# Patient Record
Sex: Female | Born: 1965 | Race: White | Hispanic: No | Marital: Married | Smoking: Never smoker
Health system: Southern US, Community
[De-identification: ages and names within clinical notes are randomized; demographics above are authoritative.]

## PROBLEM LIST (undated history)

## (undated) DIAGNOSIS — Z9889 Other specified postprocedural states: Secondary | ICD-10-CM

## (undated) DIAGNOSIS — I469 Cardiac arrest, cause unspecified: Secondary | ICD-10-CM

## (undated) DIAGNOSIS — R9431 Abnormal electrocardiogram [ECG] [EKG]: Secondary | ICD-10-CM

## (undated) DIAGNOSIS — K621 Rectal polyp: Secondary | ICD-10-CM

## (undated) DIAGNOSIS — R112 Nausea with vomiting, unspecified: Secondary | ICD-10-CM

## (undated) DIAGNOSIS — R011 Cardiac murmur, unspecified: Secondary | ICD-10-CM

## (undated) DIAGNOSIS — G44219 Episodic tension-type headache, not intractable: Secondary | ICD-10-CM

## (undated) HISTORY — DX: Cardiac arrest, cause unspecified: I46.9

## (undated) HISTORY — DX: Abnormal electrocardiogram (ECG) (EKG): R94.31

## (undated) HISTORY — DX: Episodic tension-type headache, not intractable: G44.219

---

## 1997-07-27 HISTORY — PX: BREAST BIOPSY: SHX20

## 1998-04-04 ENCOUNTER — Other Ambulatory Visit: Admission: RE | Admit: 1998-04-04 | Discharge: 1998-04-04 | Payer: Self-pay | Admitting: Obstetrics and Gynecology

## 1999-06-05 ENCOUNTER — Other Ambulatory Visit: Admission: RE | Admit: 1999-06-05 | Discharge: 1999-06-05 | Payer: Self-pay | Admitting: Obstetrics and Gynecology

## 1999-07-18 ENCOUNTER — Ambulatory Visit (HOSPITAL_COMMUNITY): Admission: RE | Admit: 1999-07-18 | Discharge: 1999-07-18 | Payer: Self-pay | Admitting: *Deleted

## 1999-07-18 ENCOUNTER — Encounter: Payer: Self-pay | Admitting: General Surgery

## 2000-06-14 ENCOUNTER — Other Ambulatory Visit: Admission: RE | Admit: 2000-06-14 | Discharge: 2000-06-14 | Payer: Self-pay | Admitting: Obstetrics and Gynecology

## 2000-12-27 ENCOUNTER — Emergency Department (HOSPITAL_COMMUNITY): Admission: EM | Admit: 2000-12-27 | Discharge: 2000-12-27 | Payer: Self-pay | Admitting: Emergency Medicine

## 2001-07-28 ENCOUNTER — Other Ambulatory Visit: Admission: RE | Admit: 2001-07-28 | Discharge: 2001-07-28 | Payer: Self-pay | Admitting: Obstetrics and Gynecology

## 2002-08-08 ENCOUNTER — Other Ambulatory Visit: Admission: RE | Admit: 2002-08-08 | Discharge: 2002-08-08 | Payer: Self-pay | Admitting: Obstetrics and Gynecology

## 2003-10-24 ENCOUNTER — Ambulatory Visit (HOSPITAL_COMMUNITY): Admission: RE | Admit: 2003-10-24 | Discharge: 2003-10-24 | Payer: Self-pay | Admitting: Obstetrics and Gynecology

## 2004-01-09 ENCOUNTER — Inpatient Hospital Stay (HOSPITAL_COMMUNITY): Admission: AD | Admit: 2004-01-09 | Discharge: 2004-01-11 | Payer: Self-pay | Admitting: Obstetrics and Gynecology

## 2004-02-18 ENCOUNTER — Other Ambulatory Visit: Admission: RE | Admit: 2004-02-18 | Discharge: 2004-02-18 | Payer: Self-pay | Admitting: Obstetrics and Gynecology

## 2008-11-24 LAB — HM PAP SMEAR

## 2008-11-24 LAB — HM MAMMOGRAPHY

## 2012-04-19 ENCOUNTER — Ambulatory Visit (INDEPENDENT_AMBULATORY_CARE_PROVIDER_SITE_OTHER): Payer: BC Managed Care – PPO | Admitting: Family Medicine

## 2012-04-19 ENCOUNTER — Encounter: Payer: Self-pay | Admitting: Family Medicine

## 2012-04-19 VITALS — BP 120/82 | HR 70 | Temp 98.6°F | Ht 62.0 in | Wt 152.0 lb

## 2012-04-19 DIAGNOSIS — Z0000 Encounter for general adult medical examination without abnormal findings: Secondary | ICD-10-CM

## 2012-04-19 DIAGNOSIS — Z23 Encounter for immunization: Secondary | ICD-10-CM

## 2012-04-19 DIAGNOSIS — Z8249 Family history of ischemic heart disease and other diseases of the circulatory system: Secondary | ICD-10-CM

## 2012-04-19 DIAGNOSIS — E785 Hyperlipidemia, unspecified: Secondary | ICD-10-CM

## 2012-04-19 LAB — POCT URINALYSIS DIPSTICK
Bilirubin, UA: NEGATIVE
Blood, UA: NEGATIVE
Glucose, UA: NEGATIVE
Ketones, UA: NEGATIVE
Leukocytes, UA: NEGATIVE
Nitrite, UA: NEGATIVE
Spec Grav, UA: >=1.03
Urobilinogen, UA: 0.2
pH, UA: 6.5

## 2012-04-19 LAB — BASIC METABOLIC PANEL
BUN: 12 mg/dL (ref 6–23)
CO2: 26 mEq/L (ref 19–32)
Calcium: 9.3 mg/dL (ref 8.4–10.5)
Chloride: 102 mEq/L (ref 96–112)
Creatinine, Ser: 0.8 mg/dL (ref 0.4–1.2)
GFR: 85.75 mL/min (ref >60.00)
Glucose, Bld: 89 mg/dL (ref 70–99)
Potassium: 4.1 mEq/L (ref 3.5–5.1)
Sodium: 141 mEq/L (ref 135–145)

## 2012-04-19 LAB — HEPATIC FUNCTION PANEL
ALT: 21 U/L (ref 0–35)
AST: 21 U/L (ref 0–37)
Albumin: 4.2 g/dL (ref 3.5–5.2)
Alkaline Phosphatase: 76 U/L (ref 39–117)
Bilirubin, Direct: 0 mg/dL (ref 0.0–0.3)
Total Bilirubin: 0.6 mg/dL (ref 0.3–1.2)
Total Protein: 7.1 g/dL (ref 6.0–8.3)

## 2012-04-19 LAB — CBC WITH DIFFERENTIAL/PLATELET
Basophils Absolute: 0.1 10*3/uL (ref 0.0–0.1)
Basophils Relative: 0.7 % (ref 0.0–3.0)
Eosinophils Absolute: 0.2 10*3/uL (ref 0.0–0.7)
Eosinophils Relative: 2.6 % (ref 0.0–5.0)
HCT: 43.3 % (ref 36.0–46.0)
Hemoglobin: 14.1 g/dL (ref 12.0–15.0)
Lymphocytes Relative: 31 % (ref 12.0–46.0)
Lymphs Abs: 2.2 10*3/uL (ref 0.7–4.0)
MCHC: 32.6 g/dL (ref 30.0–36.0)
MCV: 84.3 fl (ref 78.0–100.0)
Monocytes Absolute: 0.5 10*3/uL (ref 0.1–1.0)
Monocytes Relative: 6.8 % (ref 3.0–12.0)
Neutro Abs: 4.1 10*3/uL (ref 1.4–7.7)
Neutrophils Relative %: 58.9 % (ref 43.0–77.0)
Platelets: 252 10*3/uL (ref 150.0–400.0)
RBC: 5.14 Mil/uL — ABNORMAL HIGH (ref 3.87–5.11)
RDW: 13.9 % (ref 11.5–14.6)
WBC: 7 10*3/uL (ref 4.5–10.5)

## 2012-04-19 LAB — LIPID PANEL
Cholesterol: 183 mg/dL (ref 0–200)
HDL: 34.3 mg/dL — ABNORMAL LOW (ref >39.00)
LDL Cholesterol: 126 mg/dL — ABNORMAL HIGH (ref 0–99)
Total CHOL/HDL Ratio: 5
Triglycerides: 114 mg/dL (ref 0.0–149.0)
VLDL: 22.8 mg/dL (ref 0.0–40.0)

## 2012-04-19 NOTE — Patient Instructions (Addendum)
Preventive Care for Adults, Female A healthy lifestyle and preventive care can promote health and wellness. Preventive health guidelines for women include the following key practices.  A routine yearly physical is a good way to check with your caregiver about your health and preventive screening. It is a chance to share any concerns and updates on your health, and to receive a thorough exam.   Visit your dentist for a routine exam and preventive care every 6 months. Brush your teeth twice a day and floss once a day. Good oral hygiene prevents tooth decay and gum disease.   The frequency of eye exams is based on your age, health, family medical history, use of contact lenses, and other factors. Follow your caregiver's recommendations for frequency of eye exams.   Eat a healthy diet. Foods like vegetables, fruits, whole grains, low-fat dairy products, and lean protein foods contain the nutrients you need without too many calories. Decrease your intake of foods high in solid fats, added sugars, and salt. Eat the right amount of calories for you.Get information about a proper diet from your caregiver, if necessary.   Regular physical exercise is one of the most important things you can do for your health. Most adults should get at least 150 minutes of moderate-intensity exercise (any activity that increases your heart rate and causes you to sweat) each week. In addition, most adults need muscle-strengthening exercises on 2 or more days a week.   Maintain a healthy weight. The body mass index (BMI) is a screening tool to identify possible weight problems. It provides an estimate of body fat based on height and weight. Your caregiver can help determine your BMI, and can help you achieve or maintain a healthy weight.For adults 20 years and older:   A BMI below 18.5 is considered underweight.   A BMI of 18.5 to 24.9 is normal.   A BMI of 25 to 29.9 is considered overweight.   A BMI of 30 and above is  considered obese.   Maintain normal blood lipids and cholesterol levels by exercising and minimizing your intake of saturated fat. Eat a balanced diet with plenty of fruit and vegetables. Blood tests for lipids and cholesterol should begin at age 20 and be repeated every 5 years. If your lipid or cholesterol levels are high, you are over 50, or you are at high risk for heart disease, you may need your cholesterol levels checked more frequently.Ongoing high lipid and cholesterol levels should be treated with medicines if diet and exercise are not effective.   If you smoke, find out from your caregiver how to quit. If you do not use tobacco, do not start.   If you are pregnant, do not drink alcohol. If you are breastfeeding, be very cautious about drinking alcohol. If you are not pregnant and choose to drink alcohol, do not exceed 1 drink per day. One drink is considered to be 12 ounces (355 mL) of beer, 5 ounces (148 mL) of wine, or 1.5 ounces (44 mL) of liquor.   Avoid use of street drugs. Do not share needles with anyone. Ask for help if you need support or instructions about stopping the use of drugs.   High blood pressure causes heart disease and increases the risk of stroke. Your blood pressure should be checked at least every 1 to 2 years. Ongoing high blood pressure should be treated with medicines if weight loss and exercise are not effective.   If you are 55 to 46   years old, ask your caregiver if you should take aspirin to prevent strokes.   Diabetes screening involves taking a blood sample to check your fasting blood sugar level. This should be done once every 3 years, after age 45, if you are within normal weight and without risk factors for diabetes. Testing should be considered at a younger age or be carried out more frequently if you are overweight and have at least 1 risk factor for diabetes.   Breast cancer screening is essential preventive care for women. You should practice "breast  self-awareness." This means understanding the normal appearance and feel of your breasts and may include breast self-examination. Any changes detected, no matter how small, should be reported to a caregiver. Women in their 20s and 30s should have a clinical breast exam (CBE) by a caregiver as part of a regular health exam every 1 to 3 years. After age 40, women should have a CBE every year. Starting at age 40, women should consider having a mammography (breast X-ray test) every year. Women who have a family history of breast cancer should talk to their caregiver about genetic screening. Women at a high risk of breast cancer should talk to their caregivers about having magnetic resonance imaging (MRI) and a mammography every year.   The Pap test is a screening test for cervical cancer. A Pap test can show cell changes on the cervix that might become cervical cancer if left untreated. A Pap test is a procedure in which cells are obtained and examined from the lower end of the uterus (cervix).   Women should have a Pap test starting at age 21.   Between ages 21 and 29, Pap tests should be repeated every 2 years.   Beginning at age 30, you should have a Pap test every 3 years as long as the past 3 Pap tests have been normal.   Some women have medical problems that increase the chance of getting cervical cancer. Talk to your caregiver about these problems. It is especially important to talk to your caregiver if a new problem develops soon after your last Pap test. In these cases, your caregiver may recommend more frequent screening and Pap tests.   The above recommendations are the same for women who have or have not gotten the vaccine for human papillomavirus (HPV).   If you had a hysterectomy for a problem that was not cancer or a condition that could lead to cancer, then you no longer need Pap tests. Even if you no longer need a Pap test, a regular exam is a good idea to make sure no other problems are  starting.   If you are between ages 65 and 70, and you have had normal Pap tests going back 10 years, you no longer need Pap tests. Even if you no longer need a Pap test, a regular exam is a good idea to make sure no other problems are starting.   If you have had past treatment for cervical cancer or a condition that could lead to cancer, you need Pap tests and screening for cancer for at least 20 years after your treatment.   If Pap tests have been discontinued, risk factors (such as a new sexual partner) need to be reassessed to determine if screening should be resumed.   The HPV test is an additional test that may be used for cervical cancer screening. The HPV test looks for the virus that can cause the cell changes on the cervix.   The cells collected during the Pap test can be tested for HPV. The HPV test could be used to screen women aged 30 years and older, and should be used in women of any age who have unclear Pap test results. After the age of 30, women should have HPV testing at the same frequency as a Pap test.   Colorectal cancer can be detected and often prevented. Most routine colorectal cancer screening begins at the age of 50 and continues through age 75. However, your caregiver may recommend screening at an earlier age if you have risk factors for colon cancer. On a yearly basis, your caregiver may provide home test kits to check for hidden blood in the stool. Use of a small camera at the end of a tube, to directly examine the colon (sigmoidoscopy or colonoscopy), can detect the earliest forms of colorectal cancer. Talk to your caregiver about this at age 50, when routine screening begins. Direct examination of the colon should be repeated every 5 to 10 years through age 75, unless early forms of pre-cancerous polyps or small growths are found.   Hepatitis C blood testing is recommended for all people born from 1945 through 1965 and any individual with known risks for hepatitis C.    Practice safe sex. Use condoms and avoid high-risk sexual practices to reduce the spread of sexually transmitted infections (STIs). STIs include gonorrhea, chlamydia, syphilis, trichomonas, herpes, HPV, and human immunodeficiency virus (HIV). Herpes, HIV, and HPV are viral illnesses that have no cure. They can result in disability, cancer, and death. Sexually active women aged 25 and younger should be checked for chlamydia. Older women with new or multiple partners should also be tested for chlamydia. Testing for other STIs is recommended if you are sexually active and at increased risk.   Osteoporosis is a disease in which the bones lose minerals and strength with aging. This can result in serious bone fractures. The risk of osteoporosis can be identified using a bone density scan. Women ages 65 and over and women at risk for fractures or osteoporosis should discuss screening with their caregivers. Ask your caregiver whether you should take a calcium supplement or vitamin D to reduce the rate of osteoporosis.   Menopause can be associated with physical symptoms and risks. Hormone replacement therapy is available to decrease symptoms and risks. You should talk to your caregiver about whether hormone replacement therapy is right for you.   Use sunscreen with sun protection factor (SPF) of 30 or more. Apply sunscreen liberally and repeatedly throughout the day. You should seek shade when your shadow is shorter than you. Protect yourself by wearing long sleeves, pants, a wide-brimmed hat, and sunglasses year round, whenever you are outdoors.   Once a month, do a whole body skin exam, using a mirror to look at the skin on your back. Notify your caregiver of new moles, moles that have irregular borders, moles that are larger than a pencil eraser, or moles that have changed in shape or color.   Stay current with required immunizations.   Influenza. You need a dose every fall (or winter). The composition of  the flu vaccine changes each year, so being vaccinated once is not enough.   Pneumococcal polysaccharide. You need 1 to 2 doses if you smoke cigarettes or if you have certain chronic medical conditions. You need 1 dose at age 65 (or older) if you have never been vaccinated.   Tetanus, diphtheria, pertussis (Tdap, Td). Get 1 dose of   Tdap vaccine if you are younger than age 65, are over 65 and have contact with an infant, are a healthcare worker, are pregnant, or simply want to be protected from whooping cough. After that, you need a Td booster dose every 10 years. Consult your caregiver if you have not had at least 3 tetanus and diphtheria-containing shots sometime in your life or have a deep or dirty wound.   HPV. You need this vaccine if you are a woman age 26 or younger. The vaccine is given in 3 doses over 6 months.   Measles, mumps, rubella (MMR). You need at least 1 dose of MMR if you were born in 1957 or later. You may also need a second dose.   Meningococcal. If you are age 19 to 21 and a first-year college student living in a residence hall, or have one of several medical conditions, you need to get vaccinated against meningococcal disease. You may also need additional booster doses.   Zoster (shingles). If you are age 60 or older, you should get this vaccine.   Varicella (chickenpox). If you have never had chickenpox or you were vaccinated but received only 1 dose, talk to your caregiver to find out if you need this vaccine.   Hepatitis A. You need this vaccine if you have a specific risk factor for hepatitis A virus infection or you simply wish to be protected from this disease. The vaccine is usually given as 2 doses, 6 to 18 months apart.   Hepatitis B. You need this vaccine if you have a specific risk factor for hepatitis B virus infection or you simply wish to be protected from this disease. The vaccine is given in 3 doses, usually over 6 months.  Preventive Services /  Frequency Ages 19 to 39  Blood pressure check.** / Every 1 to 2 years.   Lipid and cholesterol check.** / Every 5 years beginning at age 20.   Clinical breast exam.** / Every 3 years for women in their 20s and 30s.   Pap test.** / Every 2 years from ages 21 through 29. Every 3 years starting at age 30 through age 65 or 70 with a history of 3 consecutive normal Pap tests.   HPV screening.** / Every 3 years from ages 30 through ages 65 to 70 with a history of 3 consecutive normal Pap tests.   Hepatitis C blood test.** / For any individual with known risks for hepatitis C.   Skin self-exam. / Monthly.   Influenza immunization.** / Every year.   Pneumococcal polysaccharide immunization.** / 1 to 2 doses if you smoke cigarettes or if you have certain chronic medical conditions.   Tetanus, diphtheria, pertussis (Tdap, Td) immunization. / A one-time dose of Tdap vaccine. After that, you need a Td booster dose every 10 years.   HPV immunization. / 3 doses over 6 months, if you are 26 and younger.   Measles, mumps, rubella (MMR) immunization. / You need at least 1 dose of MMR if you were born in 1957 or later. You may also need a second dose.   Meningococcal immunization. / 1 dose if you are age 19 to 21 and a first-year college student living in a residence hall, or have one of several medical conditions, you need to get vaccinated against meningococcal disease. You may also need additional booster doses.   Varicella immunization.** / Consult your caregiver.   Hepatitis A immunization.** / Consult your caregiver. 2 doses, 6 to 18 months   apart.   Hepatitis B immunization.** / Consult your caregiver. 3 doses usually over 6 months.  Ages 40 to 64  Blood pressure check.** / Every 1 to 2 years.   Lipid and cholesterol check.** / Every 5 years beginning at age 20.   Clinical breast exam.** / Every year after age 40.   Mammogram.** / Every year beginning at age 40 and continuing for as  long as you are in good health. Consult with your caregiver.   Pap test.** / Every 3 years starting at age 30 through age 65 or 70 with a history of 3 consecutive normal Pap tests.   HPV screening.** / Every 3 years from ages 30 through ages 65 to 70 with a history of 3 consecutive normal Pap tests.   Fecal occult blood test (FOBT) of stool. / Every year beginning at age 50 and continuing until age 75. You may not need to do this test if you get a colonoscopy every 10 years.   Flexible sigmoidoscopy or colonoscopy.** / Every 5 years for a flexible sigmoidoscopy or every 10 years for a colonoscopy beginning at age 50 and continuing until age 75.   Hepatitis C blood test.** / For all people born from 1945 through 1965 and any individual with known risks for hepatitis C.   Skin self-exam. / Monthly.   Influenza immunization.** / Every year.   Pneumococcal polysaccharide immunization.** / 1 to 2 doses if you smoke cigarettes or if you have certain chronic medical conditions.   Tetanus, diphtheria, pertussis (Tdap, Td) immunization.** / A one-time dose of Tdap vaccine. After that, you need a Td booster dose every 10 years.   Measles, mumps, rubella (MMR) immunization. / You need at least 1 dose of MMR if you were born in 1957 or later. You may also need a second dose.   Varicella immunization.** / Consult your caregiver.   Meningococcal immunization.** / Consult your caregiver.   Hepatitis A immunization.** / Consult your caregiver. 2 doses, 6 to 18 months apart.   Hepatitis B immunization.** / Consult your caregiver. 3 doses, usually over 6 months.  Ages 65 and over  Blood pressure check.** / Every 1 to 2 years.   Lipid and cholesterol check.** / Every 5 years beginning at age 20.   Clinical breast exam.** / Every year after age 40.   Mammogram.** / Every year beginning at age 40 and continuing for as long as you are in good health. Consult with your caregiver.   Pap test.** /  Every 3 years starting at age 30 through age 65 or 70 with a 3 consecutive normal Pap tests. Testing can be stopped between 65 and 70 with 3 consecutive normal Pap tests and no abnormal Pap or HPV tests in the past 10 years.   HPV screening.** / Every 3 years from ages 30 through ages 65 or 70 with a history of 3 consecutive normal Pap tests. Testing can be stopped between 65 and 70 with 3 consecutive normal Pap tests and no abnormal Pap or HPV tests in the past 10 years.   Fecal occult blood test (FOBT) of stool. / Every year beginning at age 50 and continuing until age 75. You may not need to do this test if you get a colonoscopy every 10 years.   Flexible sigmoidoscopy or colonoscopy.** / Every 5 years for a flexible sigmoidoscopy or every 10 years for a colonoscopy beginning at age 50 and continuing until age 75.   Hepatitis   C blood test.** / For all people born from 1945 through 1965 and any individual with known risks for hepatitis C.   Osteoporosis screening.** / A one-time screening for women ages 65 and over and women at risk for fractures or osteoporosis.   Skin self-exam. / Monthly.   Influenza immunization.** / Every year.   Pneumococcal polysaccharide immunization.** / 1 dose at age 65 (or older) if you have never been vaccinated.   Tetanus, diphtheria, pertussis (Tdap, Td) immunization. / A one-time dose of Tdap vaccine if you are over 65 and have contact with an infant, are a healthcare worker, or simply want to be protected from whooping cough. After that, you need a Td booster dose every 10 years.   Varicella immunization.** / Consult your caregiver.   Meningococcal immunization.** / Consult your caregiver.   Hepatitis A immunization.** / Consult your caregiver. 2 doses, 6 to 18 months apart.   Hepatitis B immunization.** / Check with your caregiver. 3 doses, usually over 6 months.  ** Family history and personal history of risk and conditions may change your caregiver's  recommendations. Document Released: 09/08/2001 Document Revised: 07/02/2011 Document Reviewed: 12/08/2010 [employer] Patient Information 2012 [employer]. 

## 2012-04-19 NOTE — Progress Notes (Signed)
Subjective:     Teresa Henry is a 46 y.o. female and is here for a comprehensive physical exam. The patient reports no problems. Her gyn told her she needed a pcp--- her LDL 2010 118.    History   Social History  . Marital Status: Single    Spouse Name: N/A    Number of Children: N/A  . Years of Education: N/A   Occupational History  . Not on file.   Social History Main Topics  . Smoking status: Never Smoker   . Smokeless tobacco: Never Used  . Alcohol Use: No  . Drug Use: No  . Sexually Active: Yes   Other Topics Concern  . Not on file   Social History Narrative  . No narrative on file   No health maintenance topics applied.  The following portions of the patient's history were reviewed and updated as appropriate:  She  has a past medical history of Frequent episodic tension-type headache. She  does not have a problem list on file. She  has past surgical history that includes Breast biopsy (1999). Her family history includes Arthritis in her father and mother; Heart attack (age of onset:47) in her brother; Hypertension in her father and mother; and Stroke in her maternal grandmother and paternal grandmother. She  reports that she has never smoked. She has never used smokeless tobacco. She reports that she does not drink alcohol or use illicit drugs. She currently has no medications in their medication list. No current outpatient prescriptions on file prior to visit.   She  has no known allergies..  Review of Systems Review of Systems  Constitutional: Negative for activity change, appetite change and fatigue.  HENT: Negative for hearing loss, congestion, tinnitus and ear discharge.  dentist q76m Eyes: Negative for visual disturbance (see optho q1y -- vision corrected to 20/20 with glasses).  Respiratory: Negative for cough, chest tightness and shortness of breath.   Cardiovascular: Negative for chest pain, palpitations and leg swelling.  Gastrointestinal: Negative  for abdominal pain, diarrhea, constipation and abdominal distention.  Genitourinary: Negative for urgency, frequency, decreased urine volume and difficulty urinating.  Musculoskeletal: Negative for back pain, arthralgias and gait problem.  Skin: Negative for color change, pallor and rash.  Neurological: Negative for dizziness, light-headedness, numbness and headaches.  Hematological: Negative for adenopathy. Does not bruise/bleed easily.  Psychiatric/Behavioral: Negative for suicidal ideas, confusion, sleep disturbance, self-injury, dysphoric mood, decreased concentration and agitation.       Objective:    BP 120/82  Pulse 70  Temp 98.6 F (37 C) (Oral)  Ht 5\' 2"  (1.575 m)  Wt 152 lb (68.947 kg)  BMI 27.80 kg/m2  SpO2 98%  LMP 04/14/2012 General appearance: alert, cooperative, appears stated age and no distress Head: Normocephalic, without obvious abnormality, atraumatic Eyes: conjunctivae/corneas clear. PERRL, EOM's intact. Fundi benign. Ears: normal TM's and external ear canals both ears Nose: Nares normal. Septum midline. Mucosa normal. No drainage or sinus tenderness. Throat: lips, mucosa, and tongue normal; teeth and gums normal Neck: no adenopathy, no carotid bruit, no JVD, supple, symmetrical, trachea midline and thyroid not enlarged, symmetric, no tenderness/mass/nodules Back: symmetric, no curvature. ROM normal. No CVA tenderness. Lungs: clear to auscultation bilaterally Breasts: normal appearance, no masses or tenderness Heart: regular rate and rhythm, S1, S2 normal, no murmur, click, rub or gallop Abdomen: soft, non-tender; bowel sounds normal; no masses,  no organomegaly Pelvic: cervix normal in appearance, external genitalia normal, no adnexal masses or tenderness, no cervical motion tenderness,  rectovaginal septum normal, uterus normal size, shape, and consistency and vagina normal without discharge Extremities: extremities normal, atraumatic, no cyanosis or  edema Pulses: 2+ and symmetric Skin: Skin color, texture, turgor normal. No rashes or lesions Lymph nodes: Cervical, supraclavicular, and axillary nodes normal. Neurologic: Alert and oriented X 3, normal strength and tone. Normal symmetric reflexes. Normal coordination and gait Psych-- no depresssion , anxiety   Assessment:    Healthy female exam.      Plan:  ghm utd Check labs   See After Visit Summary for Counseling Recommendations

## 2012-04-20 LAB — TSH: TSH: 1.02 u[IU]/mL (ref 0.35–5.50)

## 2012-04-21 LAB — NMR LIPOPROFILE WITHOUT LIPIDS
LDL Particle Number: 1383 nmol/L — ABNORMAL HIGH (ref <1000)
LP-IR Score: 60 — ABNORMAL HIGH (ref <=45)
Small LDL Particle Number: 546 nmol/L — ABNORMAL HIGH (ref <=527)

## 2012-04-22 ENCOUNTER — Telehealth: Payer: Self-pay

## 2012-04-25 ENCOUNTER — Encounter: Payer: Self-pay | Admitting: Family Medicine

## 2012-04-28 ENCOUNTER — Other Ambulatory Visit: Payer: Self-pay | Admitting: Obstetrics and Gynecology

## 2012-04-28 DIAGNOSIS — R928 Other abnormal and inconclusive findings on diagnostic imaging of breast: Secondary | ICD-10-CM

## 2012-04-29 NOTE — Telephone Encounter (Signed)
error 

## 2012-05-03 ENCOUNTER — Ambulatory Visit
Admission: RE | Admit: 2012-05-03 | Discharge: 2012-05-03 | Disposition: A | Discharge status: Home / Self Care | Payer: BC Managed Care – PPO | Source: Ambulatory Visit | Attending: Obstetrics and Gynecology | Admitting: Obstetrics and Gynecology

## 2012-05-03 DIAGNOSIS — R928 Other abnormal and inconclusive findings on diagnostic imaging of breast: Secondary | ICD-10-CM

## 2012-09-10 ENCOUNTER — Other Ambulatory Visit: Payer: Self-pay

## 2012-11-25 ENCOUNTER — Ambulatory Visit (INDEPENDENT_AMBULATORY_CARE_PROVIDER_SITE_OTHER): Payer: BC Managed Care – PPO | Admitting: Family Medicine

## 2012-11-25 ENCOUNTER — Telehealth: Payer: Self-pay | Admitting: Family Medicine

## 2012-11-25 ENCOUNTER — Encounter: Payer: Self-pay | Admitting: Family Medicine

## 2012-11-25 DIAGNOSIS — S40022A Contusion of left upper arm, initial encounter: Secondary | ICD-10-CM

## 2012-11-25 DIAGNOSIS — M79609 Pain in unspecified limb: Secondary | ICD-10-CM

## 2012-11-25 DIAGNOSIS — S40029A Contusion of unspecified upper arm, initial encounter: Secondary | ICD-10-CM

## 2012-11-25 NOTE — Progress Notes (Signed)
  Subjective:    Patient ID: Teresa Henry, female    DOB: Aug 02, 1965, 47 y.o.   MRN: [id number]  HPI Pt here c/o bruise/ pain L forearm after running into wooden fence post while trying to walk 85 lb dog.  It hurt a lot last night. But has eased today.    Review of Systems As above    Objective:   Physical Exam  BP 114/74  Pulse 86  Temp(Src) 99.1 F (37.3 C) (Oral)  Wt 143 lb (64.864 kg)  BMI 26.15 kg/m2  SpO2 99% General appearance: alert, cooperative, appears stated age and no distress Extremities: L forearm----+ swelling and ecchymosis resolving, tender to touch, no deformity      Assessment & Plan:

## 2012-11-25 NOTE — Telephone Encounter (Signed)
Patient Information:  Caller Name: Haislee  Phone: [phone]  Patient: Teresa Henry, Teresa Henry  Gender: Female  DOB: 22-Oct-1965  Age: 47 Years  PCP: Lelon Perla.  Pregnant: No  Office Follow Up:  Does the office need to follow up with this patient?: No  Instructions For The Office: N/A  RN Note:  pt was concerned about the possibility of fracture; pt asked if xrays were done at the office, RN told pt we did not do xrays but office liked to evaluate the needs for xrays in the office before referring pt out.  Pt was agreeable to being seen.  Symptoms  Reason For Call & Symptoms: collided with a gate post.  Injured left arm (around wrist, 2 inches above).  there is swelling.  Pt rates her pain as mild.  Pt is able to use her hand normally  And can move her wrist.  Reviewed Health History In EMR: Yes  Reviewed Medications In EMR: Yes  Reviewed Allergies In EMR: Yes  Reviewed Surgeries / Procedures: Yes  Date of Onset of Symptoms: 11/24/2012  Treatments Tried: Ibuprofen, ice  Treatments Tried Worked: No OB / GYN:  LMP: 11/06/2012  Guideline(s) Used:  Arm Injury  Disposition Per Guideline:   Home Care  Reason For Disposition Reached:   Minor injury or bruising from direct blow  Advice Given:  Reassurance - Direct Blow (Contusion, Bruise)  A direct blow to your arm can cause a contusion. Contusion is the medical term for bruise.  Symptoms are mild pain, swelling, and/or bruising.  Here is some care advice that should help.  Apply a Cold Pack:  Apply a cold pack or an ice bag (wrapped in a moist towel) to the area for 20 minutes. Repeat in 1 hour, then every 4 hours while awake.  Rest vs. Movement:  Continue normal activities as much as your pain permits.  Avoid heavy lifting and active sports for 1-2 weeks or until the pain and swelling are gone.  Call Back If:  Pain becomes severe  You become worse.  RN Overrode Recommendation:  Make Appointment  pt would like to be  seen  Appointment Scheduled:  11/25/2012 11:15:00 Appointment Scheduled Provider:  Lelon Perla.

## 2012-11-25 NOTE — Patient Instructions (Addendum)

## 2012-11-25 NOTE — Assessment & Plan Note (Signed)
Ice alt heat Ace Rest Xray if no better Offered pain med for night but symptoms have improved

## 2013-05-01 LAB — HM PAP SMEAR

## 2013-05-11 ENCOUNTER — Telehealth: Payer: Self-pay

## 2013-05-11 NOTE — Telephone Encounter (Addendum)
Medication and allergies: done  90 day supply/mail order: none  Local pharmacy: [facility] on [address]   Immunizations due:  Flu vaccine offered  A/P:  Last:  PAP:  MMG: Due 04/2012  Dexa:   CCS: na  DM: Due 03/2012 Eye Exam: na  HTN: Due 03/2012 Lipids: Due 03/2012  Recent family history or surgical procedures:none  To Discuss with Provider:none

## 2013-05-15 ENCOUNTER — Encounter: Payer: Self-pay | Admitting: Family Medicine

## 2013-05-15 ENCOUNTER — Ambulatory Visit (INDEPENDENT_AMBULATORY_CARE_PROVIDER_SITE_OTHER): Payer: BC Managed Care – PPO | Admitting: Family Medicine

## 2013-05-15 VITALS — BP 118/64 | HR 60 | Temp 98.5°F | Ht 62.0 in | Wt 143.4 lb

## 2013-05-15 DIAGNOSIS — R319 Hematuria, unspecified: Secondary | ICD-10-CM

## 2013-05-15 DIAGNOSIS — Z0000 Encounter for general adult medical examination without abnormal findings: Secondary | ICD-10-CM

## 2013-05-15 LAB — POCT URINALYSIS DIPSTICK
Bilirubin, UA: NEGATIVE
Glucose, UA: NEGATIVE
Ketones, UA: NEGATIVE
Leukocytes, UA: NEGATIVE
Nitrite, UA: NEGATIVE
Protein, UA: NEGATIVE
Urobilinogen, UA: 0.2

## 2013-05-15 LAB — HEPATIC FUNCTION PANEL
ALT: 40 U/L — ABNORMAL HIGH (ref 0–35)
AST: 31 U/L (ref 0–37)
Albumin: 4.4 g/dL (ref 3.5–5.2)
Alkaline Phosphatase: 86 U/L (ref 39–117)
Bilirubin, Direct: 0.1 mg/dL (ref 0.0–0.3)
Total Bilirubin: 0.7 mg/dL (ref 0.3–1.2)
Total Protein: 7.3 g/dL (ref 6.0–8.3)

## 2013-05-15 LAB — CBC WITH DIFFERENTIAL/PLATELET
Basophils Relative: 0.6 % (ref 0.0–3.0)
Eosinophils Relative: 3.2 % (ref 0.0–5.0)
Lymphocytes Relative: 34.4 % (ref 12.0–46.0)
MCHC: 33.7 g/dL (ref 30.0–36.0)
MCV: 83.8 fl (ref 78.0–100.0)
Monocytes Absolute: 0.5 10*3/uL (ref 0.1–1.0)
Monocytes Relative: 7 % (ref 3.0–12.0)
Neutrophils Relative %: 54.8 % (ref 43.0–77.0)
RBC: 5.19 Mil/uL — ABNORMAL HIGH (ref 3.87–5.11)
WBC: 7.6 10*3/uL (ref 4.5–10.5)

## 2013-05-15 LAB — BASIC METABOLIC PANEL
Calcium: 9.6 mg/dL (ref 8.4–10.5)
Chloride: 102 mEq/L (ref 96–112)
Creatinine, Ser: 0.7 mg/dL (ref 0.4–1.2)
GFR: 92.23 mL/min (ref >60.00)

## 2013-05-15 LAB — LIPID PANEL
LDL Cholesterol: 117 mg/dL — ABNORMAL HIGH (ref 0–99)
Total CHOL/HDL Ratio: 4
Triglycerides: 99 mg/dL (ref 0.0–149.0)

## 2013-05-15 NOTE — Patient Instructions (Signed)
Preventive Care for Adults, Female A healthy lifestyle and preventive care can promote health and wellness. Preventive health guidelines for women include the following key practices.  A routine yearly physical is a good way to check with your caregiver about your health and preventive screening. It is a chance to share any concerns and updates on your health, and to receive a thorough exam.  Visit your dentist for a routine exam and preventive care every 6 months. Brush your teeth twice a day and floss once a day. Good oral hygiene prevents tooth decay and gum disease.  The frequency of eye exams is based on your age, health, family medical history, use of contact lenses, and other factors. Follow your caregiver's recommendations for frequency of eye exams.  Eat a healthy diet. Foods like vegetables, fruits, whole grains, low-fat dairy products, and lean protein foods contain the nutrients you need without too many calories. Decrease your intake of foods high in solid fats, added sugars, and salt. Eat the right amount of calories for you.Get information about a proper diet from your caregiver, if necessary.  Regular physical exercise is one of the most important things you can do for your health. Most adults should get at least 150 minutes of moderate-intensity exercise (any activity that increases your heart rate and causes you to sweat) each week. In addition, most adults need muscle-strengthening exercises on 2 or more days a week.  Maintain a healthy weight. The body mass index (BMI) is a screening tool to identify possible weight problems. It provides an estimate of body fat based on height and weight. Your caregiver can help determine your BMI, and can help you achieve or maintain a healthy weight.For adults 20 years and older:  A BMI below 18.5 is considered underweight.  A BMI of 18.5 to 24.9 is normal.  A BMI of 25 to 29.9 is considered overweight.  A BMI of 30 and above is  considered obese.  Maintain normal blood lipids and cholesterol levels by exercising and minimizing your intake of saturated fat. Eat a balanced diet with plenty of fruit and vegetables. Blood tests for lipids and cholesterol should begin at age 20 and be repeated every 5 years. If your lipid or cholesterol levels are high, you are over 50, or you are at high risk for heart disease, you may need your cholesterol levels checked more frequently.Ongoing high lipid and cholesterol levels should be treated with medicines if diet and exercise are not effective.  If you smoke, find out from your caregiver how to quit. If you do not use tobacco, do not start.  If you are pregnant, do not drink alcohol. If you are breastfeeding, be very cautious about drinking alcohol. If you are not pregnant and choose to drink alcohol, do not exceed 1 drink per day. One drink is considered to be 12 ounces (355 mL) of beer, 5 ounces (148 mL) of wine, or 1.5 ounces (44 mL) of liquor.  Avoid use of street drugs. Do not share needles with anyone. Ask for help if you need support or instructions about stopping the use of drugs.  High blood pressure causes heart disease and increases the risk of stroke. Your blood pressure should be checked at least every 1 to 2 years. Ongoing high blood pressure should be treated with medicines if weight loss and exercise are not effective.  If you are 55 to 47 years old, ask your caregiver if you should take aspirin to prevent strokes.  Diabetes   screening involves taking a blood sample to check your fasting blood sugar level. This should be done once every 3 years, after age 45, if you are within normal weight and without risk factors for diabetes. Testing should be considered at a younger age or be carried out more frequently if you are overweight and have at least 1 risk factor for diabetes.  Breast cancer screening is essential preventive care for women. You should practice "breast  self-awareness." This means understanding the normal appearance and feel of your breasts and may include breast self-examination. Any changes detected, no matter how small, should be reported to a caregiver. Women in their 20s and 30s should have a clinical breast exam (CBE) by a caregiver as part of a regular health exam every 1 to 3 years. After age 40, women should have a CBE every year. Starting at age 40, women should consider having a mammography (breast X-ray test) every year. Women who have a family history of breast cancer should talk to their caregiver about genetic screening. Women at a high risk of breast cancer should talk to their caregivers about having magnetic resonance imaging (MRI) and a mammography every year.  The Pap test is a screening test for cervical cancer. A Pap test can show cell changes on the cervix that might become cervical cancer if left untreated. A Pap test is a procedure in which cells are obtained and examined from the lower end of the uterus (cervix).  Women should have a Pap test starting at age 21.  Between ages 21 and 29, Pap tests should be repeated every 2 years.  Beginning at age 30, you should have a Pap test every 3 years as long as the past 3 Pap tests have been normal.  Some women have medical problems that increase the chance of getting cervical cancer. Talk to your caregiver about these problems. It is especially important to talk to your caregiver if a new problem develops soon after your last Pap test. In these cases, your caregiver may recommend more frequent screening and Pap tests.  The above recommendations are the same for women who have or have not gotten the vaccine for human papillomavirus (HPV).  If you had a hysterectomy for a problem that was not cancer or a condition that could lead to cancer, then you no longer need Pap tests. Even if you no longer need a Pap test, a regular exam is a good idea to make sure no other problems are  starting.  If you are between ages 65 and 70, and you have had normal Pap tests going back 10 years, you no longer need Pap tests. Even if you no longer need a Pap test, a regular exam is a good idea to make sure no other problems are starting.  If you have had past treatment for cervical cancer or a condition that could lead to cancer, you need Pap tests and screening for cancer for at least 20 years after your treatment.  If Pap tests have been discontinued, risk factors (such as a new sexual partner) need to be reassessed to determine if screening should be resumed.  The HPV test is an additional test that may be used for cervical cancer screening. The HPV test looks for the virus that can cause the cell changes on the cervix. The cells collected during the Pap test can be tested for HPV. The HPV test could be used to screen women aged 30 years and older, and should   be used in women of any age who have unclear Pap test results. After the age of 30, women should have HPV testing at the same frequency as a Pap test.  Colorectal cancer can be detected and often prevented. Most routine colorectal cancer screening begins at the age of 50 and continues through age 75. However, your caregiver may recommend screening at an earlier age if you have risk factors for colon cancer. On a yearly basis, your caregiver may provide home test kits to check for hidden blood in the stool. Use of a small camera at the end of a tube, to directly examine the colon (sigmoidoscopy or colonoscopy), can detect the earliest forms of colorectal cancer. Talk to your caregiver about this at age 50, when routine screening begins. Direct examination of the colon should be repeated every 5 to 10 years through age 75, unless early forms of pre-cancerous polyps or small growths are found.  Hepatitis C blood testing is recommended for all people born from 1945 through 1965 and any individual with known risks for hepatitis C.  Practice  safe sex. Use condoms and avoid high-risk sexual practices to reduce the spread of sexually transmitted infections (STIs). STIs include gonorrhea, chlamydia, syphilis, trichomonas, herpes, HPV, and human immunodeficiency virus (HIV). Herpes, HIV, and HPV are viral illnesses that have no cure. They can result in disability, cancer, and death. Sexually active women aged 25 and younger should be checked for chlamydia. Older women with new or multiple partners should also be tested for chlamydia. Testing for other STIs is recommended if you are sexually active and at increased risk.  Osteoporosis is a disease in which the bones lose minerals and strength with aging. This can result in serious bone fractures. The risk of osteoporosis can be identified using a bone density scan. Women ages 65 and over and women at risk for fractures or osteoporosis should discuss screening with their caregivers. Ask your caregiver whether you should take a calcium supplement or vitamin D to reduce the rate of osteoporosis.  Menopause can be associated with physical symptoms and risks. Hormone replacement therapy is available to decrease symptoms and risks. You should talk to your caregiver about whether hormone replacement therapy is right for you.  Use sunscreen with sun protection factor (SPF) of 30 or more. Apply sunscreen liberally and repeatedly throughout the day. You should seek shade when your shadow is shorter than you. Protect yourself by wearing long sleeves, pants, a wide-brimmed hat, and sunglasses year round, whenever you are outdoors.  Once a month, do a whole body skin exam, using a mirror to look at the skin on your back. Notify your caregiver of new moles, moles that have irregular borders, moles that are larger than a pencil eraser, or moles that have changed in shape or color.  Stay current with required immunizations.  Influenza. You need a dose every fall (or winter). The composition of the flu vaccine  changes each year, so being vaccinated once is not enough.  Pneumococcal polysaccharide. You need 1 to 2 doses if you smoke cigarettes or if you have certain chronic medical conditions. You need 1 dose at age 65 (or older) if you have never been vaccinated.  Tetanus, diphtheria, pertussis (Tdap, Td). Get 1 dose of Tdap vaccine if you are younger than age 65, are over 65 and have contact with an infant, are a healthcare worker, are pregnant, or simply want to be protected from whooping cough. After that, you need a Td   booster dose every 10 years. Consult your caregiver if you have not had at least 3 tetanus and diphtheria-containing shots sometime in your life or have a deep or dirty wound.  HPV. You need this vaccine if you are a woman age 26 or younger. The vaccine is given in 3 doses over 6 months.  Measles, mumps, rubella (MMR). You need at least 1 dose of MMR if you were born in 1957 or later. You may also need a second dose.  Meningococcal. If you are age 19 to 21 and a first-year college student living in a residence hall, or have one of several medical conditions, you need to get vaccinated against meningococcal disease. You may also need additional booster doses.  Zoster (shingles). If you are age 60 or older, you should get this vaccine.  Varicella (chickenpox). If you have never had chickenpox or you were vaccinated but received only 1 dose, talk to your caregiver to find out if you need this vaccine.  Hepatitis A. You need this vaccine if you have a specific risk factor for hepatitis A virus infection or you simply wish to be protected from this disease. The vaccine is usually given as 2 doses, 6 to 18 months apart.  Hepatitis B. You need this vaccine if you have a specific risk factor for hepatitis B virus infection or you simply wish to be protected from this disease. The vaccine is given in 3 doses, usually over 6 months. Preventive Services / Frequency Ages 19 to 39  Blood  pressure check.** / Every 1 to 2 years.  Lipid and cholesterol check.** / Every 5 years beginning at age 20.  Clinical breast exam.** / Every 3 years for women in their 20s and 30s.  Pap test.** / Every 2 years from ages 21 through 29. Every 3 years starting at age 30 through age 65 or 70 with a history of 3 consecutive normal Pap tests.  HPV screening.** / Every 3 years from ages 30 through ages 65 to 70 with a history of 3 consecutive normal Pap tests.  Hepatitis C blood test.** / For any individual with known risks for hepatitis C.  Skin self-exam. / Monthly.  Influenza immunization.** / Every year.  Pneumococcal polysaccharide immunization.** / 1 to 2 doses if you smoke cigarettes or if you have certain chronic medical conditions.  Tetanus, diphtheria, pertussis (Tdap, Td) immunization. / A one-time dose of Tdap vaccine. After that, you need a Td booster dose every 10 years.  HPV immunization. / 3 doses over 6 months, if you are 26 and younger.  Measles, mumps, rubella (MMR) immunization. / You need at least 1 dose of MMR if you were born in 1957 or later. You may also need a second dose.  Meningococcal immunization. / 1 dose if you are age 19 to 21 and a first-year college student living in a residence hall, or have one of several medical conditions, you need to get vaccinated against meningococcal disease. You may also need additional booster doses.  Varicella immunization.** / Consult your caregiver.  Hepatitis A immunization.** / Consult your caregiver. 2 doses, 6 to 18 months apart.  Hepatitis B immunization.** / Consult your caregiver. 3 doses usually over 6 months. Ages 40 to 64  Blood pressure check.** / Every 1 to 2 years.  Lipid and cholesterol check.** / Every 5 years beginning at age 20.  Clinical breast exam.** / Every year after age 40.  Mammogram.** / Every year beginning at age 40   and continuing for as long as you are in good health. Consult with your  caregiver.  Pap test.** / Every 3 years starting at age 30 through age 65 or 70 with a history of 3 consecutive normal Pap tests.  HPV screening.** / Every 3 years from ages 30 through ages 65 to 70 with a history of 3 consecutive normal Pap tests.  Fecal occult blood test (FOBT) of stool. / Every year beginning at age 50 and continuing until age 75. You may not need to do this test if you get a colonoscopy every 10 years.  Flexible sigmoidoscopy or colonoscopy.** / Every 5 years for a flexible sigmoidoscopy or every 10 years for a colonoscopy beginning at age 50 and continuing until age 75.  Hepatitis C blood test.** / For all people born from 1945 through 1965 and any individual with known risks for hepatitis C.  Skin self-exam. / Monthly.  Influenza immunization.** / Every year.  Pneumococcal polysaccharide immunization.** / 1 to 2 doses if you smoke cigarettes or if you have certain chronic medical conditions.  Tetanus, diphtheria, pertussis (Tdap, Td) immunization.** / A one-time dose of Tdap vaccine. After that, you need a Td booster dose every 10 years.  Measles, mumps, rubella (MMR) immunization. / You need at least 1 dose of MMR if you were born in 1957 or later. You may also need a second dose.  Varicella immunization.** / Consult your caregiver.  Meningococcal immunization.** / Consult your caregiver.  Hepatitis A immunization.** / Consult your caregiver. 2 doses, 6 to 18 months apart.  Hepatitis B immunization.** / Consult your caregiver. 3 doses, usually over 6 months. Ages 65 and over  Blood pressure check.** / Every 1 to 2 years.  Lipid and cholesterol check.** / Every 5 years beginning at age 20.  Clinical breast exam.** / Every year after age 40.  Mammogram.** / Every year beginning at age 40 and continuing for as long as you are in good health. Consult with your caregiver.  Pap test.** / Every 3 years starting at age 30 through age 65 or 70 with a 3  consecutive normal Pap tests. Testing can be stopped between 65 and 70 with 3 consecutive normal Pap tests and no abnormal Pap or HPV tests in the past 10 years.  HPV screening.** / Every 3 years from ages 30 through ages 65 or 70 with a history of 3 consecutive normal Pap tests. Testing can be stopped between 65 and 70 with 3 consecutive normal Pap tests and no abnormal Pap or HPV tests in the past 10 years.  Fecal occult blood test (FOBT) of stool. / Every year beginning at age 50 and continuing until age 75. You may not need to do this test if you get a colonoscopy every 10 years.  Flexible sigmoidoscopy or colonoscopy.** / Every 5 years for a flexible sigmoidoscopy or every 10 years for a colonoscopy beginning at age 50 and continuing until age 75.  Hepatitis C blood test.** / For all people born from 1945 through 1965 and any individual with known risks for hepatitis C.  Osteoporosis screening.** / A one-time screening for women ages 65 and over and women at risk for fractures or osteoporosis.  Skin self-exam. / Monthly.  Influenza immunization.** / Every year.  Pneumococcal polysaccharide immunization.** / 1 dose at age 65 (or older) if you have never been vaccinated.  Tetanus, diphtheria, pertussis (Tdap, Td) immunization. / A one-time dose of Tdap vaccine if you are over   65 and have contact with an infant, are a healthcare worker, or simply want to be protected from whooping cough. After that, you need a Td booster dose every 10 years.  Varicella immunization.** / Consult your caregiver.  Meningococcal immunization.** / Consult your caregiver.  Hepatitis A immunization.** / Consult your caregiver. 2 doses, 6 to 18 months apart.  Hepatitis B immunization.** / Check with your caregiver. 3 doses, usually over 6 months. ** Family history and personal history of risk and conditions may change your caregiver's recommendations. Document Released: 09/08/2001 Document Revised: 10/05/2011  Document Reviewed: 12/08/2010 [employer] Patient Information 2014 [employer]  

## 2013-05-15 NOTE — Addendum Note (Signed)
Addended by: Silvio Pate D on: 05/15/2013 02:19 PM   Modules accepted: Orders

## 2013-05-15 NOTE — Progress Notes (Signed)
Subjective:     Teresa Henry is a 47 y.o. female and is here for a comprehensive physical exam. The patient reports no problems.  History   Social History  . Marital Status: Married    Spouse Name: N/A    Number of Children: N/A  . Years of Education: N/A   Occupational History  . Warehouse manager-- self employed    Social History Main Topics  . Smoking status: Never Smoker   . Smokeless tobacco: Never Used  . Alcohol Use: No  . Drug Use: No  . Sexual Activity: Yes    Partners: Male   Other Topics Concern  . Not on file   Social History Narrative  . No narrative on file   Health Maintenance  Topic Date Due  . Influenza Vaccine  02/24/2014  . Mammogram  05/01/2014  . Pap Smear  05/01/2014  . Tetanus/tdap  04/19/2022    The following portions of the patient's history were reviewed and updated as appropriate:  She  has a past medical history of Frequent episodic tension-type headache. She  does not have any pertinent problems on file. She  has past surgical history that includes Breast biopsy (1999). Her family history includes Arthritis in her father and mother; Heart attack (age of onset: 55) in her brother; Hypertension in her father and mother; Stroke in her maternal grandmother and paternal grandmother. She  reports that she has never smoked. She has never used smokeless tobacco. She reports that she does not drink alcohol or use illicit drugs. She has a current medication list which includes the following prescription(s): aspirin and krill oil. Current Outpatient Prescriptions on File Prior to Visit  Medication Sig Dispense Refill  . aspirin 81 MG tablet Take 81 mg by mouth daily.       No current facility-administered medications on file prior to visit.   She has No Known Allergies..  Review of Systems Review of Systems  Constitutional: Negative for activity change, appetite change and fatigue.  HENT: Negative for hearing loss, congestion, tinnitus  and ear discharge.  dentist q66m Eyes: Negative for visual disturbance (see optho q1y -- vision corrected to 20/20 with glasses).  Respiratory: Negative for cough, chest tightness and shortness of breath.   Cardiovascular: Negative for chest pain, palpitations and leg swelling.  Gastrointestinal: Negative for abdominal pain, diarrhea, constipation and abdominal distention.  Genitourinary: Negative for urgency, frequency, decreased urine volume and difficulty urinating.  Musculoskeletal: Negative for back pain, arthralgias and gait problem.  Skin: Negative for color change, pallor and rash.  Neurological: Negative for dizziness, light-headedness, numbness and headaches.  Hematological: Negative for adenopathy. Does not bruise/bleed easily.  Psychiatric/Behavioral: Negative for suicidal ideas, confusion, sleep disturbance, self-injury, dysphoric mood, decreased concentration and agitation.       Objective:    BP 118/64  Pulse 60  Temp(Src) 98.5 F (36.9 C) (Oral)  Ht 5\' 2"  (1.575 m)  Wt 143 lb 6.4 oz (65.046 kg)  BMI 26.22 kg/m2  SpO2 96% General appearance: alert, cooperative, appears stated age and no distress Head: Normocephalic, without obvious abnormality, atraumatic Eyes: conjunctivae/corneas clear. PERRL, EOM's intact. Fundi benign. Ears: normal TM's and external ear canals both ears Nose: Nares normal. Septum midline. Mucosa normal. No drainage or sinus tenderness. Throat: lips, mucosa, and tongue normal; teeth and gums normal Neck: no adenopathy, no carotid bruit, no JVD, supple, symmetrical, trachea midline and thyroid not enlarged, symmetric, no tenderness/mass/nodules Back: symmetric, no curvature. ROM normal. No CVA tenderness. Lungs:  clear to auscultation bilaterally Breasts: gyn Heart: regular rate and rhythm, S1, S2 normal, no murmur, click, rub or gallop Abdomen: soft, non-tender; bowel sounds normal; no masses,  no organomegaly Pelvic: deferred-gyn Extremities:  extremities normal, atraumatic, no cyanosis or edema Pulses: 2+ and symmetric Skin: Skin color, texture, turgor normal. No rashes or lesions Lymph nodes: Cervical, supraclavicular, and axillary nodes normal. Neurologic: Alert and oriented X 3, normal strength and tone. Normal symmetric reflexes. Normal coordination and gait Psych- no depression, no anxiety      Assessment:    Healthy female exam.      Plan:    ghm utd Check labs See After Visit Summary for Counseling Recommendations

## 2013-05-17 LAB — URINE CULTURE: Colony Count: >=100000

## 2013-05-19 ENCOUNTER — Telehealth: Payer: Self-pay | Admitting: Family Medicine

## 2013-05-19 MED ORDER — CIPROFLOXACIN HCL 250 MG PO TABS: 250.0000 mg | ORAL_TABLET | Freq: Two times a day (BID) | ORAL | Status: DC

## 2013-05-19 NOTE — Telephone Encounter (Signed)
cipro 250 mg bid for 3 days sent and labs are release to [employer]     KP

## 2013-05-19 NOTE — Telephone Encounter (Signed)
See labs 

## 2013-05-19 NOTE — Telephone Encounter (Signed)
Patient is calling request lab results from 10/20. Please advise.

## 2013-05-19 NOTE — Telephone Encounter (Signed)
To MD for review     KP 

## 2013-05-22 ENCOUNTER — Telehealth: Payer: Self-pay | Admitting: Family Medicine

## 2013-05-22 NOTE — Telephone Encounter (Addendum)
Patient called about lab results she had done last week.

## 2013-05-23 NOTE — Telephone Encounter (Signed)
Review labs with patient and discuss UTI.     KP

## 2013-10-24 ENCOUNTER — Telehealth: Payer: Self-pay | Admitting: *Deleted

## 2013-10-24 DIAGNOSIS — R6889 Other general symptoms and signs: Secondary | ICD-10-CM

## 2013-10-24 MED ORDER — OSELTAMIVIR PHOSPHATE 75 MG PO CAPS: 75.0000 mg | ORAL_CAPSULE | Freq: Two times a day (BID) | ORAL | Status: DC

## 2013-10-24 NOTE — Telephone Encounter (Signed)
Ok to send in tamiflu 75 mg bid x 5 days

## 2013-10-24 NOTE — Telephone Encounter (Signed)
Spoke to patient who is complaining with flu-like sx (fever, sore throat, chills, body aches and nausea). She is requesting Tamiflu be called in to her pharmacy [facility] on [address]. She is willing to come in for appt if needed but would rather avoid leaving her home as much as possible due to feeling so bad. Can we send in tamiflu?

## 2013-10-24 NOTE — Telephone Encounter (Signed)
Rx for tamiflu sent to [facility], pt made aware

## 2014-05-21 ENCOUNTER — Ambulatory Visit (INDEPENDENT_AMBULATORY_CARE_PROVIDER_SITE_OTHER): Payer: BC Managed Care – PPO | Admitting: Family Medicine

## 2014-05-21 ENCOUNTER — Encounter: Payer: Self-pay | Admitting: Family Medicine

## 2014-05-21 VITALS — BP 116/80 | HR 70 | Temp 98.5°F | Ht 62.0 in | Wt 149.2 lb

## 2014-05-21 DIAGNOSIS — G43101 Migraine with aura, not intractable, with status migrainosus: Secondary | ICD-10-CM

## 2014-05-21 DIAGNOSIS — Z0000 Encounter for general adult medical examination without abnormal findings: Secondary | ICD-10-CM

## 2014-05-21 LAB — BASIC METABOLIC PANEL
BUN: 10 mg/dL (ref 6–23)
CALCIUM: 8.9 mg/dL (ref 8.4–10.5)
CO2: 25 mEq/L (ref 19–32)
Chloride: 105 mEq/L (ref 96–112)
Creatinine, Ser: 0.8 mg/dL (ref 0.4–1.2)
GFR: 76.87 mL/min (ref >60.00)
Glucose, Bld: 96 mg/dL (ref 70–99)
Potassium: 3.8 mEq/L (ref 3.5–5.1)
SODIUM: 138 meq/L (ref 135–145)

## 2014-05-21 LAB — CBC WITH DIFFERENTIAL/PLATELET
BASOS PCT: 1 % (ref 0.0–3.0)
Basophils Absolute: 0.1 10*3/uL (ref 0.0–0.1)
Eosinophils Absolute: 0.2 10*3/uL (ref 0.0–0.7)
Eosinophils Relative: 3.3 % (ref 0.0–5.0)
HEMATOCRIT: 43.4 % (ref 36.0–46.0)
Hemoglobin: 14.6 g/dL (ref 12.0–15.0)
Lymphocytes Relative: 37 % (ref 12.0–46.0)
Lymphs Abs: 2.3 10*3/uL (ref 0.7–4.0)
MCHC: 33.7 g/dL (ref 30.0–36.0)
MCV: 83.5 fl (ref 78.0–100.0)
MONO ABS: 0.4 10*3/uL (ref 0.1–1.0)
Monocytes Relative: 7.1 % (ref 3.0–12.0)
Neutro Abs: 3.3 10*3/uL (ref 1.4–7.7)
Neutrophils Relative %: 51.6 % (ref 43.0–77.0)
PLATELETS: 259 10*3/uL (ref 150.0–400.0)
RBC: 5.2 Mil/uL — ABNORMAL HIGH (ref 3.87–5.11)
RDW: 13.3 % (ref 11.5–15.5)
WBC: 6.3 10*3/uL (ref 4.0–10.5)

## 2014-05-21 LAB — POCT URINALYSIS DIPSTICK
Bilirubin, UA: NEGATIVE
Blood, UA: NEGATIVE
GLUCOSE UA: NEGATIVE
KETONES UA: NEGATIVE
LEUKOCYTES UA: NEGATIVE
Nitrite, UA: NEGATIVE
Protein, UA: 0.15
Spec Grav, UA: >=1.03
Urobilinogen, UA: 0.2
pH, UA: 5.5

## 2014-05-21 LAB — HEPATIC FUNCTION PANEL
ALBUMIN: 3.7 g/dL (ref 3.5–5.2)
ALT: 34 U/L (ref 0–35)
AST: 31 U/L (ref 0–37)
Alkaline Phosphatase: 79 U/L (ref 39–117)
BILIRUBIN TOTAL: 0.2 mg/dL (ref 0.2–1.2)
Bilirubin, Direct: 0 mg/dL (ref 0.0–0.3)
Total Protein: 7.2 g/dL (ref 6.0–8.3)

## 2014-05-21 LAB — LIPID PANEL
Cholesterol: 153 mg/dL (ref 0–200)
HDL: 34.8 mg/dL — ABNORMAL LOW (ref >39.00)
LDL Cholesterol: 105 mg/dL — ABNORMAL HIGH (ref 0–99)
NonHDL: 118.2
TRIGLYCERIDES: 66 mg/dL (ref 0.0–149.0)
Total CHOL/HDL Ratio: 4
VLDL: 13.2 mg/dL (ref 0.0–40.0)

## 2014-05-21 LAB — SEDIMENTATION RATE: SED RATE: 7 mm/h (ref 0–22)

## 2014-05-21 LAB — TSH: TSH: 0.97 u[IU]/mL (ref 0.35–4.50)

## 2014-05-21 NOTE — Progress Notes (Signed)
Pre visit review using our clinic review tool, if applicable. No additional management support is needed unless otherwise documented below in the visit note. 

## 2014-05-21 NOTE — Patient Instructions (Signed)
Preventive Care for Adults A healthy lifestyle and preventive care can promote health and wellness. Preventive health guidelines for women include the following key practices.  A routine yearly physical is a good way to check with your health care provider about your health and preventive screening. It is a chance to share any concerns and updates on your health and to receive a thorough exam.  Visit your dentist for a routine exam and preventive care every 6 months. Brush your teeth twice a day and floss once a day. Good oral hygiene prevents tooth decay and gum disease.  The frequency of eye exams is based on your age, health, family medical history, use of contact lenses, and other factors. Follow your health care provider's recommendations for frequency of eye exams.  Eat a healthy diet. Foods like vegetables, fruits, whole grains, low-fat dairy products, and lean protein foods contain the nutrients you need without too many calories. Decrease your intake of foods high in solid fats, added sugars, and salt. Eat the right amount of calories for you.Get information about a proper diet from your health care provider, if necessary.  Regular physical exercise is one of the most important things you can do for your health. Most adults should get at least 150 minutes of moderate-intensity exercise (any activity that increases your heart rate and causes you to sweat) each week. In addition, most adults need muscle-strengthening exercises on 2 or more days a week.  Maintain a healthy weight. The body mass index (BMI) is a screening tool to identify possible weight problems. It provides an estimate of body fat based on height and weight. Your health care provider can find your BMI and can help you achieve or maintain a healthy weight.For adults 20 years and older:  A BMI below 18.5 is considered underweight.  A BMI of 18.5 to 24.9 is normal.  A BMI of 25 to 29.9 is considered overweight.  A BMI of  30 and above is considered obese.  Maintain normal blood lipids and cholesterol levels by exercising and minimizing your intake of saturated fat. Eat a balanced diet with plenty of fruit and vegetables. Blood tests for lipids and cholesterol should begin at age 76 and be repeated every 5 years. If your lipid or cholesterol levels are high, you are over 50, or you are at high risk for heart disease, you may need your cholesterol levels checked more frequently.Ongoing high lipid and cholesterol levels should be treated with medicines if diet and exercise are not working.  If you smoke, find out from your health care provider how to quit. If you do not use tobacco, do not start.  Lung cancer screening is recommended for adults aged 22-80 years who are at high risk for developing lung cancer because of a history of smoking. A yearly low-dose CT scan of the lungs is recommended for people who have at least a 30-pack-year history of smoking and are a current smoker or have quit within the past 15 years. A pack year of smoking is smoking an average of 1 pack of cigarettes a day for 1 year (for example: 1 pack a day for 30 years or 2 packs a day for 15 years). Yearly screening should continue until the smoker has stopped smoking for at least 15 years. Yearly screening should be stopped for people who develop a health problem that would prevent them from having lung cancer treatment.  If you are pregnant, do not drink alcohol. If you are breastfeeding,  be very cautious about drinking alcohol. If you are not pregnant and choose to drink alcohol, do not have more than 1 drink per day. One drink is considered to be 12 ounces (355 mL) of beer, 5 ounces (148 mL) of wine, or 1.5 ounces (44 mL) of liquor.  Avoid use of street drugs. Do not share needles with anyone. Ask for help if you need support or instructions about stopping the use of drugs.  High blood pressure causes heart disease and increases the risk of  stroke. Your blood pressure should be checked at least every 1 to 2 years. Ongoing high blood pressure should be treated with medicines if weight loss and exercise do not work.  If you are 75-52 years old, ask your health care provider if you should take aspirin to prevent strokes.  Diabetes screening involves taking a blood sample to check your fasting blood sugar level. This should be done once every 3 years, after age 15, if you are within normal weight and without risk factors for diabetes. Testing should be considered at a younger age or be carried out more frequently if you are overweight and have at least 1 risk factor for diabetes.  Breast cancer screening is essential preventive care for women. You should practice "breast self-awareness." This means understanding the normal appearance and feel of your breasts and may include breast self-examination. Any changes detected, no matter how small, should be reported to a health care provider. Women in their 58s and 30s should have a clinical breast exam (CBE) by a health care provider as part of a regular health exam every 1 to 3 years. After age 16, women should have a CBE every year. Starting at age 53, women should consider having a mammogram (breast X-ray test) every year. Women who have a family history of breast cancer should talk to their health care provider about genetic screening. Women at a high risk of breast cancer should talk to their health care providers about having an MRI and a mammogram every year.  Breast cancer gene (BRCA)-related cancer risk assessment is recommended for women who have family members with BRCA-related cancers. BRCA-related cancers include breast, ovarian, tubal, and peritoneal cancers. Having family members with these cancers may be associated with an increased risk for harmful changes (mutations) in the breast cancer genes BRCA1 and BRCA2. Results of the assessment will determine the need for genetic counseling and  BRCA1 and BRCA2 testing.  Routine pelvic exams to screen for cancer are no longer recommended for nonpregnant women who are considered low risk for cancer of the pelvic organs (ovaries, uterus, and vagina) and who do not have symptoms. Ask your health care provider if a screening pelvic exam is right for you.  If you have had past treatment for cervical cancer or a condition that could lead to cancer, you need Pap tests and screening for cancer for at least 20 years after your treatment. If Pap tests have been discontinued, your risk factors (such as having a new sexual partner) need to be reassessed to determine if screening should be resumed. Some women have medical problems that increase the chance of getting cervical cancer. In these cases, your health care provider may recommend more frequent screening and Pap tests.  The HPV test is an additional test that may be used for cervical cancer screening. The HPV test looks for the virus that can cause the cell changes on the cervix. The cells collected during the Pap test can be  tested for HPV. The HPV test could be used to screen women aged 30 years and older, and should be used in women of any age who have unclear Pap test results. After the age of 30, women should have HPV testing at the same frequency as a Pap test.  Colorectal cancer can be detected and often prevented. Most routine colorectal cancer screening begins at the age of 50 years and continues through age 75 years. However, your health care provider may recommend screening at an earlier age if you have risk factors for colon cancer. On a yearly basis, your health care provider may provide home test kits to check for hidden blood in the stool. Use of a small camera at the end of a tube, to directly examine the colon (sigmoidoscopy or colonoscopy), can detect the earliest forms of colorectal cancer. Talk to your health care provider about this at age 50, when routine screening begins. Direct  exam of the colon should be repeated every 5-10 years through age 75 years, unless early forms of pre-cancerous polyps or small growths are found.  People who are at an increased risk for hepatitis B should be screened for this virus. You are considered at high risk for hepatitis B if:  You were born in a country where hepatitis B occurs often. Talk with your health care provider about which countries are considered high risk.  Your parents were born in a high-risk country and you have not received a shot to protect against hepatitis B (hepatitis B vaccine).  You have HIV or AIDS.  You use needles to inject street drugs.  You live with, or have sex with, someone who has hepatitis B.  You get hemodialysis treatment.  You take certain medicines for conditions like cancer, organ transplantation, and autoimmune conditions.  Hepatitis C blood testing is recommended for all people born from 1945 through 1965 and any individual with known risks for hepatitis C.  Practice safe sex. Use condoms and avoid high-risk sexual practices to reduce the spread of sexually transmitted infections (STIs). STIs include gonorrhea, chlamydia, syphilis, trichomonas, herpes, HPV, and human immunodeficiency virus (HIV). Herpes, HIV, and HPV are viral illnesses that have no cure. They can result in disability, cancer, and death.  You should be screened for sexually transmitted illnesses (STIs) including gonorrhea and chlamydia if:  You are sexually active and are younger than 24 years.  You are older than 24 years and your health care provider tells you that you are at risk for this type of infection.  Your sexual activity has changed since you were last screened and you are at an increased risk for chlamydia or gonorrhea. Ask your health care provider if you are at risk.  If you are at risk of being infected with HIV, it is recommended that you take a prescription medicine daily to prevent HIV infection. This is  called preexposure prophylaxis (PrEP). You are considered at risk if:  You are a heterosexual woman, are sexually active, and are at increased risk for HIV infection.  You take drugs by injection.  You are sexually active with a partner who has HIV.  Talk with your health care provider about whether you are at high risk of being infected with HIV. If you choose to begin PrEP, you should first be tested for HIV. You should then be tested every 3 months for as long as you are taking PrEP.  Osteoporosis is a disease in which the bones lose minerals and strength   with aging. This can result in serious bone fractures or breaks. The risk of osteoporosis can be identified using a bone density scan. Women ages 65 years and over and women at risk for fractures or osteoporosis should discuss screening with their health care providers. Ask your health care provider whether you should take a calcium supplement or vitamin D to reduce the rate of osteoporosis.  Menopause can be associated with physical symptoms and risks. Hormone replacement therapy is available to decrease symptoms and risks. You should talk to your health care provider about whether hormone replacement therapy is right for you.  Use sunscreen. Apply sunscreen liberally and repeatedly throughout the day. You should seek shade when your shadow is shorter than you. Protect yourself by wearing long sleeves, pants, a wide-brimmed hat, and sunglasses year round, whenever you are outdoors.  Once a month, do a whole body skin exam, using a mirror to look at the skin on your back. Tell your health care provider of new moles, moles that have irregular borders, moles that are larger than a pencil eraser, or moles that have changed in shape or color.  Stay current with required vaccines (immunizations).  Influenza vaccine. All adults should be immunized every year.  Tetanus, diphtheria, and acellular pertussis (Td, Tdap) vaccine. Pregnant women should  receive 1 dose of Tdap vaccine during each pregnancy. The dose should be obtained regardless of the length of time since the last dose. Immunization is preferred during the 27th-36th week of gestation. An adult who has not previously received Tdap or who does not know her vaccine status should receive 1 dose of Tdap. This initial dose should be followed by tetanus and diphtheria toxoids (Td) booster doses every 10 years. Adults with an unknown or incomplete history of completing a 3-dose immunization series with Td-containing vaccines should begin or complete a primary immunization series including a Tdap dose. Adults should receive a Td booster every 10 years.  Varicella vaccine. An adult without evidence of immunity to varicella should receive 2 doses or a second dose if she has previously received 1 dose. Pregnant females who do not have evidence of immunity should receive the first dose after pregnancy. This first dose should be obtained before leaving the health care facility. The second dose should be obtained 4-8 weeks after the first dose.  Human papillomavirus (HPV) vaccine. Females aged 13-26 years who have not received the vaccine previously should obtain the 3-dose series. The vaccine is not recommended for use in pregnant females. However, pregnancy testing is not needed before receiving a dose. If a female is found to be pregnant after receiving a dose, no treatment is needed. In that case, the remaining doses should be delayed until after the pregnancy. Immunization is recommended for any person with an immunocompromised condition through the age of 26 years if she did not get any or all doses earlier. During the 3-dose series, the second dose should be obtained 4-8 weeks after the first dose. The third dose should be obtained 24 weeks after the first dose and 16 weeks after the second dose.  Zoster vaccine. One dose is recommended for adults aged 60 years or older unless certain conditions are  present.  Measles, mumps, and rubella (MMR) vaccine. Adults born before 1957 generally are considered immune to measles and mumps. Adults born in 1957 or later should have 1 or more doses of MMR vaccine unless there is a contraindication to the vaccine or there is laboratory evidence of immunity to   each of the three diseases. A routine second dose of MMR vaccine should be obtained at least 28 days after the first dose for students attending postsecondary schools, health care workers, or international travelers. People who received inactivated measles vaccine or an unknown type of measles vaccine during 1963-1967 should receive 2 doses of MMR vaccine. People who received inactivated mumps vaccine or an unknown type of mumps vaccine before 1979 and are at high risk for mumps infection should consider immunization with 2 doses of MMR vaccine. For females of childbearing age, rubella immunity should be determined. If there is no evidence of immunity, females who are not pregnant should be vaccinated. If there is no evidence of immunity, females who are pregnant should delay immunization until after pregnancy. Unvaccinated health care workers born before 1957 who lack laboratory evidence of measles, mumps, or rubella immunity or laboratory confirmation of disease should consider measles and mumps immunization with 2 doses of MMR vaccine or rubella immunization with 1 dose of MMR vaccine.  Pneumococcal 13-valent conjugate (PCV13) vaccine. When indicated, a person who is uncertain of her immunization history and has no record of immunization should receive the PCV13 vaccine. An adult aged 19 years or older who has certain medical conditions and has not been previously immunized should receive 1 dose of PCV13 vaccine. This PCV13 should be followed with a dose of pneumococcal polysaccharide (PPSV23) vaccine. The PPSV23 vaccine dose should be obtained at least 8 weeks after the dose of PCV13 vaccine. An adult aged 19  years or older who has certain medical conditions and previously received 1 or more doses of PPSV23 vaccine should receive 1 dose of PCV13. The PCV13 vaccine dose should be obtained 1 or more years after the last PPSV23 vaccine dose.  Pneumococcal polysaccharide (PPSV23) vaccine. When PCV13 is also indicated, PCV13 should be obtained first. All adults aged 65 years and older should be immunized. An adult younger than age 65 years who has certain medical conditions should be immunized. Any person who resides in a nursing home or long-term care facility should be immunized. An adult smoker should be immunized. People with an immunocompromised condition and certain other conditions should receive both PCV13 and PPSV23 vaccines. People with human immunodeficiency virus (HIV) infection should be immunized as soon as possible after diagnosis. Immunization during chemotherapy or radiation therapy should be avoided. Routine use of PPSV23 vaccine is not recommended for American Indians, [place] Natives, or people younger than 65 years unless there are medical conditions that require PPSV23 vaccine. When indicated, people who have unknown immunization and have no record of immunization should receive PPSV23 vaccine. One-time revaccination 5 years after the first dose of PPSV23 is recommended for people aged 19-64 years who have chronic kidney failure, nephrotic syndrome, asplenia, or immunocompromised conditions. People who received 1-2 doses of PPSV23 before age 65 years should receive another dose of PPSV23 vaccine at age 65 years or later if at least 5 years have passed since the previous dose. Doses of PPSV23 are not needed for people immunized with PPSV23 at or after age 65 years.  Meningococcal vaccine. Adults with asplenia or persistent complement component deficiencies should receive 2 doses of quadrivalent meningococcal conjugate (MenACWY-D) vaccine. The doses should be obtained at least 2 months apart.  Microbiologists working with certain meningococcal bacteria, military recruits, people at risk during an outbreak, and people who travel to or live in countries with a high rate of meningitis should be immunized. A first-year college student up through age   21 years who is living in a residence hall should receive a dose if she did not receive a dose on or after her 16th birthday. Adults who have certain high-risk conditions should receive one or more doses of vaccine.  Hepatitis A vaccine. Adults who wish to be protected from this disease, have certain high-risk conditions, work with hepatitis A-infected animals, work in hepatitis A research labs, or travel to or work in countries with a high rate of hepatitis A should be immunized. Adults who were previously unvaccinated and who anticipate close contact with an international adoptee during the first 60 days after arrival in the [place] from a country with a high rate of hepatitis A should be immunized.  Hepatitis B vaccine. Adults who wish to be protected from this disease, have certain high-risk conditions, may be exposed to blood or other infectious body fluids, are household contacts or sex partners of hepatitis B positive people, are clients or workers in certain care facilities, or travel to or work in countries with a high rate of hepatitis B should be immunized.  Haemophilus influenzae type b (Hib) vaccine. A previously unvaccinated person with asplenia or sickle cell disease or having a scheduled splenectomy should receive 1 dose of Hib vaccine. Regardless of previous immunization, a recipient of a hematopoietic stem cell transplant should receive a 3-dose series 6-12 months after her successful transplant. Hib vaccine is not recommended for adults with HIV infection. Preventive Services / Frequency Ages 64 to 68 years  Blood pressure check.** / Every 1 to 2 years.  Lipid and cholesterol check.** / Every 5 years beginning at age  22.  Clinical breast exam.** / Every 3 years for women in their 88s and 53s.  BRCA-related cancer risk assessment.** / For women who have family members with a BRCA-related cancer (breast, ovarian, tubal, or peritoneal cancers).  Pap test.** / Every 2 years from ages 90 through 51. Every 3 years starting at age 21 through age 56 or 3 with a history of 3 consecutive normal Pap tests.  HPV screening.** / Every 3 years from ages 24 through ages 1 to 46 with a history of 3 consecutive normal Pap tests.  Hepatitis C blood test.** / For any individual with known risks for hepatitis C.  Skin self-exam. / Monthly.  Influenza vaccine. / Every year.  Tetanus, diphtheria, and acellular pertussis (Tdap, Td) vaccine.** / Consult your health care provider. Pregnant women should receive 1 dose of Tdap vaccine during each pregnancy. 1 dose of Td every 10 years.  Varicella vaccine.** / Consult your health care provider. Pregnant females who do not have evidence of immunity should receive the first dose after pregnancy.  HPV vaccine. / 3 doses over 6 months, if 72 and younger. The vaccine is not recommended for use in pregnant females. However, pregnancy testing is not needed before receiving a dose.  Measles, mumps, rubella (MMR) vaccine.** / You need at least 1 dose of MMR if you were born in 1957 or later. You may also need a 2nd dose. For females of childbearing age, rubella immunity should be determined. If there is no evidence of immunity, females who are not pregnant should be vaccinated. If there is no evidence of immunity, females who are pregnant should delay immunization until after pregnancy.  Pneumococcal 13-valent conjugate (PCV13) vaccine.** / Consult your health care provider.  Pneumococcal polysaccharide (PPSV23) vaccine.** / 1 to 2 doses if you smoke cigarettes or if you have certain conditions.  Meningococcal vaccine.** /  1 dose if you are age 19 to 21 years and a first-year college  student living in a residence hall, or have one of several medical conditions, you need to get vaccinated against meningococcal disease. You may also need additional booster doses.  Hepatitis A vaccine.** / Consult your health care provider.  Hepatitis B vaccine.** / Consult your health care provider.  Haemophilus influenzae type b (Hib) vaccine.** / Consult your health care provider. Ages 40 to 64 years  Blood pressure check.** / Every 1 to 2 years.  Lipid and cholesterol check.** / Every 5 years beginning at age 20 years.  Lung cancer screening. / Every year if you are aged 55-80 years and have a 30-pack-year history of smoking and currently smoke or have quit within the past 15 years. Yearly screening is stopped once you have quit smoking for at least 15 years or develop a health problem that would prevent you from having lung cancer treatment.  Clinical breast exam.** / Every year after age 40 years.  BRCA-related cancer risk assessment.** / For women who have family members with a BRCA-related cancer (breast, ovarian, tubal, or peritoneal cancers).  Mammogram.** / Every year beginning at age 40 years and continuing for as long as you are in good health. Consult with your health care provider.  Pap test.** / Every 3 years starting at age 30 years through age 65 or 70 years with a history of 3 consecutive normal Pap tests.  HPV screening.** / Every 3 years from ages 30 years through ages 65 to 70 years with a history of 3 consecutive normal Pap tests.  Fecal occult blood test (FOBT) of stool. / Every year beginning at age 50 years and continuing until age 75 years. You may not need to do this test if you get a colonoscopy every 10 years.  Flexible sigmoidoscopy or colonoscopy.** / Every 5 years for a flexible sigmoidoscopy or every 10 years for a colonoscopy beginning at age 50 years and continuing until age 75 years.  Hepatitis C blood test.** / For all people born from 1945 through  1965 and any individual with known risks for hepatitis C.  Skin self-exam. / Monthly.  Influenza vaccine. / Every year.  Tetanus, diphtheria, and acellular pertussis (Tdap/Td) vaccine.** / Consult your health care provider. Pregnant women should receive 1 dose of Tdap vaccine during each pregnancy. 1 dose of Td every 10 years.  Varicella vaccine.** / Consult your health care provider. Pregnant females who do not have evidence of immunity should receive the first dose after pregnancy.  Zoster vaccine.** / 1 dose for adults aged 60 years or older.  Measles, mumps, rubella (MMR) vaccine.** / You need at least 1 dose of MMR if you were born in 1957 or later. You may also need a 2nd dose. For females of childbearing age, rubella immunity should be determined. If there is no evidence of immunity, females who are not pregnant should be vaccinated. If there is no evidence of immunity, females who are pregnant should delay immunization until after pregnancy.  Pneumococcal 13-valent conjugate (PCV13) vaccine.** / Consult your health care provider.  Pneumococcal polysaccharide (PPSV23) vaccine.** / 1 to 2 doses if you smoke cigarettes or if you have certain conditions.  Meningococcal vaccine.** / Consult your health care provider.  Hepatitis A vaccine.** / Consult your health care provider.  Hepatitis B vaccine.** / Consult your health care provider.  Haemophilus influenzae type b (Hib) vaccine.** / Consult your health care provider. Ages 65   years and over  Blood pressure check.** / Every 1 to 2 years.  Lipid and cholesterol check.** / Every 5 years beginning at age 22 years.  Lung cancer screening. / Every year if you are aged 73-80 years and have a 30-pack-year history of smoking and currently smoke or have quit within the past 15 years. Yearly screening is stopped once you have quit smoking for at least 15 years or develop a health problem that would prevent you from having lung cancer  treatment.  Clinical breast exam.** / Every year after age 4 years.  BRCA-related cancer risk assessment.** / For women who have family members with a BRCA-related cancer (breast, ovarian, tubal, or peritoneal cancers).  Mammogram.** / Every year beginning at age 40 years and continuing for as long as you are in good health. Consult with your health care provider.  Pap test.** / Every 3 years starting at age 9 years through age 34 or 91 years with 3 consecutive normal Pap tests. Testing can be stopped between 65 and 70 years with 3 consecutive normal Pap tests and no abnormal Pap or HPV tests in the past 10 years.  HPV screening.** / Every 3 years from ages 57 years through ages 64 or 45 years with a history of 3 consecutive normal Pap tests. Testing can be stopped between 65 and 70 years with 3 consecutive normal Pap tests and no abnormal Pap or HPV tests in the past 10 years.  Fecal occult blood test (FOBT) of stool. / Every year beginning at age 15 years and continuing until age 17 years. You may not need to do this test if you get a colonoscopy every 10 years.  Flexible sigmoidoscopy or colonoscopy.** / Every 5 years for a flexible sigmoidoscopy or every 10 years for a colonoscopy beginning at age 86 years and continuing until age 71 years.  Hepatitis C blood test.** / For all people born from [birth year] through 1965 and any individual with known risks for hepatitis C.  Osteoporosis screening.** / A one-time screening for women ages 83 years and over and women at risk for fractures or osteoporosis.  Skin self-exam. / Monthly.  Influenza vaccine. / Every year.  Tetanus, diphtheria, and acellular pertussis (Tdap/Td) vaccine.** / 1 dose of Td every 10 years.  Varicella vaccine.** / Consult your health care provider.  Zoster vaccine.** / 1 dose for adults aged 61 years or older.  Pneumococcal 13-valent conjugate (PCV13) vaccine.** / Consult your health care provider.  Pneumococcal  polysaccharide (PPSV23) vaccine.** / 1 dose for all adults aged 28 years and older.  Meningococcal vaccine.** / Consult your health care provider.  Hepatitis A vaccine.** / Consult your health care provider.  Hepatitis B vaccine.** / Consult your health care provider.  Haemophilus influenzae type b (Hib) vaccine.** / Consult your health care provider. ** Family history and personal history of risk and conditions may change your health care provider's recommendations. Document Released: 09/08/2001 Document Revised: 11/27/2013 Document Reviewed: 12/08/2010 [facility] Patient Information 2015 [place], [place]. This information is not intended to replace advice given to you by your health care provider. Make sure you discuss any questions you have with your health care provider.

## 2014-05-21 NOTE — Progress Notes (Signed)
Subjective:     Teresa Henry is a 48 y.o. female and is here for a comprehensive physical exam. The patient reports problem with inc headaches.  Not every day but several x a week.  Different than her normal migraines.  She has an appointment to see oph this week.  History   Social History  . Marital Status: Married    Spouse Name: N/A    Number of Children: N/A  . Years of Education: N/A   Occupational History  . Agricultural engineer-- self employed    Social History Main Topics  . Smoking status: Never Smoker   . Smokeless tobacco: Never Used  . Alcohol Use: No  . Drug Use: No  . Sexual Activity: Yes    Partners: Male   Other Topics Concern  . Not on file   Social History Narrative  . No narrative on file   Health Maintenance  Topic Date Due  . Mammogram  05/01/2014  . Pap Smear  05/01/2014  . Influenza Vaccine  05/22/2015 (Originally 02/24/2014)  . Tetanus/tdap  04/19/2022    The following portions of the patient's history were reviewed and updated as appropriate:  She  has a past medical history of Frequent episodic tension-type headache. She  does not have any pertinent problems on file. She  has past surgical history that includes Breast biopsy (1999). Her family history includes Arthritis in her father and mother; Heart attack (age of onset: 67) in her brother; Hypertension in her father and mother; Stroke in her maternal grandmother and paternal grandmother. She  reports that she has never smoked. She has never used smokeless tobacco. She reports that she does not drink alcohol or use illicit drugs. She has a current medication list which includes the following prescription(s): aspirin. Current Outpatient Prescriptions on File Prior to Visit  Medication Sig Dispense Refill  . aspirin 81 MG tablet Take 81 mg by mouth daily.       No current facility-administered medications on file prior to visit.   She has No Known Allergies..  Review of Systems Review  of Systems  Constitutional: Negative for activity change, appetite change and fatigue.  HENT: Negative for hearing loss, congestion, tinnitus and ear discharge.  dentist q89m Eyes: Negative for visual disturbance (see optho q1y -- vision corrected to 20/20 with glasses).  Respiratory: Negative for cough, chest tightness and shortness of breath.   Cardiovascular: Negative for chest pain, palpitations and leg swelling.  Gastrointestinal: Negative for abdominal pain, diarrhea, constipation and abdominal distention.  Genitourinary: Negative for urgency, frequency, decreased urine volume and difficulty urinating.  Musculoskeletal: Negative for back pain, arthralgias and gait problem.  Skin: Negative for color change, pallor and rash.  Neurological: Negative for dizziness, light-headedness, numbness and headaches.  Hematological: Negative for adenopathy. Does not bruise/bleed easily.  Psychiatric/Behavioral: Negative for suicidal ideas, confusion, sleep disturbance, self-injury, dysphoric mood, decreased concentration and agitation.       Objective:    BP 116/80  Pulse 70  Temp(Src) 98.5 F (36.9 C) (Oral)  Ht 5\' 2"  (1.575 m)  Wt 149 lb 3.2 oz (67.677 kg)  BMI 27.28 kg/m2  SpO2 98%  LMP 05/14/2014 General appearance: alert, cooperative, appears stated age and no distress Head: Normocephalic, without obvious abnormality, atraumatic Eyes: negative findings: lids and lashes normal and pupils equal, round, reactive to light and accomodation Ears: normal TM's and external ear canals both ears Nose: Nares normal. Septum midline. Mucosa normal. No drainage or sinus tenderness. Throat: lips,  mucosa, and tongue normal; teeth and gums normal Neck: no adenopathy, no carotid bruit, no JVD, supple, symmetrical, trachea midline and thyroid not enlarged, symmetric, no tenderness/mass/nodules Back: symmetric, no curvature. ROM normal. No CVA tenderness. Lungs: clear to auscultation  bilaterally Breasts: gyn Heart: S1, S2 normal Abdomen: soft, non-tender; bowel sounds normal; no masses,  no organomegaly Pelvic: deferred ---gyn Extremities: extremities normal, atraumatic, no cyanosis or edema Pulses: 2+ and symmetric Skin: Skin color, texture, turgor normal. No rashes or lesions Lymph nodes: Cervical, supraclavicular, and axillary nodes normal. Neurologic: Alert and oriented X 3, normal strength and tone. Normal symmetric reflexes. Normal coordination and gait Psych-- normal      Assessment:    Healthy female exam.      Plan:  ghm utd Check labs   See After Visit Summary for Counseling Recommendations   1. Preventative health care   - Basic metabolic panel - CBC with Differential - Hepatic function panel - Lipid panel - POCT urinalysis dipstick - TSH  2. Migraine with aura and with status migrainosus, not intractable   - Sedimentation rate

## 2014-07-27 DIAGNOSIS — I469 Cardiac arrest, cause unspecified: Secondary | ICD-10-CM

## 2014-07-27 HISTORY — DX: Cardiac arrest, cause unspecified: I46.9

## 2014-08-02 ENCOUNTER — Telehealth: Payer: Self-pay | Admitting: Family Medicine

## 2014-08-02 NOTE — Telephone Encounter (Signed)
FYI

## 2014-08-02 NOTE — Telephone Encounter (Signed)
Patient Name: Teresa Henry  DOB: 1965/12/16    Nurse Assessment  Nurse: Mallie Mussel, RN, Alveta Heimlich Date/Time Eilene Ghazi Time): 08/02/2014 12:33:43 PM  Confirm and document reason for call. If symptomatic, describe symptoms. ---Caller states that 2 evenings ago, she began with a fever and feeling achy. Denies sore throat, "much" congestion but she does have a dry cough. Temp is 100.5 orally before taking Tylenol.  Has the patient traveled out of the country within the last 30 days? ---No  Does the patient require triage? ---Yes  Related visit to physician within the last 2 weeks? ---No  Does the PT have any chronic conditions? (i.e. diabetes, asthma, etc.) ---No  Did the patient indicate they were pregnant? ---No     Guidelines    Guideline Title Affirmed Question Affirmed Notes  Influenza - Seasonal [1] Probable influenza (fever) with no complications AND [3] NOT HIGH RISK (all triage questions negative)    Final Disposition User   [place], RN, Alveta Heimlich

## 2014-08-03 ENCOUNTER — Encounter: Payer: BC Managed Care – PPO | Admitting: Family Medicine

## 2014-08-14 ENCOUNTER — Other Ambulatory Visit: Payer: Self-pay | Admitting: Obstetrics and Gynecology

## 2014-08-14 DIAGNOSIS — R609 Edema, unspecified: Secondary | ICD-10-CM

## 2014-08-15 ENCOUNTER — Ambulatory Visit
Admission: RE | Admit: 2014-08-15 | Discharge: 2014-08-15 | Disposition: A | Discharge status: Home / Self Care | Payer: BLUE CROSS/BLUE SHIELD | Source: Ambulatory Visit | Attending: Obstetrics and Gynecology | Admitting: Obstetrics and Gynecology

## 2014-08-15 DIAGNOSIS — R609 Edema, unspecified: Secondary | ICD-10-CM

## 2014-08-16 ENCOUNTER — Other Ambulatory Visit: Payer: Self-pay

## 2014-08-24 ENCOUNTER — Encounter: Payer: Self-pay | Admitting: Internal Medicine

## 2015-02-26 ENCOUNTER — Emergency Department (HOSPITAL_COMMUNITY)
Admission: EM | Admit: 2015-02-26 | Discharge: 2015-02-26 | Disposition: A | Discharge status: Home / Self Care | Payer: BLUE CROSS/BLUE SHIELD | Source: Home / Self Care | Attending: Emergency Medicine | Admitting: Emergency Medicine

## 2015-02-26 ENCOUNTER — Encounter (HOSPITAL_COMMUNITY): Payer: Self-pay | Admitting: Emergency Medicine

## 2015-02-26 DIAGNOSIS — R55 Syncope and collapse: Secondary | ICD-10-CM

## 2015-02-26 DIAGNOSIS — Z8669 Personal history of other diseases of the nervous system and sense organs: Secondary | ICD-10-CM

## 2015-02-26 DIAGNOSIS — R61 Generalized hyperhidrosis: Secondary | ICD-10-CM | POA: Insufficient documentation

## 2015-02-26 DIAGNOSIS — R51 Headache: Secondary | ICD-10-CM

## 2015-02-26 DIAGNOSIS — Z3202 Encounter for pregnancy test, result negative: Secondary | ICD-10-CM

## 2015-02-26 DIAGNOSIS — I472 Ventricular tachycardia: Secondary | ICD-10-CM | POA: Diagnosis not present

## 2015-02-26 DIAGNOSIS — R110 Nausea: Secondary | ICD-10-CM

## 2015-02-26 DIAGNOSIS — Z9889 Other specified postprocedural states: Secondary | ICD-10-CM | POA: Insufficient documentation

## 2015-02-26 DIAGNOSIS — Z7982 Long term (current) use of aspirin: Secondary | ICD-10-CM

## 2015-02-26 LAB — CBC
HCT: 43.2 % (ref 36.0–46.0)
HEMOGLOBIN: 14.7 g/dL (ref 12.0–15.0)
MCH: 28.4 pg (ref 26.0–34.0)
MCHC: 34 g/dL (ref 30.0–36.0)
MCV: 83.4 fL (ref 78.0–100.0)
Platelets: 235 10*3/uL (ref 150–400)
RBC: 5.18 MIL/uL — ABNORMAL HIGH (ref 3.87–5.11)
RDW: 12.9 % (ref 11.5–15.5)
WBC: 9.9 10*3/uL (ref 4.0–10.5)

## 2015-02-26 LAB — URINE MICROSCOPIC-ADD ON

## 2015-02-26 LAB — URINALYSIS, ROUTINE W REFLEX MICROSCOPIC
Bilirubin Urine: NEGATIVE
Glucose, UA: NEGATIVE mg/dL
Hgb urine dipstick: NEGATIVE
KETONES UR: NEGATIVE mg/dL
LEUKOCYTES UA: NEGATIVE
Nitrite: NEGATIVE
Protein, ur: 30 mg/dL — AB
Specific Gravity, Urine: 1.011 (ref 1.005–1.030)
Urobilinogen, UA: 0.2 mg/dL (ref 0.0–1.0)
pH: 7.5 (ref 5.0–8.0)

## 2015-02-26 LAB — BASIC METABOLIC PANEL
Anion gap: 12 (ref 5–15)
BUN: 9 mg/dL (ref 6–20)
CHLORIDE: 101 mmol/L (ref 101–111)
CO2: 25 mmol/L (ref 22–32)
Calcium: 8.9 mg/dL (ref 8.9–10.3)
Creatinine, Ser: 0.94 mg/dL (ref 0.44–1.00)
Glucose, Bld: 107 mg/dL — ABNORMAL HIGH (ref 65–99)
Potassium: 3.4 mmol/L — ABNORMAL LOW (ref 3.5–5.1)
Sodium: 138 mmol/L (ref 135–145)

## 2015-02-26 LAB — I-STAT BETA HCG BLOOD, ED (MC, WL, AP ONLY)

## 2015-02-26 LAB — CBG MONITORING, ED: Glucose-Capillary: 112 mg/dL — ABNORMAL HIGH (ref 65–99)

## 2015-02-26 MED ORDER — ONDANSETRON HCL 4 MG/2ML IJ SOLN
4.0000 mg | Freq: Once | INTRAMUSCULAR | Status: AC
  Administered 2015-02-26: 4 mg via INTRAVENOUS
  Filled 2015-02-26: qty 2

## 2015-02-26 MED ORDER — SODIUM CHLORIDE 0.9 % IV BOLUS (SEPSIS)
1000.0000 mL | Freq: Once | INTRAVENOUS | Status: AC
  Administered 2015-02-26: 1000 mL via INTRAVENOUS

## 2015-02-26 NOTE — ED Provider Notes (Signed)
CSN: [id number]     Arrival date & time 02/26/15  1601 History   First MD Initiated Contact with Patient 02/26/15 1608     Chief Complaint  Patient presents with  . Loss of Consciousness     (Consider location/radiation/quality/duration/timing/severity/associated sxs/prior Treatment) Patient is a 49 y.o. female presenting with syncope.  Loss of Consciousness Episode history:  Single Most recent episode:  Today Duration: unknown. Timing:  Constant Progression:  Resolved Chronicity:  New Context comment:  Got out of car and went into a house that is in the process of being built (hot inside).  She was standing and talking on the phone when she passed out.   Witnessed: no   Relieved by:  Nothing Worsened by:  Nothing tried Associated symptoms: diaphoresis, headaches (after she woke up) and nausea (after she woke up)   Associated symptoms: no chest pain, no difficulty breathing, no shortness of breath and no vomiting     Past Medical History  Diagnosis Date  . Frequent episodic tension-type headache    Past Surgical History  Procedure Laterality Date  . Breast biopsy  1999    Needle Biopsy- Non cancer   Family History  Problem Relation Age of Onset  . Arthritis Mother   . Hypertension Mother   . Arthritis Father   . Hypertension Father   . Stroke Maternal Grandmother   . Stroke Paternal Grandmother   . Heart attack Brother 5   History  Substance Use Topics  . Smoking status: Never Smoker   . Smokeless tobacco: Never Used  . Alcohol Use: No   OB History    No data available     Review of Systems  Constitutional: Positive for diaphoresis.  Respiratory: Negative for shortness of breath.   Cardiovascular: Positive for syncope. Negative for chest pain.  Gastrointestinal: Positive for nausea (after she woke up). Negative for vomiting.  Neurological: Positive for headaches (after she woke up).  All other systems reviewed and are negative.     Allergies  Review  of patient's allergies indicates no known allergies.  Home Medications   Prior to Admission medications   Medication Sig Start Date End Date Taking? Authorizing Provider  aspirin 81 MG tablet Take 81 mg by mouth daily.    Historical Provider, MD   BP 123/76 mmHg  Pulse 80  Temp(Src) 97.6 F (36.4 C) (Oral)  Resp 16  Ht 5\' 3"  (1.6 m)  Wt 155 lb (70.308 kg)  BMI 27.46 kg/m2  SpO2 96%  LMP 02/25/2015 (Exact Date) Physical Exam  Constitutional: She is oriented to person, place, and time. She appears well-developed and well-nourished. No distress.  HENT:  Head: Normocephalic and atraumatic.  Mouth/Throat: Oropharynx is clear and moist.  Eyes: Conjunctivae are normal. Pupils are equal, round, and reactive to light. No scleral icterus.  Neck: Normal range of motion. Neck supple. No spinous process tenderness and no muscular tenderness present.  Cardiovascular: Normal rate, regular rhythm, normal heart sounds and intact distal pulses.   No murmur heard. Pulmonary/Chest: Effort normal and breath sounds normal. No stridor. No respiratory distress. She has no rales.  Abdominal: Soft. Bowel sounds are normal. She exhibits no distension. There is no tenderness.  Musculoskeletal: Normal range of motion.  Neurological: She is alert and oriented to person, place, and time.  Skin: Skin is warm and dry. No rash noted.  Psychiatric: She has a normal mood and affect. Her behavior is normal.  Nursing note and vitals reviewed.   ED  Course  Procedures (including critical care time) Labs Review Labs Reviewed  BASIC METABOLIC PANEL - Abnormal; Notable for the following:    Potassium 3.4 (*)    Glucose, Bld 107 (*)    All other components within normal limits  CBC - Abnormal; Notable for the following:    RBC 5.18 (*)    All other components within normal limits  URINALYSIS, ROUTINE W REFLEX MICROSCOPIC (NOT AT [facility]) - Abnormal; Notable for the following:    APPearance CLOUDY (*)    Protein,  ur 30 (*)    All other components within normal limits  URINE MICROSCOPIC-ADD ON - Abnormal; Notable for the following:    Casts GRANULAR CAST (*)    All other components within normal limits  CBG MONITORING, ED - Abnormal; Notable for the following:    Glucose-Capillary 112 (*)    All other components within normal limits  I-STAT BETA HCG BLOOD, ED ([facility], [facility], [facility] ONLY)    Imaging Review No results found.   EKG Interpretation   Date/Time:  Tuesday February 26 2015 16:02:14 EDT Ventricular Rate:  78 PR Interval:  179 QRS Duration: 94 QT Interval:  410 QTC Calculation: 467 R Axis:   45 Text Interpretation:  Sinus rhythm Low voltage, precordial leads No old  tracing to compare Confirmed by [facility]  MD, TREY ([id number]) on 02/26/2015  4:47:49 PM      MDM   Final diagnoses:  Syncope, unspecified syncope type    49 yo female presenting with syncope.  Likely heat related.  EKG shows NSR with no delta waves, brugada, or prolonged QT.  She has a mild headache right now, but is alert and oriented. Also, no evidence of head or neck trauma. I don't think she needs head imaging. Plan blood work, urinalysis, IV fluids.  Labs and UA unremarkable.  Feels better after fluids.  Plan dc with PCP follow up.  Serita Grit, MD 02/26/15 [place]

## 2015-02-26 NOTE — ED Notes (Addendum)
Per EMS: currently having house built, was at construction site inside [employer] (no utilities yet), talking on telephone when pt had a witnessed syncopal episode.  Loc for about a minute, was slightly confused upon EMS arrival.  Initially patient was very pale and diaphoretic and confused upon arrival.  Now patient is still pale but is alert and oriented, skin warm and dry.   Was orthostatic with ems.  Vital signs are stable.  12 lead with EMS was unremarkable.  Pt was able to pass spinal clearance test with EMS, no collar in place.   Also it was reported to EMS that she recently started her menstral cycle within the last few days and the flow has been heavier than normal.

## 2015-02-26 NOTE — ED Notes (Signed)
Patient was given 8 mg of zofran and 150 cc's of nacl en route with EMS.

## 2015-02-26 NOTE — Discharge Instructions (Signed)
Syncope °Syncope is a medical term for fainting or passing out. This means you lose consciousness and drop to the ground. People are generally unconscious for less than 5 minutes. You may have some muscle twitches for up to 15 seconds before waking up and returning to normal. Syncope occurs more often in older adults, but it can happen to anyone. While most causes of syncope are not dangerous, syncope can be a sign of a serious medical problem. It is important to seek medical care.  °CAUSES  °Syncope is caused by a sudden drop in blood flow to the brain. The specific cause is often not determined. Factors that can bring on syncope include: °· Taking medicines that lower blood pressure. °· Sudden changes in posture, such as standing up quickly. °· Taking more medicine than prescribed. °· Standing in one place for too long. °· Seizure disorders. °· Dehydration and excessive exposure to heat. °· Low blood sugar (hypoglycemia). °· Straining to have a bowel movement. °· Heart disease, irregular heartbeat, or other circulatory problems. °· Fear, emotional distress, seeing blood, or severe pain. °SYMPTOMS  °Right before fainting, you may: °· Feel dizzy or light-headed. °· Feel nauseous. °· See all white or all black in your field of vision. °· Have cold, clammy skin. °DIAGNOSIS  °Your health care provider will ask about your symptoms, perform a physical exam, and perform an electrocardiogram (ECG) to record the electrical activity of your heart. Your health care provider may also perform other heart or blood tests to determine the cause of your syncope which may include: °· Transthoracic echocardiogram (TTE). During echocardiography, sound waves are used to evaluate how blood flows through your heart. °· Transesophageal echocardiogram (TEE). °· Cardiac monitoring. This allows your health care provider to monitor your heart rate and rhythm in real time. °· Holter monitor. This is a portable device that records your  heartbeat and can help diagnose heart arrhythmias. It allows your health care provider to track your heart activity for several days, if needed. °· Stress tests by exercise or by giving medicine that makes the heart beat faster. °TREATMENT  °In most cases, no treatment is needed. Depending on the cause of your syncope, your health care provider may recommend changing or stopping some of your medicines. °HOME CARE INSTRUCTIONS °· Have someone stay with you until you feel stable. °· Do not drive, use machinery, or play sports until your health care provider says it is okay. °· Keep all follow-up appointments as directed by your health care provider. °· Lie down right away if you start feeling like you might faint. Breathe deeply and steadily. Wait until all the symptoms have passed. °· Drink enough fluids to keep your urine clear or pale yellow. °· If you are taking blood pressure or heart medicine, get up slowly and take several minutes to sit and then stand. This can reduce dizziness. °SEEK IMMEDIATE MEDICAL CARE IF:  °· You have a severe headache. °· You have unusual pain in the chest, abdomen, or back. °· You are bleeding from your mouth or rectum, or you have black or tarry stool. °· You have an irregular or very fast heartbeat. °· You have pain with breathing. °· You have repeated fainting or seizure-like jerking during an episode. °· You faint when sitting or lying down. °· You have confusion. °· You have trouble walking. °· You have severe weakness. °· You have vision problems. °If you fainted, call your local emergency services (911 in [place] Do not drive   yourself to the hospital.  °MAKE SURE YOU: °· Understand these instructions. °· Will watch your condition. °· Will get help right away if you are not doing well or get worse. °Document Released: 07/13/2005 Document Revised: 07/18/2013 Document Reviewed: 09/11/2011 °[employer]® Patient Information ©2015 [employer]. This information is not intended to replace  advice given to you by your health care provider. Make sure you discuss any questions you have with your health care provider. ° °

## 2015-02-27 ENCOUNTER — Emergency Department (HOSPITAL_COMMUNITY): Payer: BLUE CROSS/BLUE SHIELD

## 2015-02-27 ENCOUNTER — Ambulatory Visit (HOSPITAL_BASED_OUTPATIENT_CLINIC_OR_DEPARTMENT_OTHER): Payer: BLUE CROSS/BLUE SHIELD

## 2015-02-27 ENCOUNTER — Encounter (HOSPITAL_COMMUNITY): Payer: Self-pay | Admitting: *Deleted

## 2015-02-27 ENCOUNTER — Inpatient Hospital Stay (HOSPITAL_COMMUNITY): Payer: BLUE CROSS/BLUE SHIELD

## 2015-02-27 ENCOUNTER — Inpatient Hospital Stay (HOSPITAL_COMMUNITY)
Admission: EM | Admit: 2015-02-27 | Discharge: 2015-03-01 | DRG: 243 | Disposition: A | Discharge status: Home / Self Care | Payer: BLUE CROSS/BLUE SHIELD | Attending: Internal Medicine | Admitting: Internal Medicine

## 2015-02-27 DIAGNOSIS — I462 Cardiac arrest due to underlying cardiac condition: Secondary | ICD-10-CM | POA: Diagnosis present

## 2015-02-27 DIAGNOSIS — I469 Cardiac arrest, cause unspecified: Secondary | ICD-10-CM

## 2015-02-27 DIAGNOSIS — I4581 Long QT syndrome: Secondary | ICD-10-CM | POA: Diagnosis present

## 2015-02-27 DIAGNOSIS — R55 Syncope and collapse: Secondary | ICD-10-CM | POA: Diagnosis present

## 2015-02-27 DIAGNOSIS — G43909 Migraine, unspecified, not intractable, without status migrainosus: Secondary | ICD-10-CM | POA: Diagnosis present

## 2015-02-27 DIAGNOSIS — I248 Other forms of acute ischemic heart disease: Secondary | ICD-10-CM | POA: Diagnosis present

## 2015-02-27 DIAGNOSIS — E876 Hypokalemia: Secondary | ICD-10-CM | POA: Diagnosis not present

## 2015-02-27 DIAGNOSIS — I472 Ventricular tachycardia: Principal | ICD-10-CM

## 2015-02-27 DIAGNOSIS — I4901 Ventricular fibrillation: Secondary | ICD-10-CM | POA: Diagnosis not present

## 2015-02-27 DIAGNOSIS — I4721 Torsades de pointes: Secondary | ICD-10-CM

## 2015-02-27 DIAGNOSIS — Z959 Presence of cardiac and vascular implant and graft, unspecified: Secondary | ICD-10-CM

## 2015-02-27 DIAGNOSIS — Z7982 Long term (current) use of aspirin: Secondary | ICD-10-CM | POA: Diagnosis not present

## 2015-02-27 LAB — TROPONIN I
TROPONIN I: 0.09 ng/mL — AB (ref <0.031)
Troponin I: 0.07 ng/mL — ABNORMAL HIGH (ref <0.031)
Troponin I: 0.07 ng/mL — ABNORMAL HIGH (ref <0.031)

## 2015-02-27 LAB — BASIC METABOLIC PANEL
Anion gap: 11 (ref 5–15)
BUN: 6 mg/dL (ref 6–20)
CALCIUM: 8.8 mg/dL — AB (ref 8.9–10.3)
CHLORIDE: 107 mmol/L (ref 101–111)
CO2: 21 mmol/L — ABNORMAL LOW (ref 22–32)
CREATININE: 0.8 mg/dL (ref 0.44–1.00)
GFR calc Af Amer: >60 mL/min (ref >60)
GFR calc non Af Amer: >60 mL/min (ref >60)
Glucose, Bld: 139 mg/dL — ABNORMAL HIGH (ref 65–99)
Potassium: 3.6 mmol/L (ref 3.5–5.1)
SODIUM: 139 mmol/L (ref 135–145)

## 2015-02-27 LAB — GLUCOSE, CAPILLARY
GLUCOSE-CAPILLARY: 104 mg/dL — AB (ref 65–99)
Glucose-Capillary: 93 mg/dL (ref 65–99)
Glucose-Capillary: 119 mg/dL — ABNORMAL HIGH (ref 65–99)

## 2015-02-27 LAB — COMPREHENSIVE METABOLIC PANEL
ALT: 104 U/L — ABNORMAL HIGH (ref 14–54)
AST: 83 U/L — ABNORMAL HIGH (ref 15–41)
Albumin: 3.6 g/dL (ref 3.5–5.0)
Alkaline Phosphatase: 77 U/L (ref 38–126)
Anion gap: 9 (ref 5–15)
BILIRUBIN TOTAL: 0.5 mg/dL (ref 0.3–1.2)
BUN: 5 mg/dL — ABNORMAL LOW (ref 6–20)
CO2: 23 mmol/L (ref 22–32)
CREATININE: 0.79 mg/dL (ref 0.44–1.00)
Calcium: 8.8 mg/dL — ABNORMAL LOW (ref 8.9–10.3)
Chloride: 107 mmol/L (ref 101–111)
GFR calc Af Amer: >60 mL/min (ref >60)
GFR calc non Af Amer: >60 mL/min (ref >60)
GLUCOSE: 116 mg/dL — AB (ref 65–99)
Potassium: 3.9 mmol/L (ref 3.5–5.1)
Sodium: 139 mmol/L (ref 135–145)
TOTAL PROTEIN: 5.9 g/dL — AB (ref 6.5–8.1)

## 2015-02-27 LAB — CBC WITH DIFFERENTIAL/PLATELET
BASOS PCT: 0 % (ref 0–1)
Basophils Absolute: 0 10*3/uL (ref 0.0–0.1)
EOS ABS: 0 10*3/uL (ref 0.0–0.7)
Eosinophils Relative: 0 % (ref 0–5)
HCT: 41.2 % (ref 36.0–46.0)
Hemoglobin: 14.1 g/dL (ref 12.0–15.0)
LYMPHS PCT: 12 % (ref 12–46)
Lymphs Abs: 1.4 10*3/uL (ref 0.7–4.0)
MCH: 28.4 pg (ref 26.0–34.0)
MCHC: 34.2 g/dL (ref 30.0–36.0)
MCV: 83.1 fL (ref 78.0–100.0)
Monocytes Absolute: 0.6 10*3/uL (ref 0.1–1.0)
Monocytes Relative: 5 % (ref 3–12)
Neutro Abs: 9.8 10*3/uL — ABNORMAL HIGH (ref 1.7–7.7)
Neutrophils Relative %: 83 % — ABNORMAL HIGH (ref 43–77)
Platelets: 263 10*3/uL (ref 150–400)
RBC: 4.96 MIL/uL (ref 3.87–5.11)
RDW: 12.8 % (ref 11.5–15.5)
WBC: 11.9 10*3/uL — ABNORMAL HIGH (ref 4.0–10.5)

## 2015-02-27 LAB — MRSA PCR SCREENING: MRSA BY PCR: NEGATIVE

## 2015-02-27 LAB — LIPASE, BLOOD: Lipase: 27 U/L (ref 22–51)

## 2015-02-27 LAB — TSH: TSH: 0.488 u[IU]/mL (ref 0.350–4.500)

## 2015-02-27 LAB — PHOSPHORUS: PHOSPHORUS: 2.9 mg/dL (ref 2.5–4.6)

## 2015-02-27 LAB — MAGNESIUM: Magnesium: 1.9 mg/dL (ref 1.7–2.4)

## 2015-02-27 LAB — AMYLASE: Amylase: 19 U/L — ABNORMAL LOW (ref 28–100)

## 2015-02-27 MED ORDER — ACETAMINOPHEN 650 MG RE SUPP: 650.0000 mg | Freq: Four times a day (QID) | RECTAL | Status: DC | PRN

## 2015-02-27 MED ORDER — SODIUM CHLORIDE 0.9 % IJ SOLN: 3.0000 mL | Freq: Two times a day (BID) | INTRAMUSCULAR | Status: DC

## 2015-02-27 MED ORDER — ENOXAPARIN SODIUM 40 MG/0.4ML ~~LOC~~ SOLN
40.0000 mg | Freq: Once | SUBCUTANEOUS | Status: AC
  Administered 2015-02-27: 40 mg via SUBCUTANEOUS
  Filled 2015-02-27: qty 0.4

## 2015-02-27 MED ORDER — SODIUM CHLORIDE 0.9 % IJ SOLN: 3.0000 mL | INTRAMUSCULAR | Status: DC | PRN

## 2015-02-27 MED ORDER — NADOLOL 40 MG PO TABS
40.0000 mg | ORAL_TABLET | Freq: Two times a day (BID) | ORAL | Status: DC
  Administered 2015-02-27 – 2015-03-01 (×3): 40 mg via ORAL
  Filled 2015-02-27 (×6): qty 1

## 2015-02-27 MED ORDER — ACETAMINOPHEN 325 MG PO TABS
650.0000 mg | ORAL_TABLET | Freq: Four times a day (QID) | ORAL | Status: DC | PRN
  Administered 2015-02-27 – 2015-02-28 (×2): 650 mg via ORAL
  Filled 2015-02-27 (×2): qty 2

## 2015-02-27 MED ORDER — ONDANSETRON HCL 4 MG PO TABS: 4.0000 mg | ORAL_TABLET | Freq: Four times a day (QID) | ORAL | Status: DC | PRN

## 2015-02-27 MED ORDER — ASPIRIN 81 MG PO CHEW
81.0000 mg | CHEWABLE_TABLET | ORAL | Status: AC
  Administered 2015-02-28: 81 mg via ORAL
  Filled 2015-02-27: qty 1

## 2015-02-27 MED ORDER — METOPROLOL TARTRATE 1 MG/ML IV SOLN
5.0000 mg | Freq: Once | INTRAVENOUS | Status: AC
  Administered 2015-02-27: 5 mg via INTRAVENOUS
  Filled 2015-02-27: qty 5

## 2015-02-27 MED ORDER — MAGNESIUM SULFATE 2 GM/50ML IV SOLN
2.0000 g | Freq: Once | INTRAVENOUS | Status: AC
  Administered 2015-02-27: 2 g via INTRAVENOUS
  Filled 2015-02-27: qty 50

## 2015-02-27 MED ORDER — ASPIRIN 81 MG PO CHEW
81.0000 mg | CHEWABLE_TABLET | Freq: Every day | ORAL | Status: DC
  Administered 2015-02-27: 81 mg via ORAL
  Filled 2015-02-27 (×2): qty 1

## 2015-02-27 MED ORDER — ENOXAPARIN SODIUM 40 MG/0.4ML ~~LOC~~ SOLN
40.0000 mg | SUBCUTANEOUS | Status: DC
  Filled 2015-02-27: qty 0.4

## 2015-02-27 MED ORDER — SODIUM CHLORIDE 0.9 % IV SOLN: 250.0000 mL | INTRAVENOUS | Status: DC | PRN

## 2015-02-27 MED ORDER — SODIUM CHLORIDE 0.9 % WEIGHT BASED INFUSION: 1.0000 mL/kg/h | INTRAVENOUS | Status: DC

## 2015-02-27 MED ORDER — SODIUM CHLORIDE 0.9 % WEIGHT BASED INFUSION
3.0000 mL/kg/h | INTRAVENOUS | Status: DC
Start: 2015-02-28 — End: 2015-02-28
  Administered 2015-02-28: 3 mL/kg/h via INTRAVENOUS

## 2015-02-27 MED ORDER — ONDANSETRON HCL 4 MG/2ML IJ SOLN: 4.0000 mg | Freq: Four times a day (QID) | INTRAMUSCULAR | Status: DC | PRN

## 2015-02-27 MED ORDER — ENOXAPARIN SODIUM 40 MG/0.4ML ~~LOC~~ SOLN: 40.0000 mg | SUBCUTANEOUS | Status: DC

## 2015-02-27 MED ORDER — SODIUM CHLORIDE 0.9 % IV SOLN
INTRAVENOUS | Status: DC
Start: 2015-02-27 — End: 2015-02-28
  Administered 2015-02-27: 11:00:00 via INTRAVENOUS

## 2015-02-27 MED FILL — Medication: Qty: 1 | Status: AC

## 2015-02-27 NOTE — Progress Notes (Signed)
Patient ID: Teresa Henry, female   DOB: June 10, 1966, 49 y.o.   MRN: [id number] Patient doing well No PVC;s post Mg Will arrange cath with Dr Tamala Julian in am 9:00 Discussed procedure with her willing to proceed.   Orders written Lab called  Echo is normal except for septal thickness 14 mm   Jenkins Rouge

## 2015-02-27 NOTE — Progress Notes (Signed)
Patient ID: Teresa Henry, female   DOB: [birth year], 49 y.o.   MRN: [id number] Discussed with Dr Lovena Le He advised giving her 5 mg iv lopressor And Mg even if level normal Discussed with nurse and family Echo tech at bedside  [employer]

## 2015-02-27 NOTE — Progress Notes (Signed)
While in the patient's room talking with the patient-patient proceeded to have "seizure" like activity, turned cyanotic and became unresponsive. Code Blue was called-chest compressions were started, following which patient regained pulse,conciousness spontaneously. CBG 93, vitals signs were stable. Telemetry-showed rhythm consistent with Torsades. Mag Sulf was given, cards, PCCM consulted-transferred to the ICU. Husband updated over the phone.

## 2015-02-27 NOTE — ED Notes (Signed)
Dr. Patel at bedside 

## 2015-02-27 NOTE — ED Notes (Signed)
Patient transported to CT 

## 2015-02-27 NOTE — Progress Notes (Signed)
Pt transferred to 68M, rm 06, per dr, post code episode in rm 5W11. Pt alert and oriented and SR at time of transfer. Pt report given to receiving nurse @ 0828. 02/27/2015 10:30 AM Bauer Ausborn

## 2015-02-27 NOTE — Consult Note (Addendum)
Reason for Consult:syncope with documented PMVT/VF/TDP  Referring Physician: Dr. Elicia Lamp is an 49 y.o. female.   HPI: The patient is a 49 yo woman who has been healthy. She notes 2 syncopal episodes in college for which she did not seek medical attention. One occurred in Calculus class and the other while she was in her dorm. She has passed out rarely in the past. She had an episode of syncope yesterday while meeting with "the granite guy" and spontaneously recovered. She did not experience palpitations. She suddenly became dizzy. On awakening she was weak but no loss of bowel or bladder incontinence and no tongue biting. No chest pressure or sob. She went to the ER and was hydrated and sent home. Her ECG was not particularly concerning. She was sleeping last night when her huband noted that she was not breathing appropriately and was seen to have seizure like activity. She remembers awakening when her husband was talking to the paramedics. She was admitted and overnight noted to have lots of PVC's/NSVT. This morning she passed out again and was noted to be in PMVT/VF. CPR was begun and after approx. 30 seconds she had spontaneous return of her circulation and rhythm. She has undergone 2D echo which demonstrated normal LV function. No wall motion abnormalities. Her ECG's demonstrate NSR with prolonged QT but not tremendously so. I reviewed her rhythm strips which are of poor quality and difficult because of artifact but her QT demonstrated what looked like very prolonged QT interval. PVC's preceeded her event. There is no family history of sudden death. Her brother has CAD.   PMH: Past Medical History  Diagnosis Date  . Frequent episodic tension-type headache     PSHX: Past Surgical History  Procedure Laterality Date  . Breast biopsy  1999    Needle Biopsy- Non cancer    FAMHX: Family History  Problem Relation Age of Onset  . Arthritis Mother   . Hypertension Mother   .  Arthritis Father   . Hypertension Father   . Stroke Maternal Grandmother   . Stroke Paternal Grandmother   . Heart attack Brother 54  no sudden death  Social History:  reports that she has never smoked. She has never used smokeless tobacco. She reports that she does not drink alcohol or use illicit drugs.  Allergies: No Known Allergies  Medications: I have reviewed the patient's current medications. There are no QT prolonging drugs or supplements.  Dg Chest 2 View  02/27/2015   CLINICAL DATA:  Syncope twice today. Patient was in the emergency department earlier today following the first episode.  EXAM: CHEST  2 VIEW  COMPARISON:  None  FINDINGS: There is borderline cardiomegaly. The lungs are clear. There are no pleural effusions. Hilar, mediastinal and cardiac contours are unremarkable.  IMPRESSION: No active cardiopulmonary disease.   Electronically Signed   By: Andreas Newport M.D.   On: 02/27/2015 02:08   Ct Head Wo Contrast  02/27/2015   CLINICAL DATA:  Acute onset of syncope.  Initial encounter.  EXAM: CT HEAD WITHOUT CONTRAST  TECHNIQUE: Contiguous axial images were obtained from the base of the skull through the vertex without intravenous contrast.  COMPARISON:  None.  FINDINGS: There is no evidence of acute infarction, mass lesion, or intra- or extra-axial hemorrhage on CT.  The posterior fossa, including the cerebellum, brainstem and fourth ventricle, is within normal limits. The third and lateral ventricles, and basal ganglia are unremarkable in appearance. The cerebral  hemispheres are symmetric in appearance, with normal gray-white differentiation. No mass effect or midline shift is seen.  There is no evidence of fracture; visualized osseous structures are unremarkable in appearance. The orbits are within normal limits. The paranasal sinuses and mastoid air cells are well-aerated. No significant soft tissue abnormalities are seen.  IMPRESSION: Unremarkable noncontrast CT of the head.    Electronically Signed   By: Garald Balding M.D.   On: 02/27/2015 02:04    ROS  As stated in the HPI and negative for all other systems.  Physical Exam  Vitals:Blood pressure 130/71, pulse 72, temperature 99.1 F (37.3 C), temperature source Oral, resp. rate 19, height 5\' 3"  (1.6 m), weight 155 lb 3.3 oz (70.4 kg), last menstrual period 02/25/2015, SpO2 100 %.  Well appearing middle aged woman, NAD HEENT: Unremarkable Neck:  No JVD, no thyromegally Back:  No CVA tenderness Lungs:  Clear with no wheezes HEART:  Regular rate rhythm, no murmurs, no rubs, no clicks Abd:  Flat, positive bowel sounds, no organomegally, no rebound, no guarding Ext:  2 plus pulses, no edema, no cyanosis, no clubbing Skin:  No rashes no nodules Neuro:  CN II through XII intact, motor grossly intact  ECG - NSR with QTC 500  Assessment/Plan: 1. Recurrent syncope/aborted sudden death 2. PMVT/VF/tdP 3. Prolonged QT interval Rec: With her family history of CAD, cardiac catheterization is reasonable. Would continue beta blocker although will switch to Nadolol. Will observe on Tele. She will likely need an ICD as she has really had aborted sudden death. I am puzzled by the transient nature of her QT prolongation. Review of an ECG back in 2013 was unremarkable for QT prolongation.  Carleene Overlie TaylorMD 02/27/2015, 8:06 PM     Addendum  I have gone back to review the telemetry strips. The patient was in VF for over 2 minutes and was defibrillated with a single shock, restoring NSR. She has mostly settled down since receiving IV magnesium and IV metoprolol. Review of the QT interval demonstrates that the QT is only around 460, and the long coupled interval is not very long. In addition, there is no ST depresion. There is minimal T wave alternation on some of her beats. Prior to the event, she had several brief episodes of non-sustained PMVT.  Mikle Bosworth.D.

## 2015-02-27 NOTE — Progress Notes (Signed)
*  PRELIMINARY RESULTS* Echocardiogram 2D Echocardiogram has been performed.  Teresa Henry 02/27/2015, 11:55 AM

## 2015-02-27 NOTE — ED Notes (Signed)
Report attempted 

## 2015-02-27 NOTE — ED Provider Notes (Signed)
CSN: [id number]     Arrival date & time 02/27/15  0049 History   This chart was scribed for Blanchie Dessert, MD by Forrestine Him, ED Scribe. This patient was seen in room D31C/D31C and the patient's care was started 1:23 AM.   Chief Complaint  Patient presents with  . Loss of Consciousness   The history is provided by the patient. No language interpreter was used.    HPI Comments: Teresa Henry brought in by EMS is a 49 y.o. female without any pertinent past medical history who presents to the Emergency Department here after an episode of loss of consciousness and witnessed fall earlier this evening. Pt was evaluated at 4:00 PM 8/2 after a syncopal episode. Teresa Henry reports a second syncopal episode this evening after going to bed. She remembers feelings "funny in my head" and does not recall any other details. Husband reports rapid breathing, redness to the face, along with her eyes rolling in the back of her head during episode. Slowly pt returned to baseline after approximately 5 minutes. She was given 4 mg of Zofran en route to department after 2 episodes of vomiting with associated nausea. No recent fever, chills, shortness of breath, abdominal pain, or chest pain. No weakness or numbness. Pt reports 2 previous syncopal episodes while in college and several years later associated severe abdominal pain. Pt takes a baby ASA at nighttime for borderline high cholesterol. Denies any alcohol consumption or smoking habits. No history of breast cancer but admits to previous benign lumpectomy 16 years ago. No known allergies to medications.  Past Medical History  Diagnosis Date  . Frequent episodic tension-type headache    Past Surgical History  Procedure Laterality Date  . Breast biopsy  1999    Needle Biopsy- Non cancer   Family History  Problem Relation Age of Onset  . Arthritis Mother   . Hypertension Mother   . Arthritis Father   . Hypertension Father   . Stroke Maternal  Grandmother   . Stroke Paternal Grandmother   . Heart attack Brother 33   History  Substance Use Topics  . Smoking status: Never Smoker   . Smokeless tobacco: Never Used  . Alcohol Use: No   OB History    No data available     Review of Systems  Constitutional: Negative for fever and chills.  Respiratory: Negative for cough and shortness of breath.   Cardiovascular: Negative for chest pain.  Gastrointestinal: Positive for nausea and vomiting. Negative for abdominal pain and diarrhea.  Neurological: Positive for syncope. Negative for weakness, numbness and headaches.  Psychiatric/Behavioral: Negative for confusion.  All other systems reviewed and are negative.     Allergies  Review of patient's allergies indicates no known allergies.  Home Medications   Prior to Admission medications   Medication Sig Start Date End Date Taking? Authorizing Provider  aspirin 81 MG tablet Take 81 mg by mouth daily.    Historical Provider, MD   Triage Vitals: BP 110/66 mmHg  Pulse 75  Temp(Src) 98.7 F (37.1 C) (Oral)  Resp 18  SpO2 99%  LMP 02/25/2015 (Exact Date)   Physical Exam  Constitutional: She is oriented to person, place, and time. She appears well-developed and well-nourished. No distress.  HENT:  Head: Normocephalic and atraumatic.  Eyes: Conjunctivae and EOM are normal. Pupils are equal, round, and reactive to light.  Neck: Normal range of motion.  Cardiovascular: Normal rate, regular rhythm, normal heart sounds and intact distal pulses.  No murmur heard. Pulmonary/Chest: Effort normal and breath sounds normal. No respiratory distress. She has no wheezes. She has no rales.  Abdominal: Soft. She exhibits no distension. There is no tenderness.  Musculoskeletal: Normal range of motion.  Neurological: She is alert and oriented to person, place, and time. She has normal strength. No cranial nerve deficit or sensory deficit. Coordination normal.  5 out of 5 strength in all 4  extremities   Skin: Skin is warm and dry.  Psychiatric: She has a normal mood and affect. Judgment normal.  Nursing note and vitals reviewed.   ED Course  Procedures (including critical care time)  DIAGNOSTIC STUDIES: Oxygen Saturation is 99% on RA, Normal by my interpretation.    COORDINATION OF CARE: 1:23 AM-Discussed treatment plan with pt at bedside and pt agreed to plan.     Labs Review Labs Reviewed - No data to display  Imaging Review Dg Chest 2 View  02/27/2015   CLINICAL DATA:  Syncope twice today. Patient was in the emergency department earlier today following the first episode.  EXAM: CHEST  2 VIEW  COMPARISON:  None  FINDINGS: There is borderline cardiomegaly. The lungs are clear. There are no pleural effusions. Hilar, mediastinal and cardiac contours are unremarkable.  IMPRESSION: No active cardiopulmonary disease.   Electronically Signed   By: Andreas Newport M.D.   On: 02/27/2015 02:08   Ct Head Wo Contrast  02/27/2015   CLINICAL DATA:  Acute onset of syncope.  Initial encounter.  EXAM: CT HEAD WITHOUT CONTRAST  TECHNIQUE: Contiguous axial images were obtained from the base of the skull through the vertex without intravenous contrast.  COMPARISON:  None.  FINDINGS: There is no evidence of acute infarction, mass lesion, or intra- or extra-axial hemorrhage on CT.  The posterior fossa, including the cerebellum, brainstem and fourth ventricle, is within normal limits. The third and lateral ventricles, and basal ganglia are unremarkable in appearance. The cerebral hemispheres are symmetric in appearance, with normal gray-white differentiation. No mass effect or midline shift is seen.  There is no evidence of fracture; visualized osseous structures are unremarkable in appearance. The orbits are within normal limits. The paranasal sinuses and mastoid air cells are well-aerated. No significant soft tissue abnormalities are seen.  IMPRESSION: Unremarkable noncontrast CT of the head.    Electronically Signed   By: Garald Balding M.D.   On: 02/27/2015 02:04     EKG Interpretation   Date/Time:  Wednesday February 27 2015 01:06:11 EDT Ventricular Rate:  78 PR Interval:  163 QRS Duration: 96 QT Interval:  428 QTC Calculation: 487 R Axis:   26 Text Interpretation:  Sinus rhythm Borderline prolonged QT interval No  significant change since last tracing Confirmed by Maryan Rued  MD, Loree Fee  [id number]) on 02/27/2015 1:22:10 AM      MDM   Final diagnoses:  Syncope    Patient returning to the emergency room this evening now with her second episode of loss of consciousness. She was lying in bed when she felt an indescribable sensation in her head and has been states she started breathing rapidly and then became apneic with redness of her face. She denies any palpitations, chest pain, shortness of breath, unilateral leg pain or swelling.  No symptoms concerning for a PE at this time and no risk factors. She has no prior heart history and EKG is unchanged. No prior history of breast cancer and no unilateral weakness or numbness. She does get headaches frequently but states they have  not been any different than normal.  She takes no prescription medications regularly.  Exam without acute findings. Patient had blood work done earlier today which showed a normal CBC BMP, hCG and UA were all within normal limits.  We'll get a head CT and a chest x-ray tonight but do not feel that those labs need to be repeated as they were just done less than 12 hours ago.  2:37 AM Head CT wnl.    I personally performed the services described in this documentation, which was scribed in my presence.  The recorded information has been reviewed and considered.   Blanchie Dessert, MD 02/27/15 [phone]

## 2015-02-27 NOTE — Progress Notes (Signed)
Patient arrived alert and oriented, follows commands. Vitals stable and 100% on NRB.

## 2015-02-27 NOTE — Progress Notes (Signed)
  Pt admitted to the unit. Pt is stable, alert and oriented per baseline. Oriented to room, staff, and call bell. Educated to call for any assistance. Bed in lowest position, call bell within reach- will continue to monitor. 

## 2015-02-27 NOTE — Progress Notes (Signed)
Patient ID: Teresa Henry, female   DOB: 06/15/1966, 49 y.o.   MRN: [id number] CARDIOLOGY CONSULT NOTE       Patient ID: Teresa Henry MRN: [id number] DOB/AGE: [birth year] 49 y.o.  Admit date: 02/27/2015 Referring Physician:  Cordelia Pen Primary Physician: Garnet Koyanagi, DO Primary Cardiologist:  Lovena Le Reason for Consultation:  Syncope / torsades   Principal Problem:   Syncopal episodes   HPI:  49 y.o. admitted with syncope.  No previously documented structural heart disease.  She has a brother with CAD who helps run [facility].  No family history of sudden death.  History of migraines with no meds or QT prolonging drugs at home.  While on telemetry had documented torsades with syncope. Dr Cordelia Pen was interviewing patient and she had "seizure " activity.  Chest compressions started but no shock delivered and she converted spontaneously  Currently chest a bit sore from compressions. No dyspnea Has R/O ECG with QT 508 no ischemic changes  F/U K and Mg pending  .  Last K this am 3.9   ROS All other systems reviewed and negative except as noted above  Past Medical History  Diagnosis Date  . Frequent episodic tension-type headache     Family History  Problem Relation Age of Onset  . Arthritis Mother   . Hypertension Mother   . Arthritis Father   . Hypertension Father   . Stroke Maternal Grandmother   . Stroke Paternal Grandmother   . Heart attack Brother 45    History   Social History  . Marital Status: Married    Spouse Name: N/A  . Number of Children: N/A  . Years of Education: N/A   Occupational History  . Agricultural engineer-- self employed    Social History Main Topics  . Smoking status: Never Smoker   . Smokeless tobacco: Never Used  . Alcohol Use: No  . Drug Use: No  . Sexual Activity:    Partners: Male   Other Topics Concern  . Not on file   Social History Narrative    Past Surgical History  Procedure Laterality Date  . Breast biopsy  1999   Needle Biopsy- Non cancer     . aspirin  81 mg Oral Daily  . enoxaparin (LOVENOX) injection  40 mg Subcutaneous Q24H  . sodium chloride  3 mL Intravenous Q12H   . sodium chloride 50 mL/hr at 02/27/15 1034    Physical Exam: Blood pressure 126/73, pulse 84, temperature 98.4 F (36.9 C), temperature source Oral, resp. rate 19, height 5\' 3"  (1.6 m), weight 70.4 kg (155 lb 3.3 oz), last menstrual period 02/25/2015, SpO2 100 %.   Affect appropriate Healthy:  appears stated age 21: normal Neck supple with no adenopathy JVP normal no bruits no thyromegaly Lungs clear with no wheezing and good diaphragmatic motion Heart:  S1/S2 no murmur, no rub, gallop or click PMI normal Abdomen: benighn, BS positve, no tenderness, no AAA no bruit.  No HSM or HJR Distal pulses intact with no bruits No edema Neuro non-focal Skin warm and dry No muscular weakness   Labs:   Lab Results  Component Value Date   WBC 11.9* 02/27/2015   HGB 14.1 02/27/2015   HCT 41.2 02/27/2015   MCV 83.1 02/27/2015   PLT 263 02/27/2015    Recent Labs Lab 02/27/15 0415  NA 139  K 3.9  CL 107  CO2 23  BUN 5*  CREATININE 0.79  CALCIUM 8.8*  PROT 5.9*  BILITOT 0.5  ALKPHOS 77  ALT 104*  AST 83*  GLUCOSE 116*   No results found for: CKTOTAL, CKMB, CKMBINDEX, TROPONINI Lab Results  Component Value Date   CHOL 153 05/21/2014   CHOL 178 05/15/2013   CHOL 183 04/19/2012   Lab Results  Component Value Date   HDL 34.80* 05/21/2014   HDL 40.80 05/15/2013   HDL 34.30* 04/19/2012   Lab Results  Component Value Date   LDLCALC 105* 05/21/2014   LDLCALC 117* 05/15/2013   LDLCALC 126* 04/19/2012   Lab Results  Component Value Date   TRIG 66.0 05/21/2014   TRIG 99.0 05/15/2013   TRIG 114.0 04/19/2012   Lab Results  Component Value Date   CHOLHDL 4 05/21/2014   CHOLHDL 4 05/15/2013   CHOLHDL 5 04/19/2012   No results found for: LDLDIRECT    Radiology: Dg Chest 2 View  02/27/2015   CLINICAL  DATA:  Syncope twice today. Patient was in the emergency department earlier today following the first episode.  EXAM: CHEST  2 VIEW  COMPARISON:  None  FINDINGS: There is borderline cardiomegaly. The lungs are clear. There are no pleural effusions. Hilar, mediastinal and cardiac contours are unremarkable.  IMPRESSION: No active cardiopulmonary disease.   Electronically Signed   By: Andreas Newport M.D.   On: 02/27/2015 02:08   Ct Head Wo Contrast  02/27/2015   CLINICAL DATA:  Acute onset of syncope.  Initial encounter.  EXAM: CT HEAD WITHOUT CONTRAST  TECHNIQUE: Contiguous axial images were obtained from the base of the skull through the vertex without intravenous contrast.  COMPARISON:  None.  FINDINGS: There is no evidence of acute infarction, mass lesion, or intra- or extra-axial hemorrhage on CT.  The posterior fossa, including the cerebellum, brainstem and fourth ventricle, is within normal limits. The third and lateral ventricles, and basal ganglia are unremarkable in appearance. The cerebral hemispheres are symmetric in appearance, with normal gray-white differentiation. No mass effect or midline shift is seen.  There is no evidence of fracture; visualized osseous structures are unremarkable in appearance. The orbits are within normal limits. The paranasal sinuses and mastoid air cells are well-aerated. No significant soft tissue abnormalities are seen.  IMPRESSION: Unremarkable noncontrast CT of the head.   Electronically Signed   By: Garald Balding M.D.   On: 02/27/2015 02:04    EKG:  SR rate 81 QT 438/508  Otherwise normal   ASSESSMENT AND PLAN:   Syncope:  Telemetry clearly shows long bout of polymorphic VT.  Will discuss with Dr Lovena Le.  Currently stable in NSR with occasional PVC and not bradycardic  K fine early this am and not on diuretic  Repeat K and Mg pending.  Not clear that she needs amiodarone or lidocaine at this time have called echo lab to do bedside and will need to r/o CAD   However I suspect she will need and  AICD given her clinical syndrome.  No high risk family history and other than long QT ECG shows now evidence of CAD or structural heart disease.    Discussed at length with family and brother .  Jenkins Rouge  Signed: Jenkins Rouge 02/27/2015, 10:38 AM

## 2015-02-27 NOTE — Consult Note (Signed)
PULMONARY / CRITICAL CARE MEDICINE   Name: LITISHA GUAGLIARDO MRN: [id number] DOB: August 08, 1965    ADMISSION DATE:  02/27/2015 CONSULTATION DATE:  Caroleen Hamman  REFERRING MD :  Ghimire ([facility])   CHIEF COMPLAINT:  Cardiac arrest -torsades   INITIAL PRESENTATION: 49yo female with only PMH migraines presented 8/3 with 2 episodes syncope.  She was admitted by [facility] and on Dr. Nena Alexander morning exam she was awake and alert, discussing plan of care when she became acutely unresponsive then cyanotic.  Code blue called, approx 20 secs CPR and 1 shock for what was thought to be VFib with quick ROSC and back to NSR.  On further inspection EKG c/w torsades.  PCCM consulted for tx ICU.   STUDIES:  2D echo 8/3>>> CT head 8/3>>> neg acute   SIGNIFICANT EVENTS:    HISTORY OF PRESENT ILLNESS: 49yo female with only PMH migraines presented 8/3 with 2 episodes syncope.  She was admitted by [facility] and on Dr. Nena Alexander morning exam she was awake and alert, discussing plan of care when she suddenly became unresponsive then cyanotic.  Code blue called, approx 20 secs CPR and 1 shock for what was thought to be VFib with quick ROSC and back to NSR.  On further inspection EKG c/w torsades.  PCCM consulted for tx ICU.  Pt currently denies SOB, headache, visual disturbances, lightheadedness.  Does c/o nausea, chest discomfort post CPR  PAST MEDICAL HISTORY :   has a past medical history of Frequent episodic tension-type headache.  has past surgical history that includes Breast biopsy (1999). Prior to Admission medications   Medication Sig Start Date End Date Taking? Authorizing Provider  aspirin 81 MG tablet Take 81 mg by mouth daily.   Yes Historical Provider, MD   No Known Allergies  FAMILY HISTORY:  indicated that her mother is alive. She indicated that her father is alive. She indicated that her brother is alive.  SOCIAL HISTORY:  reports that she has never smoked. She has never used smokeless tobacco. She reports that  she does not drink alcohol or use illicit drugs.  REVIEW OF SYSTEMS:  As per HPI - All other systems reviewed and were neg.   SUBJECTIVE:   VITAL SIGNS: Temp:  [97.6 F (36.4 C)-98.7 F (37.1 C)] 98.4 F (36.9 C) (08/03 0341) Pulse Rate:  [71-84] 84 (08/03 0843) Resp:  [15-24] 16 (08/03 0315) BP: (98-123)/(51-76) 117/69 mmHg (08/03 0843) SpO2:  [96 %-100 %] 100 % (08/03 0843) Weight:  [155 lb (70.308 kg)-155 lb 3.3 oz (70.4 kg)] 155 lb 3.3 oz (70.4 kg) (08/03 0341) HEMODYNAMICS:   VENTILATOR SETTINGS:   INTAKE / OUTPUT: No intake or output data in the 24 hours ending 02/27/15 0850  PHYSICAL EXAMINATION: General:  wdwn female, NAD in bed  Neuro:  Awake, alert, appropriate, anxious  HEENT:  Mm moist, NRB, no JVD  Cardiovascular:  s1s2 rrr, no m/r/g Lungs:  resps even non labored on NRB, sats 100%, cta  Abdomen:  Round, soft, non tender, +bs  Musculoskeletal:  Warm and dry, no edema   LABS:  CBC  Recent Labs Lab 02/26/15 1632 02/27/15 0415  WBC 9.9 11.9*  HGB 14.7 14.1  HCT 43.2 41.2  PLT 235 263   Coag's No results for input(s): APTT, INR in the last 168 hours. BMET  Recent Labs Lab 02/26/15 1632 02/27/15 0415  NA 138 139  K 3.4* 3.9  CL 101 107  CO2 25 23  BUN 9 5*  CREATININE 0.94 0.79  GLUCOSE 107* 116*   Electrolytes  Recent Labs Lab 02/26/15 1632 02/27/15 0415  CALCIUM 8.9 8.8*   Sepsis Markers No results for input(s): LATICACIDVEN, PROCALCITON, O2SATVEN in the last 168 hours. ABG No results for input(s): PHART, PCO2ART, PO2ART in the last 168 hours. Liver Enzymes  Recent Labs Lab 02/27/15 0415  AST 83*  ALT 104*  ALKPHOS 77  BILITOT 0.5  ALBUMIN 3.6   Cardiac Enzymes No results for input(s): TROPONINI, PROBNP in the last 168 hours. Glucose  Recent Labs Lab 02/26/15 1630 02/27/15 0740 02/27/15 0822  GLUCAP 112* 104* 93    Imaging Dg Chest 2 View  02/27/2015   CLINICAL DATA:  Syncope twice today. Patient was in the  emergency department earlier today following the first episode.  EXAM: CHEST  2 VIEW  COMPARISON:  None  FINDINGS: There is borderline cardiomegaly. The lungs are clear. There are no pleural effusions. Hilar, mediastinal and cardiac contours are unremarkable.  IMPRESSION: No active cardiopulmonary disease.   Electronically Signed   By: Andreas Newport M.D.   On: 02/27/2015 02:08   Ct Head Wo Contrast  02/27/2015   CLINICAL DATA:  Acute onset of syncope.  Initial encounter.  EXAM: CT HEAD WITHOUT CONTRAST  TECHNIQUE: Contiguous axial images were obtained from the base of the skull through the vertex without intravenous contrast.  COMPARISON:  None.  FINDINGS: There is no evidence of acute infarction, mass lesion, or intra- or extra-axial hemorrhage on CT.  The posterior fossa, including the cerebellum, brainstem and fourth ventricle, is within normal limits. The third and lateral ventricles, and basal ganglia are unremarkable in appearance. The cerebral hemispheres are symmetric in appearance, with normal gray-white differentiation. No mass effect or midline shift is seen.  There is no evidence of fracture; visualized osseous structures are unremarkable in appearance. The orbits are within normal limits. The paranasal sinuses and mastoid air cells are well-aerated. No significant soft tissue abnormalities are seen.  IMPRESSION: Unremarkable noncontrast CT of the head.   Electronically Signed   By: Garald Balding M.D.   On: 02/27/2015 02:04     ASSESSMENT / PLAN:  PULMONARY No active issue  P:   Monitor resp status post brief arrest  F/u CXR   CARDIOVASCULAR Cardiac arrest - brief <1 min  Syncope  Torsades de pointe  P:  Stat 2D echo Stat Mg - given  Stat chem, mg, phos  Cards/EP to see  F/u troponin  F/u 12 lead   RENAL No active issue  P:   Stat electrolytes  F/u chem in am   GASTROINTESTINAL Mild elevated transaminases  Nausea  P:   F/u LFT's  NPO for now  D/c zofran    HEMATOLOGIC Leukocytosis - mild  P:  F/u CBC  lovenox   INFECTIOUS No active issue  P:   Monitor wbc, fever curve off abx   ENDOCRINE No active issue - TSH, glucose wnl  P:   Monitor   NEUROLOGIC Syncope  ?Seizure - doubt  P:   Monitor  Consider EEG - hold for now.  Suspect "seizure"/AMS r/t arrhythmia    FAMILY  - Updates:  Pt updated at length at bedside 8/3  - Inter-disciplinary family meet or Palliative Care meeting due by:  8/10     Nickolas Madrid, NP 02/27/2015  8:50 AM Pager: [phone] or [phone]

## 2015-02-27 NOTE — Progress Notes (Signed)
Patient had 12 bt run of vtach and a 6 bt run of vtach.  Patient asymptomatic, resting comfortably.  MD notified. No new orders received.  RN will continue to monitor patient

## 2015-02-27 NOTE — H&P (Signed)
[facility] History and Physical  Patient: Teresa Henry  MRN: [id number]  DOB: 1966-04-02  DOS: the patient was seen and examined on 02/27/2015 PCP: Garnet Koyanagi, DO  Referring physician: Dr. Doy Mince Chief Complaint: Passing out episode  HPI: Teresa Henry is a 49 y.o. female with Past medical history of migraine. The patient presented with complaints of passing out episode. The patient was initially seen in the ER in the afternoon of 02/26/2015. Patient mentions that she has been moving to a new house and was at a meeting with house builder. After the meeting she suddenly became lightheaded and felt jittery and then passed out. She did have some diaphoresis and also had some nausea and headache. When she woke up she was initially more confused. No loss of control of bowel or bladder. She denies any similar episode in the past. She was seen in the ER received IV fluids and was discharged back home and after at home when she was sleeping for husband noted that she was breathing very heavily later on started having slow breathing and may have stopped breathing. Her face became red and flushed and during this whole episode she was not responding to any verbal or physical stimuli. EMS was called and the patient was brought in to the hospital. She had 2 episodes of vomiting in the ER. At the time of my evaluation the patient denies having any complaints. Patient denies taking any medication patient denies any recent travel or recent surgeries and procedure. No fall no trauma no injury reported. Patient mentions she drinks a lot of fluids during the day.  The patient is coming from home.  At her baseline ambulates without any support And is independent for most of her ADL manages her medication on her own.  Review of Systems: as mentioned in the history of present illness.  A comprehensive review of the other systems is negative.  Past Medical History  Diagnosis Date  . Frequent  episodic tension-type headache    Past Surgical History  Procedure Laterality Date  . Breast biopsy  1999    Needle Biopsy- Non cancer   Social History:  reports that she has never smoked. She has never used smokeless tobacco. She reports that she does not drink alcohol or use illicit drugs.  No Known Allergies  Family History  Problem Relation Age of Onset  . Arthritis Mother   . Hypertension Mother   . Arthritis Father   . Hypertension Father   . Stroke Maternal Grandmother   . Stroke Paternal Grandmother   . Heart attack Brother 15    Prior to Admission medications   Medication Sig Start Date End Date Taking? Authorizing Provider  aspirin 81 MG tablet Take 81 mg by mouth daily.   Yes Historical Provider, MD    Physical Exam: Filed Vitals:   02/27/15 0302 02/27/15 0315 02/27/15 0341 02/27/15 0342  BP: 107/61 116/66 98/51 114/62  Pulse: 77 78 78 78  Temp:   98.4 F (36.9 C)   TempSrc:   Oral   Resp: 18 16    Height:   5\' 3"  (1.6 m)   Weight:   70.4 kg (155 lb 3.3 oz)   SpO2: 97% 97% 96% 97%    General: Alert, Awake and Oriented to Time, Place and Person. Appear in mild distress Eyes: PERRL ENT: Oral Mucosa clear moist. Neck: no JVD Cardiovascular: S1 and S2 Present, no Murmur, Peripheral Pulses Present Respiratory: Bilateral Air entry equal and Decreased,  Clear to Auscultation, no Crackles, no wheezes Abdomen: Bowel Sound present, Soft and non tender Skin: no Rash Extremities: no Pedal edema, no calf tenderness Neurologic: Grossly no focal neuro deficit.  Labs on Admission:  CBC:  Recent Labs Lab 02/26/15 1632 02/27/15 0415  WBC 9.9 11.9*  NEUTROABS  --  9.8*  HGB 14.7 14.1  HCT 43.2 41.2  MCV 83.4 83.1  PLT 235 263    CMP     Component Value Date/Time   NA 139 02/27/2015 0415   K 3.9 02/27/2015 0415   CL 107 02/27/2015 0415   CO2 23 02/27/2015 0415   GLUCOSE 116* 02/27/2015 0415   BUN 5* 02/27/2015 0415   CREATININE 0.79 02/27/2015 0415     CALCIUM 8.8* 02/27/2015 0415   PROT 5.9* 02/27/2015 0415   ALBUMIN 3.6 02/27/2015 0415   AST 83* 02/27/2015 0415   ALT 104* 02/27/2015 0415   ALKPHOS 77 02/27/2015 0415   BILITOT 0.5 02/27/2015 0415   GFRNONAA >60 02/27/2015 0415   GFRAA >60 02/27/2015 0415    No results for input(s): LIPASE, AMYLASE in the last 168 hours.  No results for input(s): CKTOTAL, CKMB, CKMBINDEX, TROPONINI in the last 168 hours. BNP (last 3 results) No results for input(s): BNP in the last 8760 hours.  ProBNP (last 3 results) No results for input(s): PROBNP in the last 8760 hours.   Radiological Exams on Admission: Dg Chest 2 View  02/27/2015   CLINICAL DATA:  Syncope twice today. Patient was in the emergency department earlier today following the first episode.  EXAM: CHEST  2 VIEW  COMPARISON:  None  FINDINGS: There is borderline cardiomegaly. The lungs are clear. There are no pleural effusions. Hilar, mediastinal and cardiac contours are unremarkable.  IMPRESSION: No active cardiopulmonary disease.   Electronically Signed   By: Andreas Newport M.D.   On: 02/27/2015 02:08   Ct Head Wo Contrast  02/27/2015   CLINICAL DATA:  Acute onset of syncope.  Initial encounter.  EXAM: CT HEAD WITHOUT CONTRAST  TECHNIQUE: Contiguous axial images were obtained from the base of the skull through the vertex without intravenous contrast.  COMPARISON:  None.  FINDINGS: There is no evidence of acute infarction, mass lesion, or intra- or extra-axial hemorrhage on CT.  The posterior fossa, including the cerebellum, brainstem and fourth ventricle, is within normal limits. The third and lateral ventricles, and basal ganglia are unremarkable in appearance. The cerebral hemispheres are symmetric in appearance, with normal gray-white differentiation. No mass effect or midline shift is seen.  There is no evidence of fracture; visualized osseous structures are unremarkable in appearance. The orbits are within normal limits. The  paranasal sinuses and mastoid air cells are well-aerated. No significant soft tissue abnormalities are seen.  IMPRESSION: Unremarkable noncontrast CT of the head.   Electronically Signed   By: Garald Balding M.D.   On: 02/27/2015 02:04   EKG: Independently reviewed. normal sinus rhythm, nonspecific ST and T waves changes.  Assessment/Plan Principal Problem:   Syncopal episodes   1. Syncopal episodes The patient is presenting with numbness of multiple syncopal episode. Her EKG is unremarkable CT scan is unremarkable chest x-ray is also unremarkable neurological examination is also unremarkable. She does not have any evidence of murmur on cardiac examination as well. Orthostatic vitals 2 are negative. With this it is unclear why the patient is having recurrent episodes. Possible cardiac arrhythmia cannot be ruled out and therefore the patient will be admitted in the hospital. [facility]  monitor her on telemetry. Echocardiogram in the morning. Depending on the workup cardiology will be consulted. Gentle IV hydration will also be provided.  Advance goals of care discussion: Full code   DVT Prophylaxis: subcutaneous Heparin Nutrition: Regular diet  Family Communication: family was present at bedside, opportunity was given to ask question and all questions were answered satisfactorily at the time of interview. Disposition: Admitted as observation,Telemetry unit.  Author: Berle Mull, MD [facility] Pager: [phone] 02/27/2015  If 7PM-7AM, please contact night-coverage [email or web address] Password [email or web address]

## 2015-02-27 NOTE — ED Notes (Signed)
Pt arrives from home via EMS. Pt  Had a syncopal episode tonight after being released from hospital this evening for similar complaint. Pt A/Ox4. Per EMS pt had a 10pt drop in orthostatic BP reading. EMS gave 4mg  of Zofran for N/V. Pt vomited x 2.

## 2015-02-28 ENCOUNTER — Encounter (HOSPITAL_COMMUNITY): Payer: Self-pay | Admitting: Interventional Cardiology

## 2015-02-28 ENCOUNTER — Encounter (HOSPITAL_COMMUNITY)
Admission: EM | Disposition: A | Discharge status: Home / Self Care | Payer: Self-pay | Source: Home / Self Care | Attending: Pulmonary Disease

## 2015-02-28 ENCOUNTER — Encounter (HOSPITAL_COMMUNITY)
Admission: EM | Disposition: A | Discharge status: Home / Self Care | Payer: BLUE CROSS/BLUE SHIELD | Source: Home / Self Care | Attending: Pulmonary Disease

## 2015-02-28 DIAGNOSIS — I4721 Torsades de pointes: Secondary | ICD-10-CM

## 2015-02-28 DIAGNOSIS — I472 Ventricular tachycardia: Secondary | ICD-10-CM

## 2015-02-28 DIAGNOSIS — R55 Syncope and collapse: Secondary | ICD-10-CM

## 2015-02-28 HISTORY — PX: EP IMPLANTABLE DEVICE: SHX172B

## 2015-02-28 HISTORY — PX: CARDIAC CATHETERIZATION: SHX172

## 2015-02-28 LAB — BASIC METABOLIC PANEL
ANION GAP: 6 (ref 5–15)
BUN: 7 mg/dL (ref 6–20)
CHLORIDE: 109 mmol/L (ref 101–111)
CO2: 23 mmol/L (ref 22–32)
Calcium: 8.4 mg/dL — ABNORMAL LOW (ref 8.9–10.3)
Creatinine, Ser: 0.73 mg/dL (ref 0.44–1.00)
GLUCOSE: 99 mg/dL (ref 65–99)
Potassium: 3.5 mmol/L (ref 3.5–5.1)
Sodium: 138 mmol/L (ref 135–145)

## 2015-02-28 LAB — PROTIME-INR
INR: 1.05 (ref 0.00–1.49)
Prothrombin Time: 13.9 seconds (ref 11.6–15.2)

## 2015-02-28 LAB — PREGNANCY, URINE: Preg Test, Ur: NEGATIVE

## 2015-02-28 LAB — MAGNESIUM: Magnesium: 2.3 mg/dL (ref 1.7–2.4)

## 2015-02-28 LAB — PHOSPHORUS: PHOSPHORUS: 3 mg/dL (ref 2.5–4.6)

## 2015-02-28 LAB — GLUCOSE, CAPILLARY: Glucose-Capillary: 101 mg/dL — ABNORMAL HIGH (ref 65–99)

## 2015-02-28 SURGERY — ICD IMPLANT
Anesthesia: LOCAL

## 2015-02-28 SURGERY — LEFT HEART CATH AND CORONARY ANGIOGRAPHY

## 2015-02-28 MED ORDER — LIDOCAINE HCL (PF) 1 % IJ SOLN
INTRAMUSCULAR | Status: AC
  Filled 2015-02-28: qty 60

## 2015-02-28 MED ORDER — SODIUM CHLORIDE 0.9 % IV SOLN
250.0000 mL | INTRAVENOUS | Status: DC | PRN
Start: 2015-02-28 — End: 2015-02-28

## 2015-02-28 MED ORDER — SODIUM CHLORIDE 0.9 % IR SOLN
Status: AC
  Filled 2015-02-28: qty 2

## 2015-02-28 MED ORDER — SODIUM CHLORIDE 0.9 % IR SOLN
80.0000 mg | Status: DC
  Filled 2015-02-28: qty 2

## 2015-02-28 MED ORDER — FENTANYL CITRATE (PF) 100 MCG/2ML IJ SOLN
INTRAMUSCULAR | Status: DC | PRN
  Administered 2015-02-28: 50 ug via INTRAVENOUS

## 2015-02-28 MED ORDER — MIDAZOLAM HCL 5 MG/5ML IJ SOLN
INTRAMUSCULAR | Status: AC
  Filled 2015-02-28: qty 25

## 2015-02-28 MED ORDER — CHLORHEXIDINE GLUCONATE 4 % EX LIQD: 60.0000 mL | Freq: Once | CUTANEOUS | Status: DC

## 2015-02-28 MED ORDER — MIDAZOLAM HCL 5 MG/5ML IJ SOLN
INTRAMUSCULAR | Status: DC | PRN
  Administered 2015-02-28 (×3): 1 mg via INTRAVENOUS
  Administered 2015-02-28: 2 mg via INTRAVENOUS
  Administered 2015-02-28 (×2): 1 mg via INTRAVENOUS

## 2015-02-28 MED ORDER — SODIUM CHLORIDE 0.9 % IJ SOLN: 3.0000 mL | INTRAMUSCULAR | Status: DC | PRN

## 2015-02-28 MED ORDER — NITROGLYCERIN 1 MG/10 ML FOR IR/CATH LAB
INTRA_ARTERIAL | Status: AC
  Filled 2015-02-28: qty 10

## 2015-02-28 MED ORDER — POTASSIUM CHLORIDE CRYS ER 20 MEQ PO TBCR
20.0000 meq | EXTENDED_RELEASE_TABLET | Freq: Once | ORAL | Status: AC
  Administered 2015-02-28: 20 meq via ORAL
  Filled 2015-02-28: qty 1

## 2015-02-28 MED ORDER — FENTANYL CITRATE (PF) 100 MCG/2ML IJ SOLN
INTRAMUSCULAR | Status: DC | PRN
  Administered 2015-02-28: 12.5 ug via INTRAVENOUS
  Administered 2015-02-28 (×2): 25 ug via INTRAVENOUS
  Administered 2015-02-28: 12.5 ug via INTRAVENOUS

## 2015-02-28 MED ORDER — VERAPAMIL HCL 2.5 MG/ML IV SOLN
INTRAVENOUS | Status: DC | PRN
  Administered 2015-02-28: 10:00:00 via INTRA_ARTERIAL

## 2015-02-28 MED ORDER — ACETAMINOPHEN 325 MG PO TABS
650.0000 mg | ORAL_TABLET | ORAL | Status: DC | PRN
  Administered 2015-02-28 – 2015-03-01 (×2): 650 mg via ORAL
  Filled 2015-02-28 (×2): qty 2

## 2015-02-28 MED ORDER — SODIUM CHLORIDE 0.9 % IR SOLN
Status: DC | PRN
  Administered 2015-02-28: 12:00:00

## 2015-02-28 MED ORDER — SODIUM CHLORIDE 0.9 % WEIGHT BASED INFUSION: 3.0000 mL/kg/h | INTRAVENOUS | Status: AC

## 2015-02-28 MED ORDER — SODIUM CHLORIDE 0.9 % IV SOLN: INTRAVENOUS | Status: DC

## 2015-02-28 MED ORDER — IOHEXOL 350 MG/ML SOLN
INTRAVENOUS | Status: DC | PRN
  Administered 2015-02-28: 15 mL via INTRAVENOUS

## 2015-02-28 MED ORDER — HYDROCODONE-ACETAMINOPHEN 5-325 MG PO TABS: 1.0000 | ORAL_TABLET | ORAL | Status: DC | PRN

## 2015-02-28 MED ORDER — ONDANSETRON HCL 4 MG/2ML IJ SOLN
4.0000 mg | Freq: Four times a day (QID) | INTRAMUSCULAR | Status: DC | PRN
  Administered 2015-02-28: 4 mg via INTRAVENOUS
  Filled 2015-02-28: qty 2

## 2015-02-28 MED ORDER — MIDAZOLAM HCL 2 MG/2ML IJ SOLN
INTRAMUSCULAR | Status: DC | PRN
  Administered 2015-02-28: 1 mg via INTRAVENOUS

## 2015-02-28 MED ORDER — CEFAZOLIN SODIUM-DEXTROSE 2-3 GM-% IV SOLR
INTRAVENOUS | Status: DC | PRN
  Administered 2015-02-28: 2 g via INTRAVENOUS

## 2015-02-28 MED ORDER — HEPARIN SODIUM (PORCINE) 1000 UNIT/ML IJ SOLN
INTRAMUSCULAR | Status: DC | PRN
  Administered 2015-02-28: 3500 [IU] via INTRAVENOUS

## 2015-02-28 MED ORDER — IOHEXOL 350 MG/ML SOLN
INTRAVENOUS | Status: DC | PRN
  Administered 2015-02-28: 75 mL via INTRA_ARTERIAL

## 2015-02-28 MED ORDER — LIDOCAINE HCL (PF) 1 % IJ SOLN
INTRAMUSCULAR | Status: DC | PRN
  Administered 2015-02-28: 35 mL via INTRADERMAL

## 2015-02-28 MED ORDER — FENTANYL CITRATE (PF) 100 MCG/2ML IJ SOLN
INTRAMUSCULAR | Status: AC
  Filled 2015-02-28: qty 4

## 2015-02-28 MED ORDER — SODIUM CHLORIDE 0.9 % IJ SOLN: 3.0000 mL | Freq: Two times a day (BID) | INTRAMUSCULAR | Status: DC

## 2015-02-28 MED ORDER — CEFAZOLIN SODIUM-DEXTROSE 2-3 GM-% IV SOLR: 2.0000 g | INTRAVENOUS | Status: DC

## 2015-02-28 MED ORDER — HEPARIN (PORCINE) IN NACL 2-0.9 UNIT/ML-% IJ SOLN
INTRAMUSCULAR | Status: AC
  Filled 2015-02-28: qty 1500

## 2015-02-28 MED ORDER — SODIUM CHLORIDE 0.9 % IV SOLN: 250.0000 mL | INTRAVENOUS | Status: DC | PRN

## 2015-02-28 MED ORDER — CEFAZOLIN SODIUM-DEXTROSE 2-3 GM-% IV SOLR
INTRAVENOUS | Status: AC
  Filled 2015-02-28: qty 50

## 2015-02-28 MED ORDER — POTASSIUM CHLORIDE CRYS ER 20 MEQ PO TBCR
20.0000 meq | EXTENDED_RELEASE_TABLET | ORAL | Status: AC
  Administered 2015-02-28: 20 meq via ORAL
  Filled 2015-02-28 (×2): qty 1

## 2015-02-28 MED ORDER — SODIUM CHLORIDE 0.9 % IJ SOLN
3.0000 mL | Freq: Two times a day (BID) | INTRAMUSCULAR | Status: DC
  Administered 2015-02-28: 3 mL via INTRAVENOUS

## 2015-02-28 MED ORDER — CEFAZOLIN SODIUM 1-5 GM-% IV SOLN
1.0000 g | Freq: Four times a day (QID) | INTRAVENOUS | Status: AC
  Administered 2015-02-28 – 2015-03-01 (×3): 1 g via INTRAVENOUS
  Filled 2015-02-28 (×3): qty 50

## 2015-02-28 MED ORDER — MIDAZOLAM HCL 2 MG/2ML IJ SOLN
INTRAMUSCULAR | Status: AC
  Filled 2015-02-28: qty 4

## 2015-02-28 MED ORDER — NITROGLYCERIN 1 MG/10 ML FOR IR/CATH LAB
INTRA_ARTERIAL | Status: DC | PRN
  Administered 2015-02-28: 11:00:00

## 2015-02-28 MED ORDER — LIDOCAINE HCL (PF) 1 % IJ SOLN
INTRAMUSCULAR | Status: AC
  Filled 2015-02-28: qty 30

## 2015-02-28 SURGICAL SUPPLY — 8 items
CABLE SURGICAL S-101-97-12 (CABLE) ×1 IMPLANT
ICD EVERA XT DR DDBB1D4 (ICD Generator) ×1 IMPLANT
LEAD CAPSURE NOVUS 45CM (Lead) ×1 IMPLANT
LEAD SPRINT QUAT SEC 6935M-62 (Lead) ×1 IMPLANT
PAD DEFIB LIFELINK (PAD) ×1 IMPLANT
SHEATH CLASSIC 7F (SHEATH) ×1 IMPLANT
SHEATH CLASSIC 9F (SHEATH) ×1 IMPLANT
TRAY PACEMAKER INSERTION (CUSTOM PROCEDURE TRAY) ×1 IMPLANT

## 2015-02-28 SURGICAL SUPPLY — 11 items
CATH INFINITI 5 FR JL3.5 (CATHETERS) ×3 IMPLANT
CATH INFINITI JR4 5F (CATHETERS) ×3 IMPLANT
CATH OPTITORQUE TIG 4.0 5F (CATHETERS) ×2 IMPLANT
DEVICE RAD COMP TR BAND LRG (VASCULAR PRODUCTS) ×3 IMPLANT
GLIDESHEATH SLEND A-KIT 6F 22G (SHEATH) ×3 IMPLANT
KIT HEART LEFT (KITS) ×3 IMPLANT
PACK CARDIAC CATHETERIZATION (CUSTOM PROCEDURE TRAY) ×3 IMPLANT
TRANSDUCER W/STOPCOCK (MISCELLANEOUS) ×3 IMPLANT
TUBING CIL FLEX 10 FLL-RA (TUBING) ×3 IMPLANT
WIRE HI TORQ VERSACORE-J 145CM (WIRE) ×2 IMPLANT
WIRE SAFE-T 1.5MM-J .035X260CM (WIRE) ×3 IMPLANT

## 2015-02-28 NOTE — Progress Notes (Signed)
PULMONARY / CRITICAL CARE MEDICINE   Name: Teresa Henry MRN: [id number] DOB: 06-Dec-1965    ADMISSION DATE:  02/27/2015 CONSULTATION DATE:  Caroleen Hamman  REFERRING MD :  Ghimire ([facility])   CHIEF COMPLAINT:  Cardiac arrest -torsades   INITIAL PRESENTATION: 49yo female with only PMH migraines presented 8/3 with 2 episodes syncope.  She was admitted by [facility] and on Dr. Nena Alexander morning exam she was awake and alert, discussing plan of care when she became acutely unresponsive then cyanotic.  Code blue called, approx 20 secs CPR and 1 shock for what was thought to be VFib with quick ROSC and back to NSR.  On further inspection EKG c/w torsades.  PCCM consulted for tx ICU.   STUDIES:  2D echo 8/3>>> EF 65%, moderate hypertrophy, normal PA pressure, grade 1 diastolic dysfunction  CT head 8/3>>> neg acute   SIGNIFICANT EVENTS:   SUBJECTIVE:  Feels groggy post cath.  Denies chest pain, lightheadedness    VITAL SIGNS: Temp:  [98.1 F (36.7 C)-99.1 F (37.3 C)] 98.8 F (37.1 C) (08/04 0906) Pulse Rate:  [50-92] 80 (08/04 1425) Resp:  [0-23] 21 (08/04 1425) BP: (88-141)/(50-94) 131/83 mmHg (08/04 1425) SpO2:  [0 %-100 %] 93 % (08/04 1425) Weight:  [153 lb (69.4 kg)] 153 lb (69.4 kg) (08/04 0500)    INTAKE / OUTPUT:  Intake/Output Summary (Last 24 hours) at 02/28/15 1442 Last data filed at 02/28/15 0900  Gross per 24 hour  Intake   1102 ml  Output   1600 ml  Net   -498 ml    PHYSICAL EXAMINATION: General:  wdwn female, NAD in bed  Neuro:  Sleepy but appropriate, anxious  HEENT:  Mm moist, NRB, no JVD  Cardiovascular:  s1s2 rrr, no m/r/g, L icd site c/d  Lungs:  resps even non labored on [place] cta  Abdomen:  Round, soft, non tender, +bs  Musculoskeletal:  Warm and dry, no edema, L arm sling   LABS:  CBC  Recent Labs Lab 02/26/15 1632 02/27/15 0415  WBC 9.9 11.9*  HGB 14.7 14.1  HCT 43.2 41.2  PLT 235 263   Coag's  Recent Labs Lab 02/28/15 0616  INR 1.05    BMET  Recent Labs Lab 02/27/15 0415 02/27/15 0845 02/28/15 0222  NA 139 139 138  K 3.9 3.6 3.5  CL 107 107 109  CO2 23 21* 23  BUN 5* 6 7  CREATININE 0.79 0.80 0.73  GLUCOSE 116* 139* 99   Electrolytes  Recent Labs Lab 02/27/15 0415 02/27/15 0845 02/28/15 0222  CALCIUM 8.8* 8.8* 8.4*  MG 1.9  --  2.3  PHOS  --  2.9 3.0   Liver Enzymes  Recent Labs Lab 02/27/15 0415  AST 83*  ALT 104*  ALKPHOS 77  BILITOT 0.5  ALBUMIN 3.6   Cardiac Enzymes  Recent Labs Lab 02/27/15 0845 02/27/15 1525 02/27/15 2107  TROPONINI 0.07* 0.09* 0.07*   Glucose  Recent Labs Lab 02/26/15 1630 02/27/15 0740 02/27/15 0822 02/27/15 0905 02/28/15 0820  GLUCAP 112* 104* 93 119* 101*    Imaging No results found.   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   PCCM signing off please call back if needed.  FAMILY  - Updates:  Pt and husband updated 8/4   Nickolas Madrid, NP 02/28/2015  2:42 PM Pager: [phone] or [phone]

## 2015-02-28 NOTE — H&P (View-Only) (Signed)
SUBJECTIVE: The patient is doing well today.  At this time, she denies chest pain, shortness of breath, or any new concerns.  CURRENT MEDICATIONS: . aspirin  81 mg Oral Daily  . nadolol  40 mg Oral BID  . potassium chloride  20 mEq Oral Q4H  . sodium chloride  3 mL Intravenous Q12H  . sodium chloride  3 mL Intravenous Q12H   . sodium chloride 50 mL/hr at 02/27/15 1034  . sodium chloride 1 mL/kg/hr (02/28/15 0700)    OBJECTIVE: Physical Exam: Filed Vitals:   02/28/15 0600 02/28/15 0630 02/28/15 0700 02/28/15 0800  BP: 110/66 118/78 122/94 126/75  Pulse: 56 55 59 57  Temp:      TempSrc:      Resp: 20 22 12 23   Height:      Weight:      SpO2: 97% 96% 98% 97%    Intake/Output Summary (Last 24 hours) at 02/28/15 0848 Last data filed at 02/28/15 0800  Gross per 24 hour  Intake 1203.27 ml  Output   1950 ml  Net -746.73 ml    Telemetry reveals sinus rhythm with PVC's  GEN- The patient is well appearing, alert and oriented x 3 today.   Head- normocephalic, atraumatic Eyes-  Sclera clear, conjunctiva pink Ears- hearing intact Oropharynx- clear Neck- supple  Lungs- Clear to ausculation bilaterally, normal work of breathing Heart- Regular rate and rhythm GI- soft, NT, ND, + BS Extremities- no clubbing, cyanosis, or edema Skin- no rash or lesion Psych- euthymic mood, full affect Neuro- strength and sensation are intact  LABS: Basic Metabolic Panel:  Recent Labs  02/27/15 0415 02/27/15 0845 02/28/15 0222  NA 139 139 138  K 3.9 3.6 3.5  CL 107 107 109  CO2 23 21* 23  GLUCOSE 116* 139* 99  BUN 5* 6 7  CREATININE 0.79 0.80 0.73  CALCIUM 8.8* 8.8* 8.4*  MG 1.9  --  2.3  PHOS  --  2.9 3.0   Liver Function Tests:  Recent Labs  02/27/15 0415  AST 83*  ALT 104*  ALKPHOS 77  BILITOT 0.5  PROT 5.9*  ALBUMIN 3.6    Recent Labs  02/27/15 0845  LIPASE 27  AMYLASE 19*   CBC:  Recent Labs  02/26/15 1632 02/27/15 0415  WBC 9.9 11.9*  NEUTROABS   --  9.8*  HGB 14.7 14.1  HCT 43.2 41.2  MCV 83.4 83.1  PLT 235 263   Cardiac Enzymes:  Recent Labs  02/27/15 0845 02/27/15 1525 02/27/15 2107  TROPONINI 0.07* 0.09* 0.07*   Thyroid Function Tests:  Recent Labs  02/27/15 0415  TSH 0.488    RADIOLOGY: Dg Chest 2 View 02/27/2015   CLINICAL DATA:  Syncope twice today. Patient was in the emergency department earlier today following the first episode.  EXAM: CHEST  2 VIEW  COMPARISON:  None  FINDINGS: There is borderline cardiomegaly. The lungs are clear. There are no pleural effusions. Hilar, mediastinal and cardiac contours are unremarkable.  IMPRESSION: No active cardiopulmonary disease.   Electronically Signed   By: Andreas Newport M.D.   On: 02/27/2015 02:08   Ct Head Wo Contrast 02/27/2015   CLINICAL DATA:  Acute onset of syncope.  Initial encounter.  EXAM: CT HEAD WITHOUT CONTRAST  TECHNIQUE: Contiguous axial images were obtained from the base of the skull through the vertex without intravenous contrast.  COMPARISON:  None.  FINDINGS: There is no evidence of acute infarction, mass lesion, or intra- or  extra-axial hemorrhage on CT.  The posterior fossa, including the cerebellum, brainstem and fourth ventricle, is within normal limits. The third and lateral ventricles, and basal ganglia are unremarkable in appearance. The cerebral hemispheres are symmetric in appearance, with normal gray-white differentiation. No mass effect or midline shift is seen.  There is no evidence of fracture; visualized osseous structures are unremarkable in appearance. The orbits are within normal limits. The paranasal sinuses and mastoid air cells are well-aerated. No significant soft tissue abnormalities are seen.  IMPRESSION: Unremarkable noncontrast CT of the head.   Electronically Signed   By: Garald Balding M.D.   On: 02/27/2015 02:04    ASSESSMENT AND PLAN:  Principal Problem:   Syncopal episodes Active Problems:   Torsades de pointes    1.   Torsades/aborted cardiac arrest The patient has had recurrent episodes of Torsades that required defibrillation.  Electrolytes normal.  She has been started on Nadolol with some improvement Keep K >3.9, Mg >1.8 For catheterization this morning.  If no revascularizable CAD, will plan ICD implantation. Risks, benefits discussed with the patient by Dr Rayann Heman.  No driving x6 months (pt aware) She has relative bradycardia, with need for increased BB therapy, will likely need dual chamber ICD.  2.  Hypokalemia Repleted this morning Repeat BMET, Mg tomorrow  3.  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  Episodes appear to be long short coupled and PVC initiated.   At this time, she meets criteria for ICD implantation for secondary prevention of sudden death.  Risks, benefits, alternatives to ICD implantation were discussed in detail with the patient today. The patient  understands that the risks include but are not limited to bleeding, infection, pneumothorax, perforation, tamponade, vascular damage, renal failure, MI, stroke, death, inappropriate shocks, and lead dislodgement and wishes to proceed if her cath is unrevealing of any other causes for her arrest.    Thompson Grayer MD, [facility] 02/28/2015 10:04 AM

## 2015-02-28 NOTE — Interval H&P Note (Signed)
Cath Lab Visit (complete for each Cath Lab visit)  Clinical Evaluation Leading to the Procedure:   ACS: No.  Non-ACS:    Anginal Classification: No Symptoms  Anti-ischemic medical therapy: No Therapy  Non-Invasive Test Results: No non-invasive testing performed  Prior CABG: No previous CABG      History and Physical Interval Note:  02/28/2015 8:10 AM  Teresa Henry  has presented today for surgery, with the diagnosis of snycope  The various methods of treatment have been discussed with the patient and family. After consideration of risks, benefits and other options for treatment, the patient has consented to  Procedure(s): Left Heart Cath and Coronary Angiography (N/A) as a surgical intervention .  The patient's history has been reviewed, patient examined, no change in status, stable for surgery.  I have reviewed the patient's chart and labs.  Questions were answered to the patient's satisfaction.     Sinclair Grooms

## 2015-02-28 NOTE — Progress Notes (Signed)
SUBJECTIVE: The patient is doing well today.  At this time, she denies chest pain, shortness of breath, or any new concerns.  CURRENT MEDICATIONS: . aspirin  81 mg Oral Daily  . nadolol  40 mg Oral BID  . potassium chloride  20 mEq Oral Q4H  . sodium chloride  3 mL Intravenous Q12H  . sodium chloride  3 mL Intravenous Q12H   . sodium chloride 50 mL/hr at 02/27/15 1034  . sodium chloride 1 mL/kg/hr (02/28/15 0700)    OBJECTIVE: Physical Exam: Filed Vitals:   02/28/15 0600 02/28/15 0630 02/28/15 0700 02/28/15 0800  BP: 110/66 118/78 122/94 126/75  Pulse: 56 55 59 57  Temp:      TempSrc:      Resp: 20 22 12 23   Height:      Weight:      SpO2: 97% 96% 98% 97%    Intake/Output Summary (Last 24 hours) at 02/28/15 0848 Last data filed at 02/28/15 0800  Gross per 24 hour  Intake 1203.27 ml  Output   1950 ml  Net -746.73 ml    Telemetry reveals sinus rhythm with PVC's  GEN- The patient is well appearing, alert and oriented x 3 today.   Head- normocephalic, atraumatic Eyes-  Sclera clear, conjunctiva pink Ears- hearing intact Oropharynx- clear Neck- supple  Lungs- Clear to ausculation bilaterally, normal work of breathing Heart- Regular rate and rhythm GI- soft, NT, ND, + BS Extremities- no clubbing, cyanosis, or edema Skin- no rash or lesion Psych- euthymic mood, full affect Neuro- strength and sensation are intact  LABS: Basic Metabolic Panel:  Recent Labs  02/27/15 0415 02/27/15 0845 02/28/15 0222  NA 139 139 138  K 3.9 3.6 3.5  CL 107 107 109  CO2 23 21* 23  GLUCOSE 116* 139* 99  BUN 5* 6 7  CREATININE 0.79 0.80 0.73  CALCIUM 8.8* 8.8* 8.4*  MG 1.9  --  2.3  PHOS  --  2.9 3.0   Liver Function Tests:  Recent Labs  02/27/15 0415  AST 83*  ALT 104*  ALKPHOS 77  BILITOT 0.5  PROT 5.9*  ALBUMIN 3.6    Recent Labs  02/27/15 0845  LIPASE 27  AMYLASE 19*   CBC:  Recent Labs  02/26/15 1632 02/27/15 0415  WBC 9.9 11.9*  NEUTROABS   --  9.8*  HGB 14.7 14.1  HCT 43.2 41.2  MCV 83.4 83.1  PLT 235 263   Cardiac Enzymes:  Recent Labs  02/27/15 0845 02/27/15 1525 02/27/15 2107  TROPONINI 0.07* 0.09* 0.07*   Thyroid Function Tests:  Recent Labs  02/27/15 0415  TSH 0.488    RADIOLOGY: Dg Chest 2 View 02/27/2015   CLINICAL DATA:  Syncope twice today. Patient was in the emergency department earlier today following the first episode.  EXAM: CHEST  2 VIEW  COMPARISON:  None  FINDINGS: There is borderline cardiomegaly. The lungs are clear. There are no pleural effusions. Hilar, mediastinal and cardiac contours are unremarkable.  IMPRESSION: No active cardiopulmonary disease.   Electronically Signed   By: Andreas Newport M.D.   On: 02/27/2015 02:08   Ct Head Wo Contrast 02/27/2015   CLINICAL DATA:  Acute onset of syncope.  Initial encounter.  EXAM: CT HEAD WITHOUT CONTRAST  TECHNIQUE: Contiguous axial images were obtained from the base of the skull through the vertex without intravenous contrast.  COMPARISON:  None.  FINDINGS: There is no evidence of acute infarction, mass lesion, or intra- or  extra-axial hemorrhage on CT.  The posterior fossa, including the cerebellum, brainstem and fourth ventricle, is within normal limits. The third and lateral ventricles, and basal ganglia are unremarkable in appearance. The cerebral hemispheres are symmetric in appearance, with normal gray-white differentiation. No mass effect or midline shift is seen.  There is no evidence of fracture; visualized osseous structures are unremarkable in appearance. The orbits are within normal limits. The paranasal sinuses and mastoid air cells are well-aerated. No significant soft tissue abnormalities are seen.  IMPRESSION: Unremarkable noncontrast CT of the head.   Electronically Signed   By: Garald Balding M.D.   On: 02/27/2015 02:04    ASSESSMENT AND PLAN:  Principal Problem:   Syncopal episodes Active Problems:   Torsades de pointes    1.   Torsades/aborted cardiac arrest The patient has had recurrent episodes of Torsades that required defibrillation.  Electrolytes normal.  She has been started on Nadolol with some improvement Keep K >3.9, Mg >1.8 For catheterization this morning.  If no revascularizable CAD, will plan ICD implantation. Risks, benefits discussed with the patient by Dr Rayann Heman.  No driving x6 months (pt aware) She has relative bradycardia, with need for increased BB therapy, will likely need dual chamber ICD.  2.  Hypokalemia Repleted this morning Repeat BMET, Mg tomorrow  3.  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  Episodes appear to be long short coupled and PVC initiated.   At this time, she meets criteria for ICD implantation for secondary prevention of sudden death.  Risks, benefits, alternatives to ICD implantation were discussed in detail with the patient today. The patient  understands that the risks include but are not limited to bleeding, infection, pneumothorax, perforation, tamponade, vascular damage, renal failure, MI, stroke, death, inappropriate shocks, and lead dislodgement and wishes to proceed if her cath is unrevealing of any other causes for her arrest.    Thompson Grayer MD, [facility] 02/28/2015 10:04 AM

## 2015-02-28 NOTE — Progress Notes (Signed)
Patient is awake and alert. Vitals stable Bp 126/96 HR 57 RR 18. Patient has been NPO. Consents signed and in patient chart. E-link notified. Husband at bedside.

## 2015-02-28 NOTE — Progress Notes (Signed)
[facility] ADULT ICU REPLACEMENT PROTOCOL FOR AM LAB REPLACEMENT ONLY  The patient does apply for the [facility] Adult ICU Electrolyte Replacment Protocol based on the criteria listed below:   1. Is GFR >/= 40 ml/min? Yes.    Patient's GFR today is >60 2. Is urine output >/= 0.5 ml/kg/hr for the last 6 hours? Yes.   Patient's UOP is 0.82 ml/kg/hr 3. Is BUN < 60 mg/dL? Yes.    Patient's BUN today is 7 4. Abnormal electrolyte(s): Potassium 5. Ordered repletion with: Potassium per protocol    Brelyn Woehl P 02/28/2015 4:21 AM

## 2015-02-28 NOTE — Interval H&P Note (Signed)
History and Physical Interval Note:  02/28/2015 10:37 AM    ICD Criteria  Current LVEF:60%. Within 12 months prior to implant: Yes   Heart failure history: No  Cardiomyopathy history: No.  Atrial Fibrillation/Atrial Flutter: No.  Ventricular tachycardia history: Yes, Hemodynamic instability present. VT Type: Sustained Ventricular Tachycardia - Polymorphic.  Cardiac arrest history: Yes, Ventricular Fibrillation.  History of syndromes with risk of sudden death: Yes, Long QT Syndrome  Previous ICD: No.  Current ICD indication: Secondary  PPM indication: No.  Beta Blocker therapy for 3 or more months: Yes, prescribed.   Ace Inhibitor/ARB therapy for 3 or more months: No, medical reason. not indicated given preserved EF     Teresa Henry  has presented today for surgery, with the diagnosis of VT  The various methods of treatment have been discussed with the patient and family. After consideration of risks, benefits and other options for treatment, the patient has consented to  Procedure(s): ICD Implant (N/A) as a surgical intervention .  The patient's history has been reviewed, patient examined, no change in status, stable for surgery.  I have reviewed the patient's chart and labs.  Questions were answered to the patient's satisfaction.     Thompson Grayer

## 2015-02-28 NOTE — H&P (View-Only) (Signed)
Reason for Consult:syncope with documented PMVT/VF/TDP  Referring Physician: Dr. Elicia Lamp is an 48 y.o. female.   HPI: The patient is a 49 yo woman who has been healthy. She notes 2 syncopal episodes in college for which she did not seek medical attention. One occurred in Calculus class and the other while she was in her dorm. She has passed out rarely in the past. She had an episode of syncope yesterday while meeting with "the granite guy" and spontaneously recovered. She did not experience palpitations. She suddenly became dizzy. On awakening she was weak but no loss of bowel or bladder incontinence and no tongue biting. No chest pressure or sob. She went to the ER and was hydrated and sent home. Her ECG was not particularly concerning. She was sleeping last night when her huband noted that she was not breathing appropriately and was seen to have seizure like activity. She remembers awakening when her husband was talking to the paramedics. She was admitted and overnight noted to have lots of PVC's/NSVT. This morning she passed out again and was noted to be in PMVT/VF. CPR was begun and after approx. 30 seconds she had spontaneous return of her circulation and rhythm. She has undergone 2D echo which demonstrated normal LV function. No wall motion abnormalities. Her ECG's demonstrate NSR with prolonged QT but not tremendously so. I reviewed her rhythm strips which are of poor quality and difficult because of artifact but her QT demonstrated what looked like very prolonged QT interval. PVC's preceeded her event. There is no family history of sudden death. Her brother has CAD.   PMH: Past Medical History  Diagnosis Date  . Frequent episodic tension-type headache     PSHX: Past Surgical History  Procedure Laterality Date  . Breast biopsy  1999    Needle Biopsy- Non cancer    FAMHX: Family History  Problem Relation Age of Onset  . Arthritis Mother   . Hypertension Mother   .  Arthritis Father   . Hypertension Father   . Stroke Maternal Grandmother   . Stroke Paternal Grandmother   . Heart attack Brother 30  no sudden death  Social History:  reports that she has never smoked. She has never used smokeless tobacco. She reports that she does not drink alcohol or use illicit drugs.  Allergies: No Known Allergies  Medications: I have reviewed the patient's current medications. There are no QT prolonging drugs or supplements.  Dg Chest 2 View  02/27/2015   CLINICAL DATA:  Syncope twice today. Patient was in the emergency department earlier today following the first episode.  EXAM: CHEST  2 VIEW  COMPARISON:  None  FINDINGS: There is borderline cardiomegaly. The lungs are clear. There are no pleural effusions. Hilar, mediastinal and cardiac contours are unremarkable.  IMPRESSION: No active cardiopulmonary disease.   Electronically Signed   By: Andreas Newport M.D.   On: 02/27/2015 02:08   Ct Head Wo Contrast  02/27/2015   CLINICAL DATA:  Acute onset of syncope.  Initial encounter.  EXAM: CT HEAD WITHOUT CONTRAST  TECHNIQUE: Contiguous axial images were obtained from the base of the skull through the vertex without intravenous contrast.  COMPARISON:  None.  FINDINGS: There is no evidence of acute infarction, mass lesion, or intra- or extra-axial hemorrhage on CT.  The posterior fossa, including the cerebellum, brainstem and fourth ventricle, is within normal limits. The third and lateral ventricles, and basal ganglia are unremarkable in appearance. The cerebral  hemispheres are symmetric in appearance, with normal gray-white differentiation. No mass effect or midline shift is seen.  There is no evidence of fracture; visualized osseous structures are unremarkable in appearance. The orbits are within normal limits. The paranasal sinuses and mastoid air cells are well-aerated. No significant soft tissue abnormalities are seen.  IMPRESSION: Unremarkable noncontrast CT of the head.    Electronically Signed   By: Garald Balding M.D.   On: 02/27/2015 02:04    ROS  As stated in the HPI and negative for all other systems.  Physical Exam  Vitals:Blood pressure 130/71, pulse 72, temperature 99.1 F (37.3 C), temperature source Oral, resp. rate 19, height 5\' 3"  (1.6 m), weight 155 lb 3.3 oz (70.4 kg), last menstrual period 02/25/2015, SpO2 100 %.  Well appearing middle aged woman, NAD HEENT: Unremarkable Neck:  No JVD, no thyromegally Back:  No CVA tenderness Lungs:  Clear with no wheezes HEART:  Regular rate rhythm, no murmurs, no rubs, no clicks Abd:  Flat, positive bowel sounds, no organomegally, no rebound, no guarding Ext:  2 plus pulses, no edema, no cyanosis, no clubbing Skin:  No rashes no nodules Neuro:  CN II through XII intact, motor grossly intact  ECG - NSR with QTC 500  Assessment/Plan: 1. Recurrent syncope/aborted sudden death 2. PMVT/VF/tdP 3. Prolonged QT interval Rec: With her family history of CAD, cardiac catheterization is reasonable. Would continue beta blocker although will switch to Nadolol. Will observe on Tele. She will likely need an ICD as she has really had aborted sudden death. I am puzzled by the transient nature of her QT prolongation. Review of an ECG back in 2013 was unremarkable for QT prolongation.  Carleene Overlie TaylorMD 02/27/2015, 8:06 PM     Addendum  I have gone back to review the telemetry strips. The patient was in VF for over 2 minutes and was defibrillated with a single shock, restoring NSR. She has mostly settled down since receiving IV magnesium and IV metoprolol. Review of the QT interval demonstrates that the QT is only around 460, and the long coupled interval is not very long. In addition, there is no ST depresion. There is minimal T wave alternation on some of her beats. Prior to the event, she had several brief episodes of non-sustained PMVT.  Mikle Bosworth.D.

## 2015-03-01 ENCOUNTER — Telehealth: Payer: Self-pay | Admitting: Nurse Practitioner

## 2015-03-01 ENCOUNTER — Inpatient Hospital Stay (HOSPITAL_COMMUNITY): Payer: BLUE CROSS/BLUE SHIELD

## 2015-03-01 LAB — BASIC METABOLIC PANEL
ANION GAP: 10 (ref 5–15)
BUN: 6 mg/dL (ref 6–20)
CO2: 22 mmol/L (ref 22–32)
Calcium: 8.9 mg/dL (ref 8.9–10.3)
Chloride: 105 mmol/L (ref 101–111)
Creatinine, Ser: 0.72 mg/dL (ref 0.44–1.00)
GFR calc Af Amer: >60 mL/min (ref >60)
GFR calc non Af Amer: >60 mL/min (ref >60)
Glucose, Bld: 97 mg/dL (ref 65–99)
Potassium: 4 mmol/L (ref 3.5–5.1)
Sodium: 137 mmol/L (ref 135–145)

## 2015-03-01 LAB — MAGNESIUM: MAGNESIUM: 1.9 mg/dL (ref 1.7–2.4)

## 2015-03-01 MED ORDER — NADOLOL 40 MG PO TABS: 40.0000 mg | ORAL_TABLET | Freq: Two times a day (BID) | ORAL | Status: DC

## 2015-03-01 MED ORDER — NADOLOL 40 MG PO TABS: 60.0000 mg | ORAL_TABLET | Freq: Two times a day (BID) | ORAL | Status: DC

## 2015-03-01 MED FILL — Heparin Sodium (Porcine) 2 Unit/ML in Sodium Chloride 0.9%: INTRAMUSCULAR | Qty: 500 | Status: AC

## 2015-03-01 NOTE — Telephone Encounter (Signed)
Pt TOC w/AMber SEiler 8/17 @ 2pm  Per Amber/staff message:   Admitted with syncope and VF arrest - she will need TOC phone call please, going home today.   Appt scheduled with me 8/15   Thank you!  amber

## 2015-03-01 NOTE — Discharge Summary (Signed)
ELECTROPHYSIOLOGY PROCEDURE DISCHARGE SUMMARY    Patient ID: Teresa Henry,  MRN: [id number], DOB/AGE: 12-20-1965 49 y.o.  Admit date: 02/27/2015 Discharge date: 03/01/2015  Primary Care Physician: Garnet Koyanagi, DO Electrophysiologist: Lovena Le  Primary Discharge Diagnosis:  Cardiac arrest of uncertain etiology possibly long QT syndrome  Secondary Discharge Diagnosis:  1.  Migraines  No Known Allergies   Procedures This Admission:  1.  Echocardiogram on 02/27/15 demonstrated normal LV function, moderate concentric hypertrophy, grade 1 diastolic dysfunction.  2.  Catheterization on 02/28/15 demonstrated widely patent coronary arteries, normal LV function 3.  Implantation of a MDT dual chamber ICD on 02/28/15 by Dr Rayann Heman.  The patient received a MDT model number Evera ICD with model number 5076 right atrial lead, 6935M right ventricular lead..  DFT's were successful at 10 J.  There were no immediate post procedure complications. 4.  CXR on 03/01/15 demonstrated no pneumothorax status post device implantation.   Brief HPI/Hospital Course:  Teresa Henry is a 49 y.o. female who presented to the ER for evaluation of syncope.  She was documented to have PMVT on telemetry and had a resuscitated VF arrest requiring defibrillation.  Echocardiogram and catheterization were normal. QT was slightly prolonged.  Electrolytes were normal. She was evaluated by EP who recommended BB therapy and ICD implantation.   Risks, benefits, and alternatives to ICD implantation were reviewed with the patient who wished to proceed. The patient underwent implantation of a MDT dual chamber ICD with details as outlined above. She was monitored on telemetry overnight which demonstrated atrial pacing with intrinsic ventricular conduction.  Left chest was without hematoma or ecchymosis.  The device was interrogated and found to be functioning normally.  CXR was obtained and demonstrated no pneumothorax status post device  implantation.  Wound care, arm mobility, and restrictions were reviewed with the patient.  The patient was examined and considered stable for discharge to home.   No driving x 6 months. We will pursue genetic testing as an outpatient.  Will plan to decrease pacing rate to 70 at post hospital visit.   The patient's discharge medications include a and beta blocker (Nadolol). No ACE-I 2/2 normal LV function   Physical Exam: Filed Vitals:   02/28/15 1440 02/28/15 1509 02/28/15 2030 03/01/15 0554  BP: 142/80 138/89 112/64 110/75  Pulse: 79 80 80 79  Temp:  98.5 F (36.9 C) 98.5 F (36.9 C) 98.9 F (37.2 C)  TempSrc:  Oral Oral Oral  Resp: 19  18 18   Height:  5\' 3"  (1.6 m)    Weight:    149 lb 8 oz (67.813 kg)  SpO2: 93% 93% 96% 96%    GEN- The patient is well appearing, alert and oriented x 3 today.   HEENT: normocephalic, atraumatic; sclera clear, conjunctiva pink; hearing intact; oropharynx clear; neck supple  Lungs- Clear to ausculation bilaterally, normal work of breathing.  No wheezes, rales, rhonchi Heart- Regular rate and rhythm  GI- soft, non-tender, non-distended, bowel sounds present  Extremities- no clubbing, cyanosis, or edema; DP/PT/radial pulses 2+ bilaterally MS- no significant deformity or atrophy Skin- warm and dry, no rash or lesion, left chest without hematoma/ecchymosis Psych- euthymic mood, full affect Neuro- strength and sensation are intact   Labs:   Lab Results  Component Value Date   WBC 11.9* 02/27/2015   HGB 14.1 02/27/2015   HCT 41.2 02/27/2015   MCV 83.1 02/27/2015   PLT 263 02/27/2015    Recent Labs Lab 02/27/15 0415  03/01/15 0448  NA 139  < > 137  K 3.9  < > 4.0  CL 107  < > 105  CO2 23  < > 22  BUN 5*  < > 6  CREATININE 0.79  < > 0.72  CALCIUM 8.8*  < > 8.9  PROT 5.9*  --   --   BILITOT 0.5  --   --   ALKPHOS 77  --   --   ALT 104*  --   --   AST 83*  --   --   GLUCOSE 116*  < > 97  < > = values in this interval not  displayed.  Discharge Medications:    Medication List    TAKE these medications        aspirin 81 MG tablet  Take 81 mg by mouth daily.     nadolol 40 MG tablet  Commonly known as:  CORGARD  Take 1.5 tablets (60 mg total) by mouth 2 (two) times daily.        Disposition:  Discharge Instructions    Diet - low sodium heart healthy    Complete by:  As directed      Increase activity slowly    Complete by:  As directed           Follow-up Information    Follow up with Patsey Berthold, NP On 03/13/2015.   Specialty:  Nurse Practitioner   Why:  at [facility] information:   [place] [address] [phone]       Duration of Discharge Encounter: Greater than 30 minutes including physician time.  Signed, Chanetta Marshall, NP 03/01/2015 7:50 AM   EP Attending  Patient seen and examined. I have reviewed the ICD interogation findings and concur. She is stable for discharge. Continue current meds. Usual followup.  Mikle Bosworth.D.

## 2015-03-01 NOTE — Discharge Instructions (Signed)
° ° °  Supplemental Discharge Instructions for  Pacemaker/Defibrillator Patients  Activity No heavy lifting or vigorous activity with your left/right arm for 6 to 8 weeks.  Do not raise your left/right arm above your head for one week.  Gradually raise your affected arm as drawn below.           __        03/05/15                    03/06/15                       03/07/15                 03/08/15   NO DRIVING for 6 months  WOUND CARE - Keep the wound area clean and dry.  Do not get this area wet for one week. No showers for one week; you may shower on  03/08/15   . - The tape/steri-strips on your wound will fall off; do not pull them off.  No bandage is needed on the site.  DO  NOT apply any creams, oils, or ointments to the wound area. - If you notice any drainage or discharge from the wound, any swelling or bruising at the site, or you develop a fever > 101? F after you are discharged home, call the office at once.  Special Instructions - You are still able to use cellular telephones; use the ear opposite the side where you have your pacemaker/defibrillator.  Avoid carrying your cellular phone near your device. - When traveling through airports, show security personnel your identification card to avoid being screened in the metal detectors.  Ask the security personnel to use the hand wand. - Avoid arc welding equipment, MRI testing (magnetic resonance imaging), TENS units (transcutaneous nerve stimulators).  Call the office for questions about other devices. - Avoid electrical appliances that are in poor condition or are not properly grounded. - Microwave ovens are safe to be near or to operate.  Additional information for defibrillator patients should your device go off: - If your device goes off ONCE and you feel fine afterward, notify the device clinic nurses. - If your device goes off ONCE and you do not feel well afterward, call 911. - If your device goes off TWICE, call 911. - If your  device goes off THREE times in one day, call 911.  DO NOT DRIVE YOURSELF OR A FAMILY MEMBER WITH A DEFIBRILLATOR TO THE HOSPITAL--CALL 911.

## 2015-03-04 NOTE — Telephone Encounter (Signed)
Patient contacted regarding discharge from [facility] on 03/01/15.  Patient understands to follow up with Chanetta Marshall, NP on 03/13/15 at 2:00 pm at [facility]. Patient understands discharge instructions? Yes Patient understands medications and regiment? Yes Patient understands to bring all medications to this visit? Yes

## 2015-03-12 ENCOUNTER — Encounter: Payer: Self-pay | Admitting: Nurse Practitioner

## 2015-03-12 NOTE — Progress Notes (Signed)
Electrophysiology Office Note Date: 03/13/2015  ID:  Teresa Henry, DOB [birth year], MRN [id number]  PCP: Garnet Koyanagi, DO Electrophysiologist: Lovena Le  CC: Post hospital visit for cardiac arrest  Teresa Henry is a 49 y.o. female seen today for Dr Lovena Le.  She presented to the ER earlier this month with syncope. She was documented to have PMVT on telemetry and had a resuscitated VF arrest requiring defibrillation. Echocardiogram and catheterization were normal. QT was slightly prolonged. Electrolytes were normal. She was evaluated by EP who recommended BB therapy and ICD implantation.She underwent MDT dual chamber ICD implant prior to discharge and was started on Nadolol.  She presents today for hospitalization follow up. Since discharge, the patient reports doing very well. She denies chest pain, palpitations, dyspnea, PND, orthopnea, nausea, vomiting, dizziness, syncope, edema, weight gain, or early satiety.  She has not had ICD shocks.   Device History: MDT dual chamber ICD implanted 2016 for cardiac arrest History of appropriate therapy: No History of AAD therapy: No   Past Medical History  Diagnosis Date  . Frequent episodic tension-type headache   . Cardiac arrest     a. s/p MDT dual chamber ICD  . Prolonged Q-T interval on ECG    Past Surgical History  Procedure Laterality Date  . Breast biopsy  1999    Needle Biopsy- Non cancer  . Cardiac catheterization N/A 02/28/2015    Procedure: Left Heart Cath and Coronary Angiography;  Surgeon: Belva Crome, MD;  Location: [facility];  Service: Cardiovascular;  Laterality: N/A;  . Ep implantable device N/A 02/28/2015    MDT dual chamber ICD implanted by Dr Rayann Heman for secondary prevention    Current Outpatient Prescriptions  Medication Sig Dispense Refill  . aspirin 81 MG tablet Take 81 mg by mouth daily.    . nadolol (CORGARD) 40 MG tablet Take 1.5 tablets (60 mg total) by mouth 2 (two) times daily. 90 tablet 11    No current facility-administered medications for this visit.    Allergies:   Review of patient's allergies indicates no known allergies.   Social History: Social History   Social History  . Marital Status: Married    Spouse Name: N/A  . Number of Children: N/A  . Years of Education: N/A   Occupational History  . Agricultural engineer-- self employed    Social History Main Topics  . Smoking status: Never Smoker   . Smokeless tobacco: Never Used  . Alcohol Use: No  . Drug Use: No  . Sexual Activity:    Partners: Male   Other Topics Concern  . Not on file   Social History Narrative    Family History: Family History  Problem Relation Age of Onset  . Arthritis Mother   . Hypertension Mother   . Arthritis Father   . Hypertension Father   . Stroke Maternal Grandmother   . Stroke Paternal Grandmother   . Heart attack Brother 35    Review of Systems: All other systems reviewed and are otherwise negative except as noted above.   Physical Exam: VS:  BP 110/74 mmHg  Pulse 80  Ht 5\' 3"  (1.6 m)  Wt 153 lb (69.4 kg)  BMI 27.11 kg/m2  SpO2 98%  LMP 02/27/2015 , BMI Body mass index is 27.11 kg/(m^2).  GEN- The patient is well appearing, alert and oriented x 3 today.   HEENT: normocephalic, atraumatic; sclera clear, conjunctiva pink; hearing intact; oropharynx clear; neck supple Lungs- Clear to ausculation  bilaterally, normal work of breathing.  No wheezes, rales, rhonchi Heart- Regular rate and rhythm, no murmurs, rubs or gallops  GI- soft, non-tender, non-distended, bowel sounds present  Extremities- no clubbing, cyanosis, or edema; DP/PT/radial pulses 2+ bilaterally MS- no significant deformity or atrophy Skin- warm and dry, no rash or lesion; ICD pocket well healed Psych- euthymic mood, full affect Neuro- strength and sensation are intact  ICD interrogation- reviewed in detail today,  See PACEART report  EKG:  EKG is not ordered today.   Recent  Labs: 02/27/2015: ALT 104*; Hemoglobin 14.1; Platelets 263; TSH 0.488 03/01/2015: BUN 6; Creatinine, Ser 0.72; Magnesium 1.9; Potassium 4.0; Sodium 137   Wt Readings from Last 3 Encounters:  03/13/15 153 lb (69.4 kg)  03/01/15 149 lb 8 oz (67.813 kg)  02/26/15 155 lb (70.308 kg)     Other studies Reviewed: Additional studies/ records that were reviewed today include: hospital records  Assessment and Plan:  1.  Cardiac arrest of uncertain etiology/possibly long QT syndrome Normal ICD function - wound well healed See Pace Art report - base rate decreased to 70 today Will order genetic testing today BMET, Mg Keep K>3.9, Mg >1.8 No driving x6 months (pt aware)   Current medicines are reviewed at length with the patient today.   The patient does not have concerns regarding her medicines.  The following changes were made today:  none  Labs/ tests ordered today include: BMET, Mg, genetic testing   Disposition:   Follow up with Dr Lovena Le in 3 months   Signed, Chanetta Marshall, NP 03/13/2015 2:45 PM  [facility] [address] [place] [address] [phone] (office) [phone] (fax)

## 2015-03-13 ENCOUNTER — Ambulatory Visit (INDEPENDENT_AMBULATORY_CARE_PROVIDER_SITE_OTHER): Payer: BLUE CROSS/BLUE SHIELD | Admitting: Nurse Practitioner

## 2015-03-13 ENCOUNTER — Encounter: Payer: Self-pay | Admitting: Internal Medicine

## 2015-03-13 ENCOUNTER — Encounter: Payer: Self-pay | Admitting: Nurse Practitioner

## 2015-03-13 VITALS — BP 110/74 | HR 80 | Ht 63.0 in | Wt 153.0 lb

## 2015-03-13 DIAGNOSIS — I4581 Long QT syndrome: Secondary | ICD-10-CM | POA: Diagnosis not present

## 2015-03-13 DIAGNOSIS — R9431 Abnormal electrocardiogram [ECG] [EKG]: Secondary | ICD-10-CM

## 2015-03-13 DIAGNOSIS — I469 Cardiac arrest, cause unspecified: Secondary | ICD-10-CM | POA: Diagnosis not present

## 2015-03-13 LAB — BASIC METABOLIC PANEL
BUN: 12 mg/dL (ref 6–23)
CO2: 30 meq/L (ref 19–32)
Calcium: 9.5 mg/dL (ref 8.4–10.5)
Chloride: 101 mEq/L (ref 96–112)
Creatinine, Ser: 0.78 mg/dL (ref 0.40–1.20)
GFR: 83.45 mL/min (ref >60.00)
GLUCOSE: 91 mg/dL (ref 70–99)
POTASSIUM: 3.7 meq/L (ref 3.5–5.1)
Sodium: 137 mEq/L (ref 135–145)

## 2015-03-13 LAB — MAGNESIUM: Magnesium: 2.1 mg/dL (ref 1.5–2.5)

## 2015-03-13 NOTE — Patient Instructions (Signed)
Medication Instructions:   Your physician recommends that you continue on your current medications as directed. Please refer to the Current Medication list given to you today.    Labwork:  BMET AND MAG    Testing/Procedures:   Follow-Up:  IN 3 MONTHS WITH DR Lovena Le    Any Other Special Instructions Will Be Listed Below (If Applicable).

## 2015-03-18 LAB — CUP PACEART INCLINIC DEVICE CHECK
Date Time Interrogation Session: 20160822133503
Lead Channel Setting Pacing Amplitude: 3.5 V
Lead Channel Setting Pacing Pulse Width: 0.4 ms
Lead Channel Setting Sensing Sensitivity: 0.3 mV
MDC IDC SET LEADCHNL RA PACING AMPLITUDE: 3.5 V
MDC IDC SET ZONE DETECTION INTERVAL: 320 ms
MDC IDC SET ZONE DETECTION INTERVAL: 350 ms
Zone Setting Detection Interval: 270 ms
Zone Setting Detection Interval: 360 ms

## 2015-05-24 ENCOUNTER — Encounter: Payer: BC Managed Care – PPO | Admitting: Family Medicine

## 2015-06-25 ENCOUNTER — Ambulatory Visit (INDEPENDENT_AMBULATORY_CARE_PROVIDER_SITE_OTHER): Payer: BLUE CROSS/BLUE SHIELD | Admitting: Internal Medicine

## 2015-06-25 ENCOUNTER — Encounter: Payer: Self-pay | Admitting: Internal Medicine

## 2015-06-25 VITALS — BP 120/82 | HR 87 | Ht 62.0 in | Wt 160.4 lb

## 2015-06-25 DIAGNOSIS — I472 Ventricular tachycardia: Secondary | ICD-10-CM | POA: Diagnosis not present

## 2015-06-25 DIAGNOSIS — I495 Sick sinus syndrome: Secondary | ICD-10-CM

## 2015-06-25 DIAGNOSIS — I422 Other hypertrophic cardiomyopathy: Secondary | ICD-10-CM | POA: Diagnosis not present

## 2015-06-25 DIAGNOSIS — I4721 Torsades de pointes: Secondary | ICD-10-CM

## 2015-06-25 NOTE — Progress Notes (Signed)
      HPI [name] returns today for followup. She is a 49 yo woman with a h/o syncope who was found to have VF and successfully defibrillated while in the hospital several months ago. Her QT interval was increased and she was given a presumptive diagnosis of long QT. She was noted to have an LV thickness of nearly 16 mm. She also had some sinus bradycardia. She underwent insertion of a DDD ICD and has undergone genetic testing which failed to reveal a QT prolonging gene. In the interim she has adjusted to taking her Nadolol. She has had no recurrent ventricular arrhythmias. She has many questions regarding genetic counseling.  No Known Allergies   Current Outpatient Prescriptions  Medication Sig Dispense Refill  . aspirin 81 MG tablet Take 81 mg by mouth daily.    . nadolol (CORGARD) 40 MG tablet Take 1.5 tablets (60 mg total) by mouth 2 (two) times daily. 90 tablet 11   No current facility-administered medications for this visit.     Past Medical History  Diagnosis Date  . Frequent episodic tension-type headache   . Cardiac arrest [facility])     a. s/p MDT dual chamber ICD  . Prolonged Q-T interval on ECG     ROS:   All systems reviewed and negative except as noted in the HPI.   Past Surgical History  Procedure Laterality Date  . Breast biopsy  1999    Needle Biopsy- Non cancer  . Cardiac catheterization N/A 02/28/2015    Procedure: Left Heart Cath and Coronary Angiography;  Surgeon: Belva Crome, MD;  Location: [facility];  Service: Cardiovascular;  Laterality: N/A;  . Ep implantable device N/A 02/28/2015    MDT dual chamber ICD implanted by Dr Rayann Heman for secondary prevention     Family History  Problem Relation Age of Onset  . Arthritis Mother   . Hypertension Mother   . Arthritis Father   . Hypertension Father   . Stroke Maternal Grandmother   . Stroke Paternal Grandmother   . Heart attack Brother 78     Social History   Social History  . Marital  Status: Married    Spouse Name: N/A  . Number of Children: N/A  . Years of Education: N/A   Occupational History  . Agricultural engineer-- self employed    Social History Main Topics  . Smoking status: Never Smoker   . Smokeless tobacco: Never Used  . Alcohol Use: No  . Drug Use: No  . Sexual Activity:    Partners: Male   Other Topics Concern  . Not on file   Social History Narrative     BP 120/82 mmHg  Pulse 87  Ht 5\' 2"  (1.575 m)  Wt 160 lb 6.4 oz (72.757 kg)  BMI 29.33 kg/m2  Physical Exam:  Well appearing 49 yo woman, NAD HEENT: Unremarkable Neck:  6 cm JVD, no thyromegally Lymphatics:  No adenopathy Back:  No CVA tenderness Lungs:  Clear with no wheezes HEART:  Regular rate rhythm, no murmurs, no rubs, no clicks Abd:  soft, positive bowel sounds, no organomegally, no rebound, no guarding Ext:  2 plus pulses, no edema, no cyanosis, no clubbing Skin:  No rashes no nodules Neuro:  CN II through XII intact, motor grossly intact  EKG - NSR with atrial pacing. Normal QT  DEVICE  Normal device function.  See PaceArt for details.   Assess/Plan:

## 2015-06-25 NOTE — Patient Instructions (Signed)
Medication Instructions:  .isntc   Labwork: None ordered   Testing/Procedures: None ordered   Follow-Up: Your physician wants you to follow-up in: July 2017 with Dr Knox Saliva will receive a reminder letter in the mail two months in advance. If you don't receive a letter, please call our office to schedule the follow-up appointment.  Remote monitoring is used to monitor your ICD from home. This monitoring reduces the number of office visits required to check your device to one time per year. It allows [place] to keep an eye on the functioning of your device to ensure it is working properly. You are scheduled for a device check from home on 09/24/15. You may send your transmission at any time that day. If you have a wireless device, the transmission will be sent automatically. After your physician reviews your transmission, you will receive a postcard with your next transmission date.     Any Other Special Instructions Will Be Listed Below (If Applicable).     If you need a refill on your cardiac medications before your next appointment, please call your pharmacy.

## 2015-06-25 NOTE — Assessment & Plan Note (Signed)
It is still unclear to me as to whether she has HCM morphologically. Her ventricular thickness is increased. She is not obstructed. She is pending genetic testing for the known HCM genes. If she does have an HCM gene, then I have counseled her that her children will need genetic screening and possibly and echo.

## 2015-06-25 NOTE — Assessment & Plan Note (Signed)
She has had no recurrent episodes on her Nadolol. Will follow.

## 2015-06-25 NOTE — Assessment & Plan Note (Signed)
She is currently pacing much of the time in the atrium. Wil follow. I will consider reducing her lower rate.

## 2015-07-04 ENCOUNTER — Encounter: Payer: Self-pay | Admitting: Internal Medicine

## 2015-07-18 LAB — CUP PACEART INCLINIC DEVICE CHECK
Battery Remaining Longevity: 114 mo
Brady Statistic AP VP Percent: 0.05 %
Brady Statistic RA Percent Paced: 85.61 %
Brady Statistic RV Percent Paced: 0.05 %
HIGH POWER IMPEDANCE MEASURED VALUE: 64 Ohm
Implantable Lead Implant Date: 20160804
Implantable Lead Location: 753859
Implantable Lead Location: 753860
Implantable Lead Model: 5076
Lead Channel Impedance Value: 456 Ohm
Lead Channel Impedance Value: 513 Ohm
Lead Channel Impedance Value: 608 Ohm
Lead Channel Pacing Threshold Pulse Width: 0.4 ms
Lead Channel Sensing Intrinsic Amplitude: 1 mV
Lead Channel Setting Pacing Amplitude: 1.5 V
Lead Channel Setting Pacing Amplitude: 2 V
Lead Channel Setting Pacing Pulse Width: 0.4 ms
MDC IDC LEAD IMPLANT DT: 20160804
MDC IDC MSMT BATTERY VOLTAGE: 3.03 V
MDC IDC MSMT LEADCHNL RA PACING THRESHOLD AMPLITUDE: 0.625 V
MDC IDC MSMT LEADCHNL RV PACING THRESHOLD AMPLITUDE: 0.5 V
MDC IDC MSMT LEADCHNL RV PACING THRESHOLD PULSEWIDTH: 0.4 ms
MDC IDC MSMT LEADCHNL RV SENSING INTR AMPL: 9.5 mV
MDC IDC SESS DTM: 20161129170705
MDC IDC SET LEADCHNL RV SENSING SENSITIVITY: 0.3 mV
MDC IDC STAT BRADY AP VS PERCENT: 85.57 %
MDC IDC STAT BRADY AS VP PERCENT: 0.01 %
MDC IDC STAT BRADY AS VS PERCENT: 14.38 %

## 2015-09-24 ENCOUNTER — Telehealth: Payer: Self-pay | Admitting: Cardiology

## 2015-09-24 ENCOUNTER — Ambulatory Visit (INDEPENDENT_AMBULATORY_CARE_PROVIDER_SITE_OTHER): Payer: 59 | Admitting: *Deleted

## 2015-09-24 DIAGNOSIS — R9431 Abnormal electrocardiogram [ECG] [EKG]: Secondary | ICD-10-CM

## 2015-09-24 DIAGNOSIS — I4581 Long QT syndrome: Secondary | ICD-10-CM | POA: Diagnosis not present

## 2015-09-24 DIAGNOSIS — I422 Other hypertrophic cardiomyopathy: Secondary | ICD-10-CM

## 2015-09-24 DIAGNOSIS — I495 Sick sinus syndrome: Secondary | ICD-10-CM | POA: Diagnosis not present

## 2015-09-24 NOTE — Progress Notes (Signed)
Remote ICD transmission.   

## 2015-09-24 NOTE — Telephone Encounter (Signed)
Spoke with pt and reminded pt of remote transmission that is due today. Pt verbalized understanding.   

## 2015-10-25 ENCOUNTER — Encounter: Payer: Self-pay | Admitting: Cardiology

## 2015-10-25 LAB — CUP PACEART REMOTE DEVICE CHECK
Brady Statistic AP VS Percent: 86.69 %
Brady Statistic AS VP Percent: 0 %
Brady Statistic AS VS Percent: 13.27 %
Date Time Interrogation Session: 20170228170549
HighPow Impedance: 63 Ohm
Implantable Lead Implant Date: 20160804
Implantable Lead Location: 753859
Implantable Lead Location: 753860
Implantable Lead Model: 5076
Lead Channel Impedance Value: 532 Ohm
Lead Channel Impedance Value: 551 Ohm
Lead Channel Pacing Threshold Pulse Width: 0.4 ms
Lead Channel Sensing Intrinsic Amplitude: 0.875 mV
Lead Channel Sensing Intrinsic Amplitude: 0.875 mV
Lead Channel Sensing Intrinsic Amplitude: 9.375 mV
Lead Channel Setting Pacing Pulse Width: 0.4 ms
MDC IDC LEAD IMPLANT DT: 20160804
MDC IDC MSMT BATTERY REMAINING LONGEVITY: 113 mo
MDC IDC MSMT BATTERY VOLTAGE: 3.01 V
MDC IDC MSMT LEADCHNL RA PACING THRESHOLD AMPLITUDE: 0.75 V
MDC IDC MSMT LEADCHNL RA PACING THRESHOLD PULSEWIDTH: 0.4 ms
MDC IDC MSMT LEADCHNL RV IMPEDANCE VALUE: 456 Ohm
MDC IDC MSMT LEADCHNL RV PACING THRESHOLD AMPLITUDE: 0.5 V
MDC IDC MSMT LEADCHNL RV SENSING INTR AMPL: 9.375 mV
MDC IDC SET LEADCHNL RA PACING AMPLITUDE: 1.5 V
MDC IDC SET LEADCHNL RV PACING AMPLITUDE: 2 V
MDC IDC SET LEADCHNL RV SENSING SENSITIVITY: 0.3 mV
MDC IDC STAT BRADY AP VP PERCENT: 0.04 %
MDC IDC STAT BRADY RA PERCENT PACED: 86.73 %
MDC IDC STAT BRADY RV PERCENT PACED: 0.04 %

## 2015-12-02 ENCOUNTER — Ambulatory Visit (INDEPENDENT_AMBULATORY_CARE_PROVIDER_SITE_OTHER): Payer: 59 | Admitting: Family Medicine

## 2015-12-02 ENCOUNTER — Encounter: Payer: Self-pay | Admitting: Family Medicine

## 2015-12-02 VITALS — BP 114/80 | HR 73 | Temp 98.4°F | Ht 62.75 in | Wt 155.6 lb

## 2015-12-02 DIAGNOSIS — Z0000 Encounter for general adult medical examination without abnormal findings: Secondary | ICD-10-CM | POA: Diagnosis not present

## 2015-12-02 DIAGNOSIS — Z8674 Personal history of sudden cardiac arrest: Secondary | ICD-10-CM | POA: Diagnosis not present

## 2015-12-02 LAB — COMPREHENSIVE METABOLIC PANEL
ALT: 22 U/L (ref 0–35)
AST: 18 U/L (ref 0–37)
Albumin: 4.3 g/dL (ref 3.5–5.2)
Alkaline Phosphatase: 82 U/L (ref 39–117)
BILIRUBIN TOTAL: 0.5 mg/dL (ref 0.2–1.2)
BUN: 13 mg/dL (ref 6–23)
CALCIUM: 9.5 mg/dL (ref 8.4–10.5)
CHLORIDE: 103 meq/L (ref 96–112)
CO2: 26 meq/L (ref 19–32)
CREATININE: 0.81 mg/dL (ref 0.40–1.20)
GFR: 79.65 mL/min (ref >60.00)
Glucose, Bld: 102 mg/dL — ABNORMAL HIGH (ref 70–99)
Potassium: 4.3 mEq/L (ref 3.5–5.1)
SODIUM: 136 meq/L (ref 135–145)
Total Protein: 7.1 g/dL (ref 6.0–8.3)

## 2015-12-02 LAB — CBC WITH DIFFERENTIAL/PLATELET
BASOS ABS: 0.1 10*3/uL (ref 0.0–0.1)
Basophils Relative: 0.8 % (ref 0.0–3.0)
EOS ABS: 2 10*3/uL — AB (ref 0.0–0.7)
Eosinophils Relative: 18.4 % — ABNORMAL HIGH (ref 0.0–5.0)
HEMATOCRIT: 43.2 % (ref 36.0–46.0)
HEMOGLOBIN: 14.7 g/dL (ref 12.0–15.0)
LYMPHS PCT: 25.9 % (ref 12.0–46.0)
Lymphs Abs: 2.8 10*3/uL (ref 0.7–4.0)
MCHC: 34 g/dL (ref 30.0–36.0)
MCV: 83.3 fl (ref 78.0–100.0)
MONO ABS: 0.7 10*3/uL (ref 0.1–1.0)
Monocytes Relative: 6.3 % (ref 3.0–12.0)
NEUTROS ABS: 5.2 10*3/uL (ref 1.4–7.7)
Neutrophils Relative %: 48.6 % (ref 43.0–77.0)
PLATELETS: 263 10*3/uL (ref 150.0–400.0)
RBC: 5.19 Mil/uL — ABNORMAL HIGH (ref 3.87–5.11)
RDW: 13.4 % (ref 11.5–15.5)
WBC: 10.8 10*3/uL — AB (ref 4.0–10.5)

## 2015-12-02 LAB — POCT URINALYSIS DIPSTICK
Bilirubin, UA: NEGATIVE
Glucose, UA: NEGATIVE
Ketones, UA: NEGATIVE
LEUKOCYTES UA: NEGATIVE
NITRITE UA: NEGATIVE
PH UA: 6
Spec Grav, UA: >=1.03
Urobilinogen, UA: NEGATIVE

## 2015-12-02 LAB — LIPID PANEL
CHOL/HDL RATIO: 5
Cholesterol: 170 mg/dL (ref 0–200)
HDL: 31.6 mg/dL — ABNORMAL LOW (ref >39.00)
LDL Cholesterol: 113 mg/dL — ABNORMAL HIGH (ref 0–99)
NONHDL: 137.99
Triglycerides: 123 mg/dL (ref 0.0–149.0)
VLDL: 24.6 mg/dL (ref 0.0–40.0)

## 2015-12-02 LAB — TSH: TSH: 1.36 u[IU]/mL (ref 0.35–4.50)

## 2015-12-02 NOTE — Progress Notes (Signed)
Pre visit review using our clinic review tool, if applicable. No additional management support is needed unless otherwise documented below in the visit note. 

## 2015-12-02 NOTE — Patient Instructions (Signed)
Preventive Care for Adults, Female A healthy lifestyle and preventive care can promote health and wellness. Preventive health guidelines for women include the following key practices.  A routine yearly physical is a good way to check with your health care provider about your health and preventive screening. It is a chance to share any concerns and updates on your health and to receive a thorough exam.  Visit your dentist for a routine exam and preventive care every 6 months. Brush your teeth twice a day and floss once a day. Good oral hygiene prevents tooth decay and gum disease.  The frequency of eye exams is based on your age, health, family medical history, use of contact lenses, and other factors. Follow your health care provider's recommendations for frequency of eye exams.  Eat a healthy diet. Foods like vegetables, fruits, whole grains, low-fat dairy products, and lean protein foods contain the nutrients you need without too many calories. Decrease your intake of foods high in solid fats, added sugars, and salt. Eat the right amount of calories for you.Get information about a proper diet from your health care provider, if necessary.  Regular physical exercise is one of the most important things you can do for your health. Most adults should get at least 150 minutes of moderate-intensity exercise (any activity that increases your heart rate and causes you to sweat) each week. In addition, most adults need muscle-strengthening exercises on 2 or more days a week.  Maintain a healthy weight. The body mass index (BMI) is a screening tool to identify possible weight problems. It provides an estimate of body fat based on height and weight. Your health care provider can find your BMI and can help you achieve or maintain a healthy weight.For adults 20 years and older:  A BMI below 18.5 is considered underweight.  A BMI of 18.5 to 24.9 is normal.  A BMI of 25 to 29.9 is considered overweight.  A  BMI of 30 and above is considered obese.  Maintain normal blood lipids and cholesterol levels by exercising and minimizing your intake of saturated fat. Eat a balanced diet with plenty of fruit and vegetables. Blood tests for lipids and cholesterol should begin at age 45 and be repeated every 5 years. If your lipid or cholesterol levels are high, you are over 50, or you are at high risk for heart disease, you may need your cholesterol levels checked more frequently.Ongoing high lipid and cholesterol levels should be treated with medicines if diet and exercise are not working.  If you smoke, find out from your health care provider how to quit. If you do not use tobacco, do not start.  Lung cancer screening is recommended for adults aged 45-80 years who are at high risk for developing lung cancer because of a history of smoking. A yearly low-dose CT scan of the lungs is recommended for people who have at least a 30-pack-year history of smoking and are a current smoker or have quit within the past 15 years. A pack year of smoking is smoking an average of 1 pack of cigarettes a day for 1 year (for example: 1 pack a day for 30 years or 2 packs a day for 15 years). Yearly screening should continue until the smoker has stopped smoking for at least 15 years. Yearly screening should be stopped for people who develop a health problem that would prevent them from having lung cancer treatment.  If you are pregnant, do not drink alcohol. If you are  breastfeeding, be very cautious about drinking alcohol. If you are not pregnant and choose to drink alcohol, do not have more than 1 drink per day. One drink is considered to be 12 ounces (355 mL) of beer, 5 ounces (148 mL) of wine, or 1.5 ounces (44 mL) of liquor.  Avoid use of street drugs. Do not share needles with anyone. Ask for help if you need support or instructions about stopping the use of drugs.  High blood pressure causes heart disease and increases the risk  of stroke. Your blood pressure should be checked at least every 1 to 2 years. Ongoing high blood pressure should be treated with medicines if weight loss and exercise do not work.  If you are 55-79 years old, ask your health care provider if you should take aspirin to prevent strokes.  Diabetes screening is done by taking a blood sample to check your blood glucose level after you have not eaten for a certain period of time (fasting). If you are not overweight and you do not have risk factors for diabetes, you should be screened once every 3 years starting at age 45. If you are overweight or obese and you are 40-70 years of age, you should be screened for diabetes every year as part of your cardiovascular risk assessment.  Breast cancer screening is essential preventive care for women. You should practice "breast self-awareness." This means understanding the normal appearance and feel of your breasts and may include breast self-examination. Any changes detected, no matter how small, should be reported to a health care provider. Women in their 20s and 30s should have a clinical breast exam (CBE) by a health care provider as part of a regular health exam every 1 to 3 years. After age 40, women should have a CBE every year. Starting at age 40, women should consider having a mammogram (breast X-ray test) every year. Women who have a family history of breast cancer should talk to their health care provider about genetic screening. Women at a high risk of breast cancer should talk to their health care providers about having an MRI and a mammogram every year.  Breast cancer gene (BRCA)-related cancer risk assessment is recommended for women who have family members with BRCA-related cancers. BRCA-related cancers include breast, ovarian, tubal, and peritoneal cancers. Having family members with these cancers may be associated with an increased risk for harmful changes (mutations) in the breast cancer genes BRCA1 and  BRCA2. Results of the assessment will determine the need for genetic counseling and BRCA1 and BRCA2 testing.  Your health care provider may recommend that you be screened regularly for cancer of the pelvic organs (ovaries, uterus, and vagina). This screening involves a pelvic examination, including checking for microscopic changes to the surface of your cervix (Pap test). You may be encouraged to have this screening done every 3 years, beginning at age 21.  For women ages 30-65, health care providers may recommend pelvic exams and Pap testing every 3 years, or they may recommend the Pap and pelvic exam, combined with testing for human papilloma virus (HPV), every 5 years. Some types of HPV increase your risk of cervical cancer. Testing for HPV may also be done on women of any age with unclear Pap test results.  Other health care providers may not recommend any screening for nonpregnant women who are considered low risk for pelvic cancer and who do not have symptoms. Ask your health care provider if a screening pelvic exam is right for   you.  If you have had past treatment for cervical cancer or a condition that could lead to cancer, you need Pap tests and screening for cancer for at least 20 years after your treatment. If Pap tests have been discontinued, your risk factors (such as having a new sexual partner) need to be reassessed to determine if screening should resume. Some women have medical problems that increase the chance of getting cervical cancer. In these cases, your health care provider may recommend more frequent screening and Pap tests.  Colorectal cancer can be detected and often prevented. Most routine colorectal cancer screening begins at the age of 50 years and continues through age 75 years. However, your health care provider may recommend screening at an earlier age if you have risk factors for colon cancer. On a yearly basis, your health care provider may provide home test kits to check  for hidden blood in the stool. Use of a small camera at the end of a tube, to directly examine the colon (sigmoidoscopy or colonoscopy), can detect the earliest forms of colorectal cancer. Talk to your health care provider about this at age 50, when routine screening begins. Direct exam of the colon should be repeated every 5-10 years through age 75 years, unless early forms of precancerous polyps or small growths are found.  People who are at an increased risk for hepatitis B should be screened for this virus. You are considered at high risk for hepatitis B if:  You were born in a country where hepatitis B occurs often. Talk with your health care provider about which countries are considered high risk.  Your parents were born in a high-risk country and you have not received a shot to protect against hepatitis B (hepatitis B vaccine).  You have HIV or AIDS.  You use needles to inject street drugs.  You live with, or have sex with, someone who has hepatitis B.  You get hemodialysis treatment.  You take certain medicines for conditions like cancer, organ transplantation, and autoimmune conditions.  Hepatitis C blood testing is recommended for all people born from 1945 through 1965 and any individual with known risks for hepatitis C.  Practice safe sex. Use condoms and avoid high-risk sexual practices to reduce the spread of sexually transmitted infections (STIs). STIs include gonorrhea, chlamydia, syphilis, trichomonas, herpes, HPV, and human immunodeficiency virus (HIV). Herpes, HIV, and HPV are viral illnesses that have no cure. They can result in disability, cancer, and death.  You should be screened for sexually transmitted illnesses (STIs) including gonorrhea and chlamydia if:  You are sexually active and are younger than 24 years.  You are older than 24 years and your health care provider tells you that you are at risk for this type of infection.  Your sexual activity has changed  since you were last screened and you are at an increased risk for chlamydia or gonorrhea. Ask your health care provider if you are at risk.  If you are at risk of being infected with HIV, it is recommended that you take a prescription medicine daily to prevent HIV infection. This is called preexposure prophylaxis (PrEP). You are considered at risk if:  You are sexually active and do not regularly use condoms or know the HIV status of your partner(s).  You take drugs by injection.  You are sexually active with a partner who has HIV.  Talk with your health care provider about whether you are at high risk of being infected with HIV. If   you choose to begin PrEP, you should first be tested for HIV. You should then be tested every 3 months for as long as you are taking PrEP.  Osteoporosis is a disease in which the bones lose minerals and strength with aging. This can result in serious bone fractures or breaks. The risk of osteoporosis can be identified using a bone density scan. Women ages 67 years and over and women at risk for fractures or osteoporosis should discuss screening with their health care providers. Ask your health care provider whether you should take a calcium supplement or vitamin D to reduce the rate of osteoporosis.  Menopause can be associated with physical symptoms and risks. Hormone replacement therapy is available to decrease symptoms and risks. You should talk to your health care provider about whether hormone replacement therapy is right for you.  Use sunscreen. Apply sunscreen liberally and repeatedly throughout the day. You should seek shade when your shadow is shorter than you. Protect yourself by wearing long sleeves, pants, a wide-brimmed hat, and sunglasses year round, whenever you are outdoors.  Once a month, do a whole body skin exam, using a mirror to look at the skin on your back. Tell your health care provider of new moles, moles that have irregular borders, moles that  are larger than a pencil eraser, or moles that have changed in shape or color.  Stay current with required vaccines (immunizations).  Influenza vaccine. All adults should be immunized every year.  Tetanus, diphtheria, and acellular pertussis (Td, Tdap) vaccine. Pregnant women should receive 1 dose of Tdap vaccine during each pregnancy. The dose should be obtained regardless of the length of time since the last dose. Immunization is preferred during the 27th-36th week of gestation. An adult who has not previously received Tdap or who does not know her vaccine status should receive 1 dose of Tdap. This initial dose should be followed by tetanus and diphtheria toxoids (Td) booster doses every 10 years. Adults with an unknown or incomplete history of completing a 3-dose immunization series with Td-containing vaccines should begin or complete a primary immunization series including a Tdap dose. Adults should receive a Td booster every 10 years.  Varicella vaccine. An adult without evidence of immunity to varicella should receive 2 doses or a second dose if she has previously received 1 dose. Pregnant females who do not have evidence of immunity should receive the first dose after pregnancy. This first dose should be obtained before leaving the health care facility. The second dose should be obtained 4-8 weeks after the first dose.  Human papillomavirus (HPV) vaccine. Females aged 13-26 years who have not received the vaccine previously should obtain the 3-dose series. The vaccine is not recommended for use in pregnant females. However, pregnancy testing is not needed before receiving a dose. If a female is found to be pregnant after receiving a dose, no treatment is needed. In that case, the remaining doses should be delayed until after the pregnancy. Immunization is recommended for any person with an immunocompromised condition through the age of 61 years if she did not get any or all doses earlier. During the  3-dose series, the second dose should be obtained 4-8 weeks after the first dose. The third dose should be obtained 24 weeks after the first dose and 16 weeks after the second dose.  Zoster vaccine. One dose is recommended for adults aged 30 years or older unless certain conditions are present.  Measles, mumps, and rubella (MMR) vaccine. Adults born  before 1957 generally are considered immune to measles and mumps. Adults born in 1957 or later should have 1 or more doses of MMR vaccine unless there is a contraindication to the vaccine or there is laboratory evidence of immunity to each of the three diseases. A routine second dose of MMR vaccine should be obtained at least 28 days after the first dose for students attending postsecondary schools, health care workers, or international travelers. People who received inactivated measles vaccine or an unknown type of measles vaccine during 1963-1967 should receive 2 doses of MMR vaccine. People who received inactivated mumps vaccine or an unknown type of mumps vaccine before 1979 and are at high risk for mumps infection should consider immunization with 2 doses of MMR vaccine. For females of childbearing age, rubella immunity should be determined. If there is no evidence of immunity, females who are not pregnant should be vaccinated. If there is no evidence of immunity, females who are pregnant should delay immunization until after pregnancy. Unvaccinated health care workers born before 1957 who lack laboratory evidence of measles, mumps, or rubella immunity or laboratory confirmation of disease should consider measles and mumps immunization with 2 doses of MMR vaccine or rubella immunization with 1 dose of MMR vaccine.  Pneumococcal 13-valent conjugate (PCV13) vaccine. When indicated, a person who is uncertain of his immunization history and has no record of immunization should receive the PCV13 vaccine. All adults 65 years of age and older should receive this  vaccine. An adult aged 19 years or older who has certain medical conditions and has not been previously immunized should receive 1 dose of PCV13 vaccine. This PCV13 should be followed with a dose of pneumococcal polysaccharide (PPSV23) vaccine. Adults who are at high risk for pneumococcal disease should obtain the PPSV23 vaccine at least 8 weeks after the dose of PCV13 vaccine. Adults older than 50 years of age who have normal immune system function should obtain the PPSV23 vaccine dose at least 1 year after the dose of PCV13 vaccine.  Pneumococcal polysaccharide (PPSV23) vaccine. When PCV13 is also indicated, PCV13 should be obtained first. All adults aged 65 years and older should be immunized. An adult younger than age 65 years who has certain medical conditions should be immunized. Any person who resides in a nursing home or long-term care facility should be immunized. An adult smoker should be immunized. People with an immunocompromised condition and certain other conditions should receive both PCV13 and PPSV23 vaccines. People with human immunodeficiency virus (HIV) infection should be immunized as soon as possible after diagnosis. Immunization during chemotherapy or radiation therapy should be avoided. Routine use of PPSV23 vaccine is not recommended for American Indians, [place] Natives, or people younger than 65 years unless there are medical conditions that require PPSV23 vaccine. When indicated, people who have unknown immunization and have no record of immunization should receive PPSV23 vaccine. One-time revaccination 5 years after the first dose of PPSV23 is recommended for people aged 19-64 years who have chronic kidney failure, nephrotic syndrome, asplenia, or immunocompromised conditions. People who received 1-2 doses of PPSV23 before age 65 years should receive another dose of PPSV23 vaccine at age 65 years or later if at least 5 years have passed since the previous dose. Doses of PPSV23 are not  needed for people immunized with PPSV23 at or after age 65 years.  Meningococcal vaccine. Adults with asplenia or persistent complement component deficiencies should receive 2 doses of quadrivalent meningococcal conjugate (MenACWY-D) vaccine. The doses should be obtained   at least 2 months apart. Microbiologists working with certain meningococcal bacteria, [place] recruits, people at risk during an outbreak, and people who travel to or live in countries with a high rate of meningitis should be immunized. A first-year college student up through age 34 years who is living in a residence hall should receive a dose if she did not receive a dose on or after her 16th birthday. Adults who have certain high-risk conditions should receive one or more doses of vaccine.  Hepatitis A vaccine. Adults who wish to be protected from this disease, have certain high-risk conditions, work with hepatitis A-infected animals, work in hepatitis A research labs, or travel to or work in countries with a high rate of hepatitis A should be immunized. Adults who were previously unvaccinated and who anticipate close contact with an international adoptee during the first 60 days after arrival in the [place] from a country with a high rate of hepatitis A should be immunized.  Hepatitis B vaccine. Adults who wish to be protected from this disease, have certain high-risk conditions, may be exposed to blood or other infectious body fluids, are household contacts or sex partners of hepatitis B positive people, are clients or workers in certain care facilities, or travel to or work in countries with a high rate of hepatitis B should be immunized.  Haemophilus influenzae type b (Hib) vaccine. A previously unvaccinated person with asplenia or sickle cell disease or having a scheduled splenectomy should receive 1 dose of Hib vaccine. Regardless of previous immunization, a recipient of a hematopoietic stem cell transplant should receive a  3-dose series 6-12 months after her successful transplant. Hib vaccine is not recommended for adults with HIV infection. Preventive Services / Frequency Ages 35 to 4 years  Blood pressure check.** / Every 3-5 years.  Lipid and cholesterol check.** / Every 5 years beginning at age 60.  Clinical breast exam.** / Every 3 years for women in their 71s and 10s.  BRCA-related cancer risk assessment.** / For women who have family members with a BRCA-related cancer (breast, ovarian, tubal, or peritoneal cancers).  Pap test.** / Every 2 years from ages 76 through 26. Every 3 years starting at age 61 through age 76 or 93 with a history of 3 consecutive normal Pap tests.  HPV screening.** / Every 3 years from ages 37 through ages 60 to 51 with a history of 3 consecutive normal Pap tests.  Hepatitis C blood test.** / For any individual with known risks for hepatitis C.  Skin self-exam. / Monthly.  Influenza vaccine. / Every year.  Tetanus, diphtheria, and acellular pertussis (Tdap, Td) vaccine.** / Consult your health care provider. Pregnant women should receive 1 dose of Tdap vaccine during each pregnancy. 1 dose of Td every 10 years.  Varicella vaccine.** / Consult your health care provider. Pregnant females who do not have evidence of immunity should receive the first dose after pregnancy.  HPV vaccine. / 3 doses over 6 months, if 93 and younger. The vaccine is not recommended for use in pregnant females. However, pregnancy testing is not needed before receiving a dose.  Measles, mumps, rubella (MMR) vaccine.** / You need at least 1 dose of MMR if you were born in 1957 or later. You may also need a 2nd dose. For females of childbearing age, rubella immunity should be determined. If there is no evidence of immunity, females who are not pregnant should be vaccinated. If there is no evidence of immunity, females who are  pregnant should delay immunization until after pregnancy.  Pneumococcal  13-valent conjugate (PCV13) vaccine.** / Consult your health care provider.  Pneumococcal polysaccharide (PPSV23) vaccine.** / 1 to 2 doses if you smoke cigarettes or if you have certain conditions.  Meningococcal vaccine.** / 1 dose if you are age 68 to 8 years and a Market researcher living in a residence hall, or have one of several medical conditions, you need to get vaccinated against meningococcal disease. You may also need additional booster doses.  Hepatitis A vaccine.** / Consult your health care provider.  Hepatitis B vaccine.** / Consult your health care provider.  Haemophilus influenzae type b (Hib) vaccine.** / Consult your health care provider. Ages 7 to 53 years  Blood pressure check.** / Every year.  Lipid and cholesterol check.** / Every 5 years beginning at age 25 years.  Lung cancer screening. / Every year if you are aged 11-80 years and have a 30-pack-year history of smoking and currently smoke or have quit within the past 15 years. Yearly screening is stopped once you have quit smoking for at least 15 years or develop a health problem that would prevent you from having lung cancer treatment.  Clinical breast exam.** / Every year after age 48 years.  BRCA-related cancer risk assessment.** / For women who have family members with a BRCA-related cancer (breast, ovarian, tubal, or peritoneal cancers).  Mammogram.** / Every year beginning at age 41 years and continuing for as long as you are in good health. Consult with your health care provider.  Pap test.** / Every 3 years starting at age 65 years through age 37 or 70 years with a history of 3 consecutive normal Pap tests.  HPV screening.** / Every 3 years from ages 72 years through ages 60 to 40 years with a history of 3 consecutive normal Pap tests.  Fecal occult blood test (FOBT) of stool. / Every year beginning at age 21 years and continuing until age 5 years. You may not need to do this test if you get  a colonoscopy every 10 years.  Flexible sigmoidoscopy or colonoscopy.** / Every 5 years for a flexible sigmoidoscopy or every 10 years for a colonoscopy beginning at age 35 years and continuing until age 48 years.  Hepatitis C blood test.** / For all people born from [birth year] through 1965 and any individual with known risks for hepatitis C.  Skin self-exam. / Monthly.  Influenza vaccine. / Every year.  Tetanus, diphtheria, and acellular pertussis (Tdap/Td) vaccine.** / Consult your health care provider. Pregnant women should receive 1 dose of Tdap vaccine during each pregnancy. 1 dose of Td every 10 years.  Varicella vaccine.** / Consult your health care provider. Pregnant females who do not have evidence of immunity should receive the first dose after pregnancy.  Zoster vaccine.** / 1 dose for adults aged 30 years or older.  Measles, mumps, rubella (MMR) vaccine.** / You need at least 1 dose of MMR if you were born in 1957 or later. You may also need a second dose. For females of childbearing age, rubella immunity should be determined. If there is no evidence of immunity, females who are not pregnant should be vaccinated. If there is no evidence of immunity, females who are pregnant should delay immunization until after pregnancy.  Pneumococcal 13-valent conjugate (PCV13) vaccine.** / Consult your health care provider.  Pneumococcal polysaccharide (PPSV23) vaccine.** / 1 to 2 doses if you smoke cigarettes or if you have certain conditions.  Meningococcal vaccine.** /  Consult your health care provider.  Hepatitis A vaccine.** / Consult your health care provider.  Hepatitis B vaccine.** / Consult your health care provider.  Haemophilus influenzae type b (Hib) vaccine.** / Consult your health care provider. Ages 64 years and over  Blood pressure check.** / Every year.  Lipid and cholesterol check.** / Every 5 years beginning at age 23 years.  Lung cancer screening. / Every year if you  are aged 16-80 years and have a 30-pack-year history of smoking and currently smoke or have quit within the past 15 years. Yearly screening is stopped once you have quit smoking for at least 15 years or develop a health problem that would prevent you from having lung cancer treatment.  Clinical breast exam.** / Every year after age 74 years.  BRCA-related cancer risk assessment.** / For women who have family members with a BRCA-related cancer (breast, ovarian, tubal, or peritoneal cancers).  Mammogram.** / Every year beginning at age 44 years and continuing for as long as you are in good health. Consult with your health care provider.  Pap test.** / Every 3 years starting at age 58 years through age 22 or 39 years with 3 consecutive normal Pap tests. Testing can be stopped between 65 and 70 years with 3 consecutive normal Pap tests and no abnormal Pap or HPV tests in the past 10 years.  HPV screening.** / Every 3 years from ages 64 years through ages 70 or 61 years with a history of 3 consecutive normal Pap tests. Testing can be stopped between 65 and 70 years with 3 consecutive normal Pap tests and no abnormal Pap or HPV tests in the past 10 years.  Fecal occult blood test (FOBT) of stool. / Every year beginning at age 40 years and continuing until age 27 years. You may not need to do this test if you get a colonoscopy every 10 years.  Flexible sigmoidoscopy or colonoscopy.** / Every 5 years for a flexible sigmoidoscopy or every 10 years for a colonoscopy beginning at age 7 years and continuing until age 32 years.  Hepatitis C blood test.** / For all people born from [birth year] through 1965 and any individual with known risks for hepatitis C.  Osteoporosis screening.** / A one-time screening for women ages 30 years and over and women at risk for fractures or osteoporosis.  Skin self-exam. / Monthly.  Influenza vaccine. / Every year.  Tetanus, diphtheria, and acellular pertussis (Tdap/Td)  vaccine.** / 1 dose of Td every 10 years.  Varicella vaccine.** / Consult your health care provider.  Zoster vaccine.** / 1 dose for adults aged 35 years or older.  Pneumococcal 13-valent conjugate (PCV13) vaccine.** / Consult your health care provider.  Pneumococcal polysaccharide (PPSV23) vaccine.** / 1 dose for all adults aged 46 years and older.  Meningococcal vaccine.** / Consult your health care provider.  Hepatitis A vaccine.** / Consult your health care provider.  Hepatitis B vaccine.** / Consult your health care provider.  Haemophilus influenzae type b (Hib) vaccine.** / Consult your health care provider. ** Family history and personal history of risk and conditions may change your health care provider's recommendations.   This information is not intended to replace advice given to you by your health care provider. Make sure you discuss any questions you have with your health care provider.   Document Released: 09/08/2001 Document Revised: 08/03/2014 Document Reviewed: 12/08/2010 Elsevier Interactive Patient Education [employer].

## 2015-12-02 NOTE — Progress Notes (Signed)
Subjective:     Teresa Henry is a 50 y.o. female and is here for a comprehensive physical exam. The patient reports no new problems--  hx cardiac arrest last summer.  Social History   Social History  . Marital Status: Married    Spouse Name: N/A  . Number of Children: N/A  . Years of Education: N/A   Occupational History  . Agricultural engineer-- self employed    Social History Main Topics  . Smoking status: Never Smoker   . Smokeless tobacco: Never Used  . Alcohol Use: No  . Drug Use: No  . Sexual Activity:    Partners: Male   Other Topics Concern  . Not on file   Social History Narrative   Health Maintenance  Topic Date Due  . HIV Screening  12/01/2016 (Originally 03/28/1981)  . INFLUENZA VACCINE  02/25/2016  . MAMMOGRAM  10/13/2016  . PAP SMEAR  10/13/2016  . TETANUS/TDAP  04/19/2022    The following portions of the patient's history were reviewed and updated as appropriate:  She  has a past medical history of Frequent episodic tension-type headache; Cardiac arrest ([place]); and Prolonged Q-T interval on ECG. She  does not have any pertinent problems on file. She  has past surgical history that includes Breast biopsy (1999); Cardiac catheterization (N/A, 02/28/2015); and Cardiac catheterization (N/A, 02/28/2015). Her family history includes Arthritis in her father and mother; Heart attack (age of onset: 54) in her brother; Hypertension in her father and mother; Stroke in her maternal grandmother and paternal grandmother. She  reports that she has never smoked. She has never used smokeless tobacco. She reports that she does not drink alcohol or use illicit drugs. She has a current medication list which includes the following prescription(s): aspirin and nadolol. Current Outpatient Prescriptions on File Prior to Visit  Medication Sig Dispense Refill  . aspirin 81 MG tablet Take 81 mg by mouth daily.    . nadolol (CORGARD) 40 MG tablet Take 1.5 tablets (60 mg total) by  mouth 2 (two) times daily. 90 tablet 11   No current facility-administered medications on file prior to visit.   She has No Known Allergies..  Review of Systems Review of Systems  Constitutional: Negative for activity change, appetite change and fatigue.  HENT: Negative for hearing loss, congestion, tinnitus and ear discharge.  dentist q10m Eyes: Negative for visual disturbance (see optho q1y -- vision corrected to 20/20 with glasses).  Respiratory: Negative for cough, chest tightness and shortness of breath.   Cardiovascular: Negative for chest pain, palpitations and leg swelling.  Gastrointestinal: Negative for abdominal pain, diarrhea, constipation and abdominal distention.  Genitourinary: Negative for urgency, frequency, decreased urine volume and difficulty urinating.  Musculoskeletal: Negative for back pain, arthralgias and gait problem.  Skin: Negative for color change, pallor and rash.  Neurological: Negative for dizziness, light-headedness, numbness and headaches.  Hematological: Negative for adenopathy. Does not bruise/bleed easily.  Psychiatric/Behavioral: Negative for suicidal ideas, confusion, sleep disturbance, self-injury, dysphoric mood, decreased concentration and agitation.       Objective:    BP 114/80 mmHg  Pulse 73  Temp(Src) 98.4 F (36.9 C) (Oral)  Ht 5' 2.75" (1.594 m)  Wt 155 lb 9.6 oz (70.58 kg)  BMI 27.78 kg/m2  SpO2 96% General appearance: alert, cooperative, appears stated age and no distress Head: Normocephalic, without obvious abnormality, atraumatic Eyes: negative findings: lids and lashes normal, conjunctivae and sclerae normal and pupils equal, round, reactive to light and accomodation Ears: normal  TM's and external ear canals both ears Nose: Nares normal. Septum midline. Mucosa normal. No drainage or sinus tenderness. Throat: lips, mucosa, and tongue normal; teeth and gums normal Neck: no adenopathy, no carotid bruit, no JVD, supple,  symmetrical, trachea midline and thyroid not enlarged, symmetric, no tenderness/mass/nodules Back: symmetric, no curvature. ROM normal. No CVA tenderness. Lungs: clear to auscultation bilaterally Breasts: gyn Heart: S1, S2 normal Abdomen: soft, non-tender; bowel sounds normal; no masses,  no organomegaly Pelvic: deferred--gyn Extremities: extremities normal, atraumatic, no cyanosis or edema Pulses: 2+ and symmetric Skin: Skin color, texture, turgor normal. No rashes or lesions Lymph nodes: Cervical, supraclavicular, and axillary nodes normal. Neurologic: Alert and oriented X 3, normal strength and tone. Normal symmetric reflexes. Normal coordination and gait Psych--- no depression, no anxiety      Assessment:    Healthy female exam.   Plan:    ghm utd Check labs See After Visit Summary for Counseling Recommendations    1. Preventative health care   - Lipid panel - POCT urinalysis dipstick - TSH - CBC with Differential/Platelet - Comprehensive metabolic panel  2. History of cardiac arrest She needs a new referral due to new insurance  - Ambulatory referral to Cardiology

## 2015-12-10 ENCOUNTER — Encounter: Payer: Self-pay | Admitting: *Deleted

## 2015-12-10 ENCOUNTER — Other Ambulatory Visit: Payer: Self-pay | Admitting: Family Medicine

## 2015-12-10 DIAGNOSIS — D721 Eosinophilia, unspecified: Secondary | ICD-10-CM

## 2015-12-24 ENCOUNTER — Ambulatory Visit (INDEPENDENT_AMBULATORY_CARE_PROVIDER_SITE_OTHER): Payer: 59 | Admitting: *Deleted

## 2015-12-24 DIAGNOSIS — I495 Sick sinus syndrome: Secondary | ICD-10-CM | POA: Diagnosis not present

## 2015-12-24 NOTE — Progress Notes (Signed)
Remote ICD transmission.   

## 2016-01-07 LAB — CUP PACEART REMOTE DEVICE CHECK
Battery Remaining Longevity: 108 mo
Battery Voltage: 3.01 V
Brady Statistic AP VP Percent: 0 %
Brady Statistic AS VS Percent: 22.11 %
Brady Statistic RA Percent Paced: 77.89 %
HIGH POWER IMPEDANCE MEASURED VALUE: 66 Ohm
Implantable Lead Implant Date: 20160804
Implantable Lead Location: 753860
Implantable Lead Model: 5076
Lead Channel Impedance Value: 532 Ohm
Lead Channel Impedance Value: 589 Ohm
Lead Channel Pacing Threshold Amplitude: 0.75 V
Lead Channel Pacing Threshold Pulse Width: 0.4 ms
Lead Channel Pacing Threshold Pulse Width: 0.4 ms
Lead Channel Sensing Intrinsic Amplitude: 9.375 mV
Lead Channel Setting Pacing Amplitude: 1.75 V
Lead Channel Setting Pacing Pulse Width: 0.4 ms
Lead Channel Setting Sensing Sensitivity: 0.3 mV
MDC IDC LEAD IMPLANT DT: 20160804
MDC IDC LEAD LOCATION: 753859
MDC IDC MSMT LEADCHNL RA SENSING INTR AMPL: 1 mV
MDC IDC MSMT LEADCHNL RA SENSING INTR AMPL: 1 mV
MDC IDC MSMT LEADCHNL RV IMPEDANCE VALUE: 475 Ohm
MDC IDC MSMT LEADCHNL RV PACING THRESHOLD AMPLITUDE: 0.625 V
MDC IDC MSMT LEADCHNL RV SENSING INTR AMPL: 9.375 mV
MDC IDC SESS DTM: 20170530155055
MDC IDC SET LEADCHNL RV PACING AMPLITUDE: 2 V
MDC IDC STAT BRADY AP VS PERCENT: 77.89 %
MDC IDC STAT BRADY AS VP PERCENT: 0 %
MDC IDC STAT BRADY RV PERCENT PACED: 0 %

## 2016-01-14 ENCOUNTER — Encounter: Payer: Self-pay | Admitting: Cardiology

## 2016-02-06 ENCOUNTER — Other Ambulatory Visit (INDEPENDENT_AMBULATORY_CARE_PROVIDER_SITE_OTHER): Payer: 59

## 2016-02-06 DIAGNOSIS — D721 Eosinophilia, unspecified: Secondary | ICD-10-CM

## 2016-02-06 LAB — CBC WITH DIFFERENTIAL/PLATELET
BASOS ABS: 0.1 10*3/uL (ref 0.0–0.1)
BASOS PCT: 0.6 % (ref 0.0–3.0)
EOS ABS: 0.6 10*3/uL (ref 0.0–0.7)
Eosinophils Relative: 5.1 % — ABNORMAL HIGH (ref 0.0–5.0)
HCT: 42.8 % (ref 36.0–46.0)
Hemoglobin: 14.1 g/dL (ref 12.0–15.0)
LYMPHS ABS: 3.5 10*3/uL (ref 0.7–4.0)
Lymphocytes Relative: 29.5 % (ref 12.0–46.0)
MCHC: 33 g/dL (ref 30.0–36.0)
MCV: 84 fl (ref 78.0–100.0)
MONOS PCT: 8.1 % (ref 3.0–12.0)
Monocytes Absolute: 1 10*3/uL (ref 0.1–1.0)
NEUTROS ABS: 6.8 10*3/uL (ref 1.4–7.7)
NEUTROS PCT: 56.7 % (ref 43.0–77.0)
PLATELETS: 287 10*3/uL (ref 150.0–400.0)
RBC: 5.09 Mil/uL (ref 3.87–5.11)
RDW: 13.5 % (ref 11.5–15.5)
WBC: 12 10*3/uL — ABNORMAL HIGH (ref 4.0–10.5)

## 2016-02-10 ENCOUNTER — Other Ambulatory Visit: Payer: Self-pay | Admitting: Nurse Practitioner

## 2016-02-18 ENCOUNTER — Ambulatory Visit (INDEPENDENT_AMBULATORY_CARE_PROVIDER_SITE_OTHER): Payer: 59 | Admitting: Medical

## 2016-02-18 ENCOUNTER — Encounter: Payer: Self-pay | Admitting: Medical

## 2016-02-18 VITALS — BP 118/80 | HR 68 | Temp 98.0°F | Ht 62.75 in | Wt 152.0 lb

## 2016-02-18 DIAGNOSIS — J029 Acute pharyngitis, unspecified: Secondary | ICD-10-CM | POA: Diagnosis not present

## 2016-02-18 DIAGNOSIS — R1012 Left upper quadrant pain: Secondary | ICD-10-CM

## 2016-02-18 DIAGNOSIS — D72829 Elevated white blood cell count, unspecified: Secondary | ICD-10-CM

## 2016-02-18 DIAGNOSIS — R509 Fever, unspecified: Secondary | ICD-10-CM

## 2016-02-18 LAB — CBC WITH DIFFERENTIAL/PLATELET
BASOS ABS: 0.1 10*3/uL (ref 0.0–0.1)
Basophils Relative: 0.6 % (ref 0.0–3.0)
EOS PCT: 2.7 % (ref 0.0–5.0)
Eosinophils Absolute: 0.4 10*3/uL (ref 0.0–0.7)
HCT: 41.7 % (ref 36.0–46.0)
Hemoglobin: 14.1 g/dL (ref 12.0–15.0)
LYMPHS ABS: 2.6 10*3/uL (ref 0.7–4.0)
Lymphocytes Relative: 19.1 % (ref 12.0–46.0)
MCHC: 33.8 g/dL (ref 30.0–36.0)
MCV: 83.5 fl (ref 78.0–100.0)
MONO ABS: 1.1 10*3/uL — AB (ref 0.1–1.0)
Monocytes Relative: 8.2 % (ref 3.0–12.0)
NEUTROS ABS: 9.5 10*3/uL — AB (ref 1.4–7.7)
NEUTROS PCT: 69.4 % (ref 43.0–77.0)
PLATELETS: 224 10*3/uL (ref 150.0–400.0)
RBC: 5 Mil/uL (ref 3.87–5.11)
RDW: 14 % (ref 11.5–15.5)
WBC: 13.7 10*3/uL — ABNORMAL HIGH (ref 4.0–10.5)

## 2016-02-18 LAB — COMPREHENSIVE METABOLIC PANEL
ALK PHOS: 112 U/L (ref 39–117)
ALT: 43 U/L — ABNORMAL HIGH (ref 0–35)
AST: 27 U/L (ref 0–37)
Albumin: 4.4 g/dL (ref 3.5–5.2)
BUN: 11 mg/dL (ref 6–23)
CHLORIDE: 101 meq/L (ref 96–112)
CO2: 28 meq/L (ref 19–32)
Calcium: 10.3 mg/dL (ref 8.4–10.5)
Creatinine, Ser: 0.74 mg/dL (ref 0.40–1.20)
GFR: 88.33 mL/min (ref >60.00)
GLUCOSE: 83 mg/dL (ref 70–99)
POTASSIUM: 4.4 meq/L (ref 3.5–5.1)
SODIUM: 136 meq/L (ref 135–145)
TOTAL PROTEIN: 7.8 g/dL (ref 6.0–8.3)
Total Bilirubin: 0.5 mg/dL (ref 0.2–1.2)

## 2016-02-18 LAB — POC URINALSYSI DIPSTICK (AUTOMATED)
Bilirubin, UA: NEGATIVE
Blood, UA: NEGATIVE
Glucose, UA: NEGATIVE
Ketones, UA: NEGATIVE
LEUKOCYTES UA: NEGATIVE
NITRITE UA: NEGATIVE
PH UA: 6
Spec Grav, UA: 1.015
UROBILINOGEN UA: 0.2

## 2016-02-18 LAB — AMYLASE: Amylase: 16 U/L — ABNORMAL LOW (ref 27–131)

## 2016-02-18 LAB — POCT RAPID STREP A (OFFICE): RAPID STREP A SCREEN: NEGATIVE

## 2016-02-18 LAB — LIPASE: Lipase: 31 U/L (ref 11.0–59.0)

## 2016-02-18 MED ORDER — CEPHALEXIN 500 MG PO CAPS: 500.0000 mg | ORAL_CAPSULE | Freq: Two times a day (BID) | ORAL | 0 refills | Status: DC

## 2016-02-18 MED ORDER — FAMCICLOVIR 500 MG PO TABS: 500.0000 mg | ORAL_TABLET | Freq: Three times a day (TID) | ORAL | 0 refills | Status: DC

## 2016-02-18 NOTE — Addendum Note (Signed)
Addended by: Anabel Halon on: 02/18/2016 06:24 PM   Modules accepted: Orders

## 2016-02-18 NOTE — Progress Notes (Signed)
Subjective:    Patient ID: Teresa Henry, female    DOB: [birth year], 50 y.o.   MRN: [id number]  HPI   Pt in for some recent  Described left flank area pain on Sunday am(then pointed more to left upper quadrant). Pt states when she first felt pain was constant dull pain. Last all day. Pain has gradually resolved and the area is still faintly tender. Some pain on laying on left side or on palpation. No rash on pt skin in area of tender. No dysuria, and no increase frequency. No direct cva pain. No fall or injury. On Sunday thought she ached a little all over Sunday evening. Maybe fever as well.    St this am with temp of 99.4. Pt throat still  Hurts currently. Pt has 3 children. None of them have been sick. No exposure real young children. Then states about 10 days ago exposure to 2-3 yo at church.  Pt had mild elevated wbc in the past. Dr. Etter Sjogren had considered referring to hematologist.   Review of Systems  Constitutional: Positive for fever. Negative for chills and fatigue.  HENT: Positive for sore throat. Negative for congestion and drooling.   Respiratory: Negative for cough, chest tightness, shortness of breath and wheezing.   Cardiovascular: Negative for chest pain and palpitations.  Gastrointestinal: Positive for abdominal pain. Negative for abdominal distention, anal bleeding, blood in stool, constipation, diarrhea, nausea, rectal pain and vomiting.  Musculoskeletal: Positive for myalgias. Negative for back pain.       Early on. But not now.  Neurological: Negative for dizziness, seizures, weakness, light-headedness, numbness and headaches.  Hematological: Negative for adenopathy. Does not bruise/bleed easily.    Past Medical History:  Diagnosis Date  . Cardiac arrest [facility])    a. s/p MDT dual chamber ICD  . Frequent episodic tension-type headache   . Prolonged Q-T interval on ECG      Social History   Social History  . Marital status: Married    Spouse name: N/A  .  Number of children: N/A  . Years of education: N/A   Occupational History  . Agricultural engineer-- self employed    Social History Main Topics  . Smoking status: Never Smoker  . Smokeless tobacco: Never Used  . Alcohol use No  . Drug use: No  . Sexual activity: Yes    Partners: Male   Other Topics Concern  . Not on file   Social History Narrative  . No narrative on file    Past Surgical History:  Procedure Laterality Date  . BREAST BIOPSY  1999   Needle Biopsy- Non cancer  . CARDIAC CATHETERIZATION N/A 02/28/2015   Procedure: Left Heart Cath and Coronary Angiography;  Surgeon: Belva Crome, MD;  Location: [place] [facility];  Service: Cardiovascular;  Laterality: N/A;  . EP IMPLANTABLE DEVICE N/A 02/28/2015   MDT dual chamber ICD implanted by Dr Rayann Heman for secondary prevention    Family History  Problem Relation Age of Onset  . Arthritis Mother   . Hypertension Mother   . Arthritis Father   . Hypertension Father   . Stroke Maternal Grandmother   . Stroke Paternal Grandmother   . Heart attack Brother 40    No Known Allergies  Current Outpatient Prescriptions on File Prior to Visit  Medication Sig Dispense Refill  . aspirin 81 MG tablet Take 81 mg by mouth daily.    . nadolol (CORGARD) 40 MG tablet TAKE 1.5 TABLETS (60 MG  TOTAL) BY MOUTH 2 (TWO) TIMES DAILY. 90 tablet 0   No current facility-administered medications on file prior to visit.     BP 118/80 (BP Location: Left Arm, Patient Position: Sitting, Cuff Size: Normal)   Pulse 68   Temp 98 F (36.7 C) (Oral)   Ht 5' 2.75" (1.594 m)   Wt 152 lb (68.9 kg)   SpO2 99%   BMI 27.14 kg/m       Objective:   Physical Exam  General  Mental Status - Alert. General Appearance - Well groomed. Not in acute distress.  Skin Rashes- No Rashes.  HEENT Head- Normal. Ear Auditory Canal - Left- Normal. Right - Normal.Tympanic Membrane- Left- Normal. Right- Normal. Eye Sclera/Conjunctiva- Left- Normal. Right-  Normal. Nose & Sinuses Nasal Mucosa- Left-  Boggy and Congested. Right-  Boggy and  Congested.Bilateral maxillary and frontal sinus pressure. Mouth & Throat Lips: Upper Lip- Normal: no dryness, cracking, pallor, cyanosis, or vesicular eruption. Lower Lip-Normal: no dryness, cracking, pallor, cyanosis or vesicular eruption. Buccal Mucosa- Bilateral- No Aphthous ulcers. Oropharynx- No Discharge or Erythema. Tonsils: Characteristics- Bilateral- moderate bright red Erythema and  Congestion. Size/Enlargement- Bilateral- 1+ enlargement. Discharge- bilateral-None.  Neck Neck- Supple. No Masses. Faint nontender enlarged submandibular lymph nodes.  Chest and Lung Exam Auscultation: Breath Sounds:-Clear even and unlabored.  Cardiovascular Auscultation:Rythm- Regular, rate and rhythm. Murmurs & Other Heart Sounds:Ausculatation of the heart reveal- No Murmurs.  Lymphatic Head & Neck General Head & Neck Lymphatics: Bilateral: Description- No Localized lymphadenopathy.   Abdomen Inspection:-Inspection Normal.  Palpation/Perucssion: Palpation and Percussion of the abdomen reveal- only faint left upper quadrant Tender(no splenomegaly) No Rebound tenderness, No rigidity(Guarding) and No Palpable abdominal masses.  Liver:-Normal.  Spleen:- Normal.  No llq pain  Skin- no rash on her flank or abdomen.,  Back- no cva tenderness.       Assessment & Plan:  For your st and recent reported fever will rx cephalexin antibiotic. Your strep test was negative.  For your abdomen/flank area pain will get cbc, cmp and pancrease enzymes. If your signs and symptoms change or worsen please notify us(knowing how symptoms change may direct further work up. If severe constant type pain then ED evaluation.  Will follow your wbc count. Today depending on level may refer to hematologist as Dr. Etter Sjogren did mention possible referral to hematologist.  We did urine dip today and test showed no indicators of  infection.  Since some features of .abdomen pain/flank region pain area atypical I am making famvir available if you got shingles like rash or pain while on vacation. But would not advise starting presently.  Follow up in 4 days or as needed. (Since going on vacation could see and evaluate if any cause or concern)   Note did remind pt also that when turns 50 yo we need to schedule her for colonosocpy and not delay in getting that done.  Marika Mahaffy, Percell Miller, PA-C

## 2016-02-18 NOTE — Patient Instructions (Addendum)
For your st and recent reported fever will rx cephalexin antibiotic. Your strep test was negative. But can be falsely negative. In light of appearance and other symptoms would start antibiotic,  For your abdomen/flank area pain will get cbc, cmp and pancrease enzymes. If your signs and symptoms change or worsen please notify us(knowing how symptoms change may direct further work up. If severe constant type pain then ED evaluation.  Will follow your wbc count. Today depending on level may refer to hematologist as Dr. Etter Sjogren did mention possible referral to hematologist.  We did urine dip today and test showed no indicators of infection.  Since some features of .abdomen pain/flank region pain area atypical I am making famvir available if you got shingles like rash or pain while on vacation. But would not advise starting presently.  Follow up in 4 days or as needed. (Since going on vacation could see and evaluate if any cause or concern)

## 2016-02-20 ENCOUNTER — Encounter: Payer: Self-pay | Admitting: Internal Medicine

## 2016-02-20 ENCOUNTER — Ambulatory Visit (INDEPENDENT_AMBULATORY_CARE_PROVIDER_SITE_OTHER): Payer: 59 | Admitting: Internal Medicine

## 2016-02-20 ENCOUNTER — Telehealth: Payer: Self-pay | Admitting: Internal Medicine

## 2016-02-20 VITALS — BP 114/80 | HR 70 | Ht 62.5 in | Wt 153.0 lb

## 2016-02-20 DIAGNOSIS — I495 Sick sinus syndrome: Secondary | ICD-10-CM

## 2016-02-20 DIAGNOSIS — I469 Cardiac arrest, cause unspecified: Secondary | ICD-10-CM | POA: Diagnosis not present

## 2016-02-20 DIAGNOSIS — I422 Other hypertrophic cardiomyopathy: Secondary | ICD-10-CM | POA: Diagnosis not present

## 2016-02-20 LAB — CUP PACEART INCLINIC DEVICE CHECK
Battery Voltage: 2.96 V
Brady Statistic RA Percent Paced: 83.97 %
Brady Statistic RV Percent Paced: 0.04 %
Date Time Interrogation Session: 20170727103819
HighPow Impedance: 69 Ohm
Implantable Lead Implant Date: 20160804
Implantable Lead Location: 753860
Implantable Lead Model: 5076
Lead Channel Impedance Value: 475 Ohm
Lead Channel Impedance Value: 608 Ohm
Lead Channel Pacing Threshold Pulse Width: 0.4 ms
Lead Channel Sensing Intrinsic Amplitude: 0.875 mV
Lead Channel Sensing Intrinsic Amplitude: 1.625 mV
Lead Channel Setting Pacing Pulse Width: 0.4 ms
Lead Channel Setting Sensing Sensitivity: 0.3 mV
MDC IDC LEAD IMPLANT DT: 20160804
MDC IDC LEAD LOCATION: 753859
MDC IDC MSMT BATTERY REMAINING LONGEVITY: 105 mo
MDC IDC MSMT LEADCHNL RA IMPEDANCE VALUE: 532 Ohm
MDC IDC MSMT LEADCHNL RA PACING THRESHOLD AMPLITUDE: 1 V
MDC IDC MSMT LEADCHNL RV PACING THRESHOLD AMPLITUDE: 1 V
MDC IDC MSMT LEADCHNL RV PACING THRESHOLD PULSEWIDTH: 0.4 ms
MDC IDC MSMT LEADCHNL RV SENSING INTR AMPL: 9.25 mV
MDC IDC MSMT LEADCHNL RV SENSING INTR AMPL: 9.625 mV
MDC IDC SET LEADCHNL RA PACING AMPLITUDE: 2 V
MDC IDC SET LEADCHNL RV PACING AMPLITUDE: 2 V
MDC IDC STAT BRADY AP VP PERCENT: 0.04 %
MDC IDC STAT BRADY AP VS PERCENT: 83.93 %
MDC IDC STAT BRADY AS VP PERCENT: 0 %
MDC IDC STAT BRADY AS VS PERCENT: 16.03 %

## 2016-02-20 MED ORDER — NADOLOL 40 MG PO TABS: ORAL_TABLET | ORAL | 3 refills | Status: DC

## 2016-02-20 NOTE — Patient Instructions (Signed)
Medication Instructions:  Your physician recommends that you continue on your current medications as directed. Please refer to the Current Medication list given to you today.   Labwork: None ordered None ordered    Your physician wants you to follow-up in: 12 months with Dr Knox Saliva will receive a reminder letter in the mail two months in advance. If you don't receive a letter, please call our office to schedule the follow-up appointment.    Remote monitoring is used to monitor your ICD from home. This monitoring reduces the number of office visits required to check your device to one time per year. It allows [place] to keep an eye on the functioning of your device to ensure it is working properly. You are scheduled for a device check from home on 05/21/16. You may send your transmission at any time that day. If you have a wireless device, the transmission will be sent automatically. After your physician reviews your transmission, you will receive a postcard with your next transmission date.      Any Other Special Instructions Will Be Listed Below (If Applicable).     If you need a refill on your cardiac medications before your next appointment, please call your pharmacy.

## 2016-02-20 NOTE — Telephone Encounter (Signed)
Spoke with patient.  Dr Lovena Le does not know of a correlation between this and her arrest or implantation.  She appreciated my call

## 2016-02-20 NOTE — Telephone Encounter (Signed)
New message     The pt is calling about a elevated white blood cell being elevate from her cardiac asset from last year. The pt forgot to ask the question when she was here.

## 2016-02-20 NOTE — Progress Notes (Signed)
HPI Teresa Henry returns today for followup. She is a 50 yo woman with a h/o syncope who was found to have VF and successfully defibrillated while in the hospital several months ago. Her QT interval was increased and she was given a presumptive diagnosis of long QT. She was noted to have an LV thickness of nearly 16 mm. She also had some sinus bradycardia. She underwent insertion of a DDD ICD and has undergone genetic testing which failed to reveal a QT prolonging gene and most recently no evidence of HCM. In the interim she has adjusted to taking her Nadolol. She has had no recurrent ventricular arrhythmias. She has considered enrolling her family in additional genetic study.  No Known Allergies   Current Outpatient Prescriptions  Medication Sig Dispense Refill  . aspirin 81 MG tablet Take 81 mg by mouth daily.    . cephALEXin (KEFLEX) 500 MG capsule Take 1 capsule (500 mg total) by mouth 2 (two) times daily. 20 capsule 0  . nadolol (CORGARD) 40 MG tablet TAKE 1.5 TABLETS (60 MG TOTAL) BY MOUTH 2 (TWO) TIMES DAILY. 90 tablet 0  . famciclovir (FAMVIR) 500 MG tablet Take 1 tablet (500 mg total) by mouth 3 (three) times daily. (Patient not taking: Reported on 02/20/2016) 21 tablet 0   No current facility-administered medications for this visit.      Past Medical History:  Diagnosis Date  . Cardiac arrest [facility])    a. s/p MDT dual chamber ICD  . Frequent episodic tension-type headache   . Prolonged Q-T interval on ECG     ROS:   All systems reviewed and negative except as noted in the HPI.   Past Surgical History:  Procedure Laterality Date  . BREAST BIOPSY  1999   Needle Biopsy- Non cancer  . CARDIAC CATHETERIZATION N/A 02/28/2015   Procedure: Left Heart Cath and Coronary Angiography;  Surgeon: Belva Crome, MD;  Location: [place] [facility];  Service: Cardiovascular;  Laterality: N/A;  . EP IMPLANTABLE DEVICE N/A 02/28/2015   MDT dual chamber ICD implanted by Dr Rayann Heman for  secondary prevention     Family History  Problem Relation Age of Onset  . Arthritis Mother   . Hypertension Mother   . Arthritis Father   . Hypertension Father   . Stroke Maternal Grandmother   . Stroke Paternal Grandmother   . Heart attack Brother 84     Social History   Social History  . Marital status: Married    Spouse name: N/A  . Number of children: N/A  . Years of education: N/A   Occupational History  . Agricultural engineer-- self employed    Social History Main Topics  . Smoking status: Never Smoker  . Smokeless tobacco: Never Used  . Alcohol use No  . Drug use: No  . Sexual activity: Yes    Partners: Male   Other Topics Concern  . Not on file   Social History Narrative  . No narrative on file     BP 114/80   Pulse 70   Ht 5' 2.5" (1.588 m)   Wt 153 lb (69.4 kg)   BMI 27.54 kg/m   Physical Exam:  Well appearing 50 yo woman, NAD HEENT: Unremarkable Neck:  6 cm JVD, no thyromegally Lymphatics:  No adenopathy Back:  No CVA tenderness Lungs:  Clear with no wheezes HEART:  Regular rate rhythm, no murmurs, no rubs, no clicks Abd:  soft, positive bowel sounds, no  organomegally, no rebound, no guarding Ext:  2 plus pulses, no edema, no cyanosis, no clubbing Skin:  No rashes no nodules Neuro:  CN II through XII intact, motor grossly intact  DEVICE  Normal device function.  See PaceArt for details.   Assess/Plan: 1. VF arrest - she has had no recurrent syncope or ventricular arrhythmias. She will undergo watchful waiting. 2. ICD - her [employer] DDD ICD is working normally. Will recheck in several months. 3. Sinus node dysfunction - unclear if it is related to her arrest. She is pacing 85% of the time. 4. HCM - this diagnosis is uncertain particularly in light of her genetic results. I encouraged her to enroll in additional genetic study if available.  Teresa Henry.D.

## 2016-05-21 ENCOUNTER — Ambulatory Visit (INDEPENDENT_AMBULATORY_CARE_PROVIDER_SITE_OTHER): Payer: 59 | Admitting: *Deleted

## 2016-05-21 ENCOUNTER — Telehealth: Payer: Self-pay | Admitting: Cardiology

## 2016-05-21 DIAGNOSIS — I495 Sick sinus syndrome: Secondary | ICD-10-CM | POA: Diagnosis not present

## 2016-05-21 NOTE — Telephone Encounter (Signed)
Spoke with pt and reminded pt of remote transmission that is due today. Pt verbalized understanding.   

## 2016-05-25 NOTE — Progress Notes (Signed)
Remote ICD transmission.   

## 2016-05-27 ENCOUNTER — Encounter: Payer: Self-pay | Admitting: Cardiology

## 2016-06-05 ENCOUNTER — Telehealth: Payer: Self-pay | Admitting: Internal Medicine

## 2016-06-05 NOTE — Telephone Encounter (Signed)
New Message:   Please call,she have some questions. She is having her daughter evaluated for the same condition she have.

## 2016-06-09 NOTE — Telephone Encounter (Signed)
Called, left msg for pt to call us.

## 2016-06-10 ENCOUNTER — Other Ambulatory Visit (INDEPENDENT_AMBULATORY_CARE_PROVIDER_SITE_OTHER): Payer: 59

## 2016-06-10 DIAGNOSIS — D72829 Elevated white blood cell count, unspecified: Secondary | ICD-10-CM | POA: Diagnosis not present

## 2016-06-10 LAB — CBC WITH DIFFERENTIAL/PLATELET
BASOS PCT: 0.7 % (ref 0.0–3.0)
Basophils Absolute: 0.1 10*3/uL (ref 0.0–0.1)
EOS ABS: 0.5 10*3/uL (ref 0.0–0.7)
Eosinophils Relative: 5.4 % — ABNORMAL HIGH (ref 0.0–5.0)
HEMATOCRIT: 42.8 % (ref 36.0–46.0)
Hemoglobin: 14.6 g/dL (ref 12.0–15.0)
LYMPHS ABS: 3.2 10*3/uL (ref 0.7–4.0)
LYMPHS PCT: 32.4 % (ref 12.0–46.0)
MCHC: 34.1 g/dL (ref 30.0–36.0)
MCV: 84.1 fl (ref 78.0–100.0)
Monocytes Absolute: 1 10*3/uL (ref 0.1–1.0)
Monocytes Relative: 10 % (ref 3.0–12.0)
NEUTROS ABS: 5 10*3/uL (ref 1.4–7.7)
Neutrophils Relative %: 51.5 % (ref 43.0–77.0)
PLATELETS: 250 10*3/uL (ref 150.0–400.0)
RBC: 5.09 Mil/uL (ref 3.87–5.11)
RDW: 12.9 % (ref 11.5–15.5)
WBC: 9.7 10*3/uL (ref 4.0–10.5)

## 2016-06-17 LAB — CUP PACEART REMOTE DEVICE CHECK
Battery Remaining Longevity: 104 mo
Battery Voltage: 2.95 V
Brady Statistic AP VS Percent: 88.91 %
Brady Statistic AS VS Percent: 11.05 %
Brady Statistic RV Percent Paced: 0.04 %
Date Time Interrogation Session: 20171026201126
HIGH POWER IMPEDANCE MEASURED VALUE: 64 Ohm
Implantable Lead Implant Date: 20160804
Implantable Pulse Generator Implant Date: 20160804
Lead Channel Impedance Value: 475 Ohm
Lead Channel Impedance Value: 532 Ohm
Lead Channel Pacing Threshold Amplitude: 0.5 V
Lead Channel Pacing Threshold Amplitude: 0.875 V
Lead Channel Pacing Threshold Pulse Width: 0.4 ms
Lead Channel Pacing Threshold Pulse Width: 0.4 ms
Lead Channel Sensing Intrinsic Amplitude: 0.875 mV
Lead Channel Setting Pacing Amplitude: 2 V
Lead Channel Setting Sensing Sensitivity: 0.3 mV
MDC IDC LEAD IMPLANT DT: 20160804
MDC IDC LEAD LOCATION: 753859
MDC IDC LEAD LOCATION: 753860
MDC IDC MSMT LEADCHNL RA SENSING INTR AMPL: 0.875 mV
MDC IDC MSMT LEADCHNL RV IMPEDANCE VALUE: 589 Ohm
MDC IDC MSMT LEADCHNL RV SENSING INTR AMPL: 9.75 mV
MDC IDC MSMT LEADCHNL RV SENSING INTR AMPL: 9.75 mV
MDC IDC SET LEADCHNL RA PACING AMPLITUDE: 1.75 V
MDC IDC SET LEADCHNL RV PACING PULSEWIDTH: 0.4 ms
MDC IDC STAT BRADY AP VP PERCENT: 0.04 %
MDC IDC STAT BRADY AS VP PERCENT: 0 %
MDC IDC STAT BRADY RA PERCENT PACED: 88.95 %

## 2016-08-20 ENCOUNTER — Ambulatory Visit (INDEPENDENT_AMBULATORY_CARE_PROVIDER_SITE_OTHER): Payer: 59 | Admitting: *Deleted

## 2016-08-20 DIAGNOSIS — R9431 Abnormal electrocardiogram [ECG] [EKG]: Secondary | ICD-10-CM

## 2016-08-20 NOTE — Progress Notes (Signed)
Remote ICD transmission.   

## 2016-08-21 ENCOUNTER — Encounter: Payer: Self-pay | Admitting: Cardiology

## 2016-08-21 ENCOUNTER — Encounter: Payer: Self-pay | Admitting: Internal Medicine

## 2016-08-22 LAB — CUP PACEART REMOTE DEVICE CHECK
Battery Voltage: 3 V
Brady Statistic AP VP Percent: 0.04 %
Brady Statistic AS VP Percent: 0.01 %
Brady Statistic RA Percent Paced: 83.71 %
Brady Statistic RV Percent Paced: 0.04 %
HIGH POWER IMPEDANCE MEASURED VALUE: 68 Ohm
Implantable Lead Implant Date: 20160804
Implantable Lead Location: 753860
Implantable Pulse Generator Implant Date: 20160804
Lead Channel Impedance Value: 418 Ohm
Lead Channel Impedance Value: 532 Ohm
Lead Channel Pacing Threshold Pulse Width: 0.4 ms
Lead Channel Sensing Intrinsic Amplitude: 9 mV
Lead Channel Setting Pacing Amplitude: 1.5 V
Lead Channel Setting Sensing Sensitivity: 0.3 mV
MDC IDC LEAD IMPLANT DT: 20160804
MDC IDC LEAD LOCATION: 753859
MDC IDC MSMT BATTERY REMAINING LONGEVITY: 103 mo
MDC IDC MSMT LEADCHNL RA IMPEDANCE VALUE: 532 Ohm
MDC IDC MSMT LEADCHNL RA PACING THRESHOLD AMPLITUDE: 0.75 V
MDC IDC MSMT LEADCHNL RA SENSING INTR AMPL: 0.75 mV
MDC IDC MSMT LEADCHNL RA SENSING INTR AMPL: 0.75 mV
MDC IDC MSMT LEADCHNL RV PACING THRESHOLD AMPLITUDE: 0.625 V
MDC IDC MSMT LEADCHNL RV PACING THRESHOLD PULSEWIDTH: 0.4 ms
MDC IDC MSMT LEADCHNL RV SENSING INTR AMPL: 9 mV
MDC IDC SESS DTM: 20180125102706
MDC IDC SET LEADCHNL RV PACING AMPLITUDE: 2 V
MDC IDC SET LEADCHNL RV PACING PULSEWIDTH: 0.4 ms
MDC IDC STAT BRADY AP VS PERCENT: 83.69 %
MDC IDC STAT BRADY AS VS PERCENT: 16.27 %

## 2016-11-19 ENCOUNTER — Ambulatory Visit (INDEPENDENT_AMBULATORY_CARE_PROVIDER_SITE_OTHER): Payer: 59 | Admitting: *Deleted

## 2016-11-19 DIAGNOSIS — I469 Cardiac arrest, cause unspecified: Secondary | ICD-10-CM

## 2016-11-19 NOTE — Progress Notes (Signed)
Remote ICD transmission.   

## 2016-11-20 ENCOUNTER — Encounter: Payer: Self-pay | Admitting: Cardiology

## 2016-11-20 LAB — CUP PACEART REMOTE DEVICE CHECK
Battery Voltage: 2.99 V
Brady Statistic AP VP Percent: 0.04 %
Brady Statistic AS VP Percent: 0 %
Brady Statistic AS VS Percent: 11.36 %
Brady Statistic RV Percent Paced: 0.04 %
Date Time Interrogation Session: 20180426113723
HIGH POWER IMPEDANCE MEASURED VALUE: 64 Ohm
Implantable Lead Implant Date: 20160804
Implantable Lead Location: 753859
Implantable Lead Location: 753860
Implantable Pulse Generator Implant Date: 20160804
Lead Channel Impedance Value: 456 Ohm
Lead Channel Impedance Value: 532 Ohm
Lead Channel Pacing Threshold Pulse Width: 0.4 ms
Lead Channel Sensing Intrinsic Amplitude: 8.375 mV
Lead Channel Sensing Intrinsic Amplitude: 8.375 mV
Lead Channel Setting Pacing Amplitude: 1.5 V
Lead Channel Setting Pacing Amplitude: 2 V
Lead Channel Setting Pacing Pulse Width: 0.4 ms
Lead Channel Setting Sensing Sensitivity: 0.3 mV
MDC IDC LEAD IMPLANT DT: 20160804
MDC IDC MSMT BATTERY REMAINING LONGEVITY: 99 mo
MDC IDC MSMT LEADCHNL RA IMPEDANCE VALUE: 475 Ohm
MDC IDC MSMT LEADCHNL RA PACING THRESHOLD AMPLITUDE: 0.625 V
MDC IDC MSMT LEADCHNL RA SENSING INTR AMPL: 1.5 mV
MDC IDC MSMT LEADCHNL RA SENSING INTR AMPL: 1.5 mV
MDC IDC MSMT LEADCHNL RV PACING THRESHOLD AMPLITUDE: 0.625 V
MDC IDC MSMT LEADCHNL RV PACING THRESHOLD PULSEWIDTH: 0.4 ms
MDC IDC STAT BRADY AP VS PERCENT: 88.6 %
MDC IDC STAT BRADY RA PERCENT PACED: 88.62 %

## 2016-12-04 ENCOUNTER — Ambulatory Visit (INDEPENDENT_AMBULATORY_CARE_PROVIDER_SITE_OTHER): Payer: 59 | Admitting: Family Medicine

## 2016-12-04 ENCOUNTER — Encounter: Payer: Self-pay | Admitting: Cardiology

## 2016-12-04 VITALS — BP 110/72 | HR 85 | Temp 98.1°F | Ht 62.75 in | Wt 151.4 lb

## 2016-12-04 DIAGNOSIS — Z0000 Encounter for general adult medical examination without abnormal findings: Secondary | ICD-10-CM | POA: Diagnosis not present

## 2016-12-04 DIAGNOSIS — I422 Other hypertrophic cardiomyopathy: Secondary | ICD-10-CM

## 2016-12-04 LAB — CBC WITH DIFFERENTIAL/PLATELET
BASOS PCT: 1.2 % (ref 0.0–3.0)
Basophils Absolute: 0.1 10*3/uL (ref 0.0–0.1)
EOS PCT: 3.5 % (ref 0.0–5.0)
Eosinophils Absolute: 0.3 10*3/uL (ref 0.0–0.7)
HEMATOCRIT: 43.3 % (ref 36.0–46.0)
HEMOGLOBIN: 14.8 g/dL (ref 12.0–15.0)
LYMPHS PCT: 30.8 % (ref 12.0–46.0)
Lymphs Abs: 2.3 10*3/uL (ref 0.7–4.0)
MCHC: 34.1 g/dL (ref 30.0–36.0)
MCV: 85.9 fl (ref 78.0–100.0)
Monocytes Absolute: 0.6 10*3/uL (ref 0.1–1.0)
Monocytes Relative: 7.4 % (ref 3.0–12.0)
NEUTROS ABS: 4.3 10*3/uL (ref 1.4–7.7)
Neutrophils Relative %: 57.1 % (ref 43.0–77.0)
PLATELETS: 284 10*3/uL (ref 150.0–400.0)
RBC: 5.04 Mil/uL (ref 3.87–5.11)
RDW: 12.7 % (ref 11.5–15.5)
WBC: 7.5 10*3/uL (ref 4.0–10.5)

## 2016-12-04 LAB — LIPID PANEL
CHOLESTEROL: 178 mg/dL (ref 0–200)
HDL: 43.1 mg/dL (ref >39.00)
LDL Cholesterol: 109 mg/dL — ABNORMAL HIGH (ref 0–99)
NonHDL: 134.97
Total CHOL/HDL Ratio: 4
Triglycerides: 130 mg/dL (ref 0.0–149.0)
VLDL: 26 mg/dL (ref 0.0–40.0)

## 2016-12-04 LAB — COMPREHENSIVE METABOLIC PANEL
ALK PHOS: 74 U/L (ref 39–117)
ALT: 32 U/L (ref 0–35)
AST: 27 U/L (ref 0–37)
Albumin: 4.7 g/dL (ref 3.5–5.2)
BILIRUBIN TOTAL: 0.4 mg/dL (ref 0.2–1.2)
BUN: 11 mg/dL (ref 6–23)
CO2: 27 mEq/L (ref 19–32)
CREATININE: 0.81 mg/dL (ref 0.40–1.20)
Calcium: 10.1 mg/dL (ref 8.4–10.5)
Chloride: 103 mEq/L (ref 96–112)
GFR: 79.33 mL/min (ref >60.00)
GLUCOSE: 96 mg/dL (ref 70–99)
Potassium: 4.3 mEq/L (ref 3.5–5.1)
SODIUM: 139 meq/L (ref 135–145)
TOTAL PROTEIN: 7.5 g/dL (ref 6.0–8.3)

## 2016-12-04 LAB — POC URINALSYSI DIPSTICK (AUTOMATED)
BILIRUBIN UA: NEGATIVE
Glucose, UA: NEGATIVE
KETONES UA: NEGATIVE
Leukocytes, UA: NEGATIVE
Nitrite, UA: NEGATIVE
PH UA: 6 (ref 5.0–8.0)
Protein, UA: NEGATIVE
RBC UA: NEGATIVE
Spec Grav, UA: 1.025 (ref 1.010–1.025)
Urobilinogen, UA: NEGATIVE E.U./dL — AB

## 2016-12-04 LAB — TSH: TSH: 0.81 u[IU]/mL (ref 0.35–4.50)

## 2016-12-04 NOTE — Progress Notes (Signed)
Subjective:     Teresa Henry is a 51 y.o. female and is here for a comprehensive physical exam. The patient reports no problems.  Social History   Social History  . Marital status: Married    Spouse name: N/A  . Number of children: N/A  . Years of education: N/A   Occupational History  . Agricultural engineer-- self employed    Social History Main Topics  . Smoking status: Never Smoker  . Smokeless tobacco: Never Used  . Alcohol use No  . Drug use: No  . Sexual activity: Yes    Partners: Male   Other Topics Concern  . Not on file   Social History Narrative  . No narrative on file   Health Maintenance  Topic Date Due  . COLONOSCOPY  03/28/2016  . MAMMOGRAM  10/13/2016  . PAP SMEAR  10/13/2016  . INFLUENZA VACCINE  02/24/2017  . TETANUS/TDAP  04/19/2022  . HIV Screening  Completed    The following portions of the patient's history were reviewed and updated as appropriate:  She  has a past medical history of Cardiac arrest ([place]); Frequent episodic tension-type headache; and Prolonged Q-T interval on ECG. She  does not have any pertinent problems on file. She  has a past surgical history that includes Breast biopsy (1999); Cardiac catheterization (N/A, 02/28/2015); and Cardiac catheterization (N/A, 02/28/2015). Her family history includes Arthritis in her father and mother; Heart attack (age of onset: 54) in her brother; Hypertension in her father and mother; Stroke in her maternal grandmother and paternal grandmother. She  reports that she has never smoked. She has never used smokeless tobacco. She reports that she does not drink alcohol or use drugs. She has a current medication list which includes the following prescription(s): aspirin and nadolol. Current Outpatient Prescriptions on File Prior to Visit  Medication Sig Dispense Refill  . aspirin 81 MG tablet Take 81 mg by mouth daily.    . nadolol (CORGARD) 40 MG tablet TAKE 1.5 TABLETS (60 MG TOTAL) BY MOUTH 2 (TWO)  TIMES DAILY. 270 tablet 3   No current facility-administered medications on file prior to visit.    She has No Known Allergies..  Review of Systems Review of Systems  Constitutional: Negative for activity change, appetite change and fatigue.  HENT: Negative for hearing loss, congestion, tinnitus and ear discharge.  dentist q45m Eyes: Negative for visual disturbance (see optho q1y -- vision corrected to 20/20 with glasses).  Respiratory: Negative for cough, chest tightness and shortness of breath.   Cardiovascular: Negative for chest pain, palpitations and leg swelling.  Gastrointestinal: Negative for abdominal pain, diarrhea, constipation and abdominal distention.  Genitourinary: Negative for urgency, frequency, decreased urine volume and difficulty urinating.  Musculoskeletal: Negative for back pain, arthralgias and gait problem.  Skin: Negative for color change, pallor and rash.  Neurological: Negative for dizziness, light-headedness, numbness and headaches.  Hematological: Negative for adenopathy. Does not bruise/bleed easily.  Psychiatric/Behavioral: Negative for suicidal ideas, confusion, sleep disturbance, self-injury, dysphoric mood, decreased concentration and agitation.       Objective:    BP 110/72   Pulse 85   Temp 98.1 F (36.7 C) (Oral)   Ht 5' 2.75" (1.594 m)   Wt 151 lb 6.4 oz (68.7 kg)   LMP 11/23/2016   SpO2 99%   BMI 27.03 kg/m  General appearance: alert, cooperative, appears stated age and no distress Head: Normocephalic, without obvious abnormality, atraumatic Eyes: conjunctivae/corneas clear. PERRL, EOM's intact. Fundi benign. Ears:  normal TM's and external ear canals both ears Nose: Nares normal. Septum midline. Mucosa normal. No drainage or sinus tenderness. Throat: lips, mucosa, and tongue normal; teeth and gums normal Neck: no adenopathy, no carotid bruit, no JVD, supple, symmetrical, trachea midline and thyroid not enlarged, symmetric, no  tenderness/mass/nodules Back: symmetric, no curvature. ROM normal. No CVA tenderness. Lungs: clear to auscultation bilaterally Breasts: gyn Heart: regular rate and rhythm, S1, S2 normal, no murmur, click, rub or gallop Abdomen: soft, non-tender; bowel sounds normal; no masses,  no organomegaly Pelvic: deferred Extremities: extremities normal, atraumatic, no cyanosis or edema Pulses: 2+ and symmetric Skin: Skin color, texture, turgor normal. No rashes or lesions Lymph nodes: Cervical, supraclavicular, and axillary nodes normal. Neurologic: Alert and oriented X 3, normal strength and tone. Normal symmetric reflexes. Normal coordination and gait    Assessment:    Healthy female exam.      Plan:    ghm utd Check labs See After Visit Summary for Counseling Recommendations    1. Preventative health care See above - Ambulatory referral to Gastroenterology - CBC with Differential/Platelet - Lipid panel - TSH - Comprehensive metabolic panel - POCT Urinalysis Dipstick (Automated)  2. Other hypertrophic cardiomyopathy [facility]) Per cardiology

## 2016-12-04 NOTE — Patient Instructions (Signed)

## 2016-12-06 ENCOUNTER — Encounter: Payer: Self-pay | Admitting: Family Medicine

## 2016-12-06 NOTE — Assessment & Plan Note (Signed)
Per cardiology 

## 2017-01-04 ENCOUNTER — Encounter: Payer: Self-pay | Admitting: Family Medicine

## 2017-02-03 ENCOUNTER — Other Ambulatory Visit: Payer: Self-pay | Admitting: Internal Medicine

## 2017-02-09 ENCOUNTER — Encounter: Payer: Self-pay | Admitting: *Deleted

## 2017-02-18 ENCOUNTER — Ambulatory Visit (INDEPENDENT_AMBULATORY_CARE_PROVIDER_SITE_OTHER): Payer: 59 | Admitting: *Deleted

## 2017-02-18 DIAGNOSIS — I469 Cardiac arrest, cause unspecified: Secondary | ICD-10-CM | POA: Diagnosis not present

## 2017-02-18 NOTE — Progress Notes (Signed)
Remote ICD transmission.   

## 2017-02-19 ENCOUNTER — Encounter: Payer: Self-pay | Admitting: Cardiology

## 2017-02-23 ENCOUNTER — Encounter: Payer: Self-pay | Admitting: Internal Medicine

## 2017-02-23 ENCOUNTER — Ambulatory Visit (INDEPENDENT_AMBULATORY_CARE_PROVIDER_SITE_OTHER): Payer: 59 | Admitting: Internal Medicine

## 2017-02-23 VITALS — BP 116/74 | HR 74 | Ht 63.0 in | Wt 153.6 lb

## 2017-02-23 DIAGNOSIS — I422 Other hypertrophic cardiomyopathy: Secondary | ICD-10-CM | POA: Diagnosis not present

## 2017-02-23 DIAGNOSIS — I469 Cardiac arrest, cause unspecified: Secondary | ICD-10-CM

## 2017-02-23 DIAGNOSIS — I495 Sick sinus syndrome: Secondary | ICD-10-CM

## 2017-02-23 NOTE — Patient Instructions (Signed)
Medication Instructions:  Your physician recommends that you continue on your current medications as directed. Please refer to the Current Medication list given to you today.   Labwork: None ordered.   Testing/Procedures: None ordered.   Follow-Up: Your physician wants you to follow-up in: one year with Dr. Lovena Le.   You will receive a reminder letter in the mail two months in advance. If you don't receive a letter, please call our office to schedule the follow-up appointment.  Remote monitoring is used to monitor your Pacemaker from home. This monitoring reduces the number of office visits required to check your device to one time per year. It allows [place] to keep an eye on the functioning of your device to ensure it is working properly. You are scheduled for a device check from home on 05/20/2017. You may send your transmission at any time that day. If you have a wireless device, the transmission will be sent automatically. After your physician reviews your transmission, you will receive a postcard with your next transmission date.    Any Other Special Instructions Will Be Listed Below (If Applicable).     If you need a refill on your cardiac medications before your next appointment, please call your pharmacy.

## 2017-02-23 NOTE — Progress Notes (Signed)
HPI Mrs. Onofre returns today for followup. She is a 52 yo woman with a h/o syncope who was found to have VF and successfully defibrillated while in the hospital over a year ago. Her QT interval was increased and she was given a presumptive diagnosis of long QT. She was noted to have an LV thickness of nearly 16 mm. She also had some sinus bradycardia. She underwent insertion of a DDD ICD and has undergone genetic testing which failed to reveal a QT prolonging gene and most recently no evidence of HCM. In the interim she has done well. She is exercising regularly although she is a bit frustrated by her difficulty losing weight.     No Known Allergies   Current Outpatient Prescriptions  Medication Sig Dispense Refill  . aspirin 81 MG tablet Take 81 mg by mouth daily.    . nadolol (CORGARD) 40 MG tablet TAKE ONE AND ONE-HALF TABLETS (60 MG TOTAL) BY MOUTH 2 (TWO) TIMES DAILY. 270 tablet 0   No current facility-administered medications for this visit.      Past Medical History:  Diagnosis Date  . Cardiac arrest [facility])    a. s/p MDT dual chamber ICD  . Frequent episodic tension-type headache   . Prolonged Q-T interval on ECG     ROS:   All systems reviewed and negative except as noted in the HPI.   Past Surgical History:  Procedure Laterality Date  . BREAST BIOPSY  1999   Needle Biopsy- Non cancer  . CARDIAC CATHETERIZATION N/A 02/28/2015   Procedure: Left Heart Cath and Coronary Angiography;  Surgeon: Belva Crome, MD;  Location: [place] [facility];  Service: Cardiovascular;  Laterality: N/A;  . EP IMPLANTABLE DEVICE N/A 02/28/2015   MDT dual chamber ICD implanted by Dr Rayann Heman for secondary prevention     Family History  Problem Relation Age of Onset  . Arthritis Mother   . Hypertension Mother   . Arthritis Father   . Hypertension Father   . Heart attack Brother 51  . Stroke Maternal Grandmother   . Stroke Paternal Grandmother      Social History   Social  History  . Marital status: Married    Spouse name: N/A  . Number of children: N/A  . Years of education: N/A   Occupational History  . Agricultural engineer-- self employed    Social History Main Topics  . Smoking status: Never Smoker  . Smokeless tobacco: Never Used  . Alcohol use No  . Drug use: No  . Sexual activity: Yes    Partners: Male   Other Topics Concern  . Not on file   Social History Narrative  . No narrative on file     BP 116/74   Pulse 74   Ht 5\' 3"  (1.6 m)   Wt 153 lb 9.6 oz (69.7 kg)   SpO2 96%   BMI 27.21 kg/m   Physical Exam:  Well appearing middle aged woman, NAD HEENT: Unremarkable Neck:  6 cm JVD, no thyromegally Lymphatics:  No adenopathy Back:  No CVA tenderness Lungs:  Clear with no wheezes HEART:  Regular rate rhythm, no murmurs, no rubs, no clicks Abd:  soft, positive bowel sounds, no organomegally, no rebound, no guarding Ext:  2 plus pulses, no edema, no cyanosis, no clubbing Skin:  No rashes no nodules Neuro:  CN II through XII intact, motor grossly intact   DEVICE  Normal device function.  See PaceArt for  details.   Assess/Plan: 1. VF - she is doing well with no recurrent arrhythmias. She will continue her nadolol. 2. ICD - her medtronic DDD ICD is working normally. We have turned her lower rate down to 60/min. 3. Sinus node dysfunction - we have turned down her PPM to 60/min. She has rate response turned on.   Mikle Bosworth.D.

## 2017-03-03 LAB — CUP PACEART INCLINIC DEVICE CHECK
Battery Voltage: 2.96 V
Brady Statistic AP VP Percent: 0.04 %
Brady Statistic AS VP Percent: 0 %
Brady Statistic RA Percent Paced: 87.69 %
Date Time Interrogation Session: 20180731181701
HIGH POWER IMPEDANCE MEASURED VALUE: 64 Ohm
Implantable Lead Implant Date: 20160804
Implantable Lead Implant Date: 20160804
Implantable Lead Location: 753859
Implantable Lead Location: 753860
Implantable Lead Model: 5076
Implantable Pulse Generator Implant Date: 20160804
Lead Channel Impedance Value: 418 Ohm
Lead Channel Pacing Threshold Amplitude: 0.75 V
Lead Channel Pacing Threshold Pulse Width: 0.4 ms
Lead Channel Sensing Intrinsic Amplitude: 9.375 mV
Lead Channel Setting Pacing Amplitude: 1.5 V
Lead Channel Setting Pacing Pulse Width: 0.4 ms
Lead Channel Setting Sensing Sensitivity: 0.3 mV
MDC IDC MSMT BATTERY REMAINING LONGEVITY: 96 mo
MDC IDC MSMT LEADCHNL RA IMPEDANCE VALUE: 513 Ohm
MDC IDC MSMT LEADCHNL RA SENSING INTR AMPL: 3.375 mV
MDC IDC MSMT LEADCHNL RV IMPEDANCE VALUE: 513 Ohm
MDC IDC MSMT LEADCHNL RV PACING THRESHOLD AMPLITUDE: 0.75 V
MDC IDC MSMT LEADCHNL RV PACING THRESHOLD PULSEWIDTH: 0.4 ms
MDC IDC SET LEADCHNL RV PACING AMPLITUDE: 2 V
MDC IDC STAT BRADY AP VS PERCENT: 87.67 %
MDC IDC STAT BRADY AS VS PERCENT: 12.29 %
MDC IDC STAT BRADY RV PERCENT PACED: 0.04 %

## 2017-03-15 ENCOUNTER — Encounter: Payer: Self-pay | Admitting: Family Medicine

## 2017-03-16 NOTE — Telephone Encounter (Signed)
Ok -- great Teresa Henry-- please make sure she has an appointment

## 2017-03-17 ENCOUNTER — Encounter: Payer: Self-pay | Admitting: Internal Medicine

## 2017-03-17 ENCOUNTER — Ambulatory Visit (INDEPENDENT_AMBULATORY_CARE_PROVIDER_SITE_OTHER): Payer: 59 | Admitting: Internal Medicine

## 2017-03-17 VITALS — BP 116/68 | HR 68 | Temp 98.1°F | Resp 14 | Ht 63.0 in | Wt 150.2 lb

## 2017-03-17 DIAGNOSIS — S81852A Open bite, left lower leg, initial encounter: Secondary | ICD-10-CM

## 2017-03-17 DIAGNOSIS — W540XXA Bitten by dog, initial encounter: Secondary | ICD-10-CM

## 2017-03-17 LAB — CUP PACEART REMOTE DEVICE CHECK
Battery Remaining Longevity: 96 mo
Brady Statistic AP VP Percent: 0.04 %
Brady Statistic AS VP Percent: 0 %
Brady Statistic AS VS Percent: 10.48 %
HighPow Impedance: 66 Ohm
Implantable Lead Implant Date: 20160804
Implantable Lead Location: 753859
Lead Channel Impedance Value: 513 Ohm
Lead Channel Pacing Threshold Amplitude: 0.75 V
Lead Channel Pacing Threshold Amplitude: 0.75 V
Lead Channel Pacing Threshold Pulse Width: 0.4 ms
Lead Channel Sensing Intrinsic Amplitude: 1.5 mV
Lead Channel Sensing Intrinsic Amplitude: 8.875 mV
Lead Channel Sensing Intrinsic Amplitude: 8.875 mV
Lead Channel Setting Pacing Amplitude: 1.5 V
Lead Channel Setting Pacing Amplitude: 2 V
Lead Channel Setting Pacing Pulse Width: 0.4 ms
Lead Channel Setting Sensing Sensitivity: 0.3 mV
MDC IDC LEAD IMPLANT DT: 20160804
MDC IDC LEAD LOCATION: 753860
MDC IDC MSMT BATTERY VOLTAGE: 2.99 V
MDC IDC MSMT LEADCHNL RA PACING THRESHOLD PULSEWIDTH: 0.4 ms
MDC IDC MSMT LEADCHNL RA SENSING INTR AMPL: 1.5 mV
MDC IDC MSMT LEADCHNL RV IMPEDANCE VALUE: 418 Ohm
MDC IDC MSMT LEADCHNL RV IMPEDANCE VALUE: 513 Ohm
MDC IDC PG IMPLANT DT: 20160804
MDC IDC SESS DTM: 20180726113728
MDC IDC STAT BRADY AP VS PERCENT: 89.48 %
MDC IDC STAT BRADY RA PERCENT PACED: 89.49 %
MDC IDC STAT BRADY RV PERCENT PACED: 0.04 %

## 2017-03-17 NOTE — Progress Notes (Signed)
Subjective:    Patient ID: Teresa Henry, female    DOB: 1966/04/15, 51 y.o.   MRN: [id number]  DOS:  03/17/2017 Type of visit - description : acute Interval history: 2 days ago, was running through her neighborhood, suddenly a dog started following her by her on bite her left leg. The dog otherwise appeared normal. She reports no fever, chills. No discharge.    Review of Systems   Past Medical History:  Diagnosis Date  . Cardiac arrest [facility])    a. s/p MDT dual chamber ICD  . Frequent episodic tension-type headache   . Prolonged Q-T interval on ECG     Past Surgical History:  Procedure Laterality Date  . BREAST BIOPSY  1999   Needle Biopsy- Non cancer  . CARDIAC CATHETERIZATION N/A 02/28/2015   Procedure: Left Heart Cath and Coronary Angiography;  Surgeon: Belva Crome, MD;  Location: [place] [facility];  Service: Cardiovascular;  Laterality: N/A;  . EP IMPLANTABLE DEVICE N/A 02/28/2015   MDT dual chamber ICD implanted by Dr Rayann Heman for secondary prevention    Social History   Social History  . Marital status: Married    Spouse name: N/A  . Number of children: N/A  . Years of education: N/A   Occupational History  . Agricultural engineer-- self employed    Social History Main Topics  . Smoking status: Never Smoker  . Smokeless tobacco: Never Used  . Alcohol use No  . Drug use: No  . Sexual activity: Yes    Partners: Male   Other Topics Concern  . Not on file   Social History Narrative  . No narrative on file      Allergies as of 03/17/2017   No Known Allergies     Medication List       Accurate as of 03/17/17  3:34 PM. Always use your most recent med list.          aspirin 81 MG tablet Take 81 mg by mouth daily.   nadolol 40 MG tablet Commonly known as:  CORGARD TAKE ONE AND ONE-HALF TABLETS (60 MG TOTAL) BY MOUTH 2 (TWO) TIMES DAILY.          Objective:   Physical Exam BP 116/68 (BP Location: Left Arm, Patient Position: Sitting, Cuff  Size: Small)   Pulse 68   Temp 98.1 F (36.7 C) (Oral)   Resp 14   Ht 5\' 3"  (1.6 m)   Wt 150 lb 4 oz (68.2 kg)   SpO2 94%   BMI 26.62 kg/m  General:   Well developed, well nourished . NAD.  HEENT:  Normocephalic . Face symmetric, atraumatic Skin: See picture. Wound  seems to be healing well, there is no fluctuance, warmness, redness. Neurologic:  alert & oriented X3.  Speech normal, gait appropriate for age and unassisted Psych--  Cognition and judgment appear intact.  Cooperative with normal attention span and concentration.  Behavior appropriate. No anxious or depressed appearing.        Assessment & Plan:  51 year old female with history of hypertrophic cardiomyopathy, torsades de pointes, sinus node dysfunction presents with the following: Dog bite:  Discussed the patient the risk of infection and rabies.  -At this point, there is no evidence of infection, consequently will observe the area and start antibiotics immediately if she has redness or swelling. See AVS -The dog is now quarantine, dog had a rabies shot before but was not updated. Recommend to frequently check with animal  control to be sure the animal  is behaving normal. If not, needs to seek immediate attention for a rabies shot series. -Tetanus shot is up-to-date.

## 2017-03-17 NOTE — Patient Instructions (Addendum)
Please keep a close eye on the area of the bite. If he started looking swollen, red or you see any discharge, call the office immediately for antibiotics  Please call animal control everi 1 or 2 days to be sure the dog is  okay.

## 2017-03-17 NOTE — Progress Notes (Signed)
Pre visit review using our clinic review tool, if applicable. No additional management support is needed unless otherwise documented below in the visit note. 

## 2017-03-18 ENCOUNTER — Ambulatory Visit: Payer: Self-pay | Admitting: Family Medicine

## 2017-03-22 NOTE — Telephone Encounter (Signed)
Pt was seen by Dr. Larose Kells on 03/17/2017.

## 2017-05-02 ENCOUNTER — Other Ambulatory Visit: Payer: Self-pay | Admitting: Internal Medicine

## 2017-05-20 ENCOUNTER — Ambulatory Visit (INDEPENDENT_AMBULATORY_CARE_PROVIDER_SITE_OTHER): Payer: 59 | Admitting: *Deleted

## 2017-05-20 ENCOUNTER — Telehealth: Payer: Self-pay | Admitting: Cardiology

## 2017-05-20 DIAGNOSIS — I469 Cardiac arrest, cause unspecified: Secondary | ICD-10-CM | POA: Diagnosis not present

## 2017-05-20 NOTE — Telephone Encounter (Signed)
Spoke with pt and reminded pt of remote transmission that is due today. Pt verbalized understanding.   

## 2017-05-20 NOTE — Progress Notes (Signed)
Remote ICD transmission.   

## 2017-05-21 ENCOUNTER — Encounter: Payer: Self-pay | Admitting: Cardiology

## 2017-05-21 LAB — CUP PACEART REMOTE DEVICE CHECK
Battery Remaining Longevity: 93 mo
Brady Statistic AP VS Percent: 59.21 %
Brady Statistic AS VP Percent: 0.02 %
Brady Statistic AS VS Percent: 40.74 %
Brady Statistic RV Percent Paced: 0.05 %
Date Time Interrogation Session: 20181025143825
HighPow Impedance: 63 Ohm
Implantable Lead Implant Date: 20160804
Implantable Lead Location: 753860
Implantable Lead Model: 5076
Lead Channel Impedance Value: 456 Ohm
Lead Channel Impedance Value: 513 Ohm
Lead Channel Impedance Value: 551 Ohm
Lead Channel Pacing Threshold Pulse Width: 0.4 ms
Lead Channel Sensing Intrinsic Amplitude: 1.125 mV
Lead Channel Sensing Intrinsic Amplitude: 8.375 mV
Lead Channel Setting Pacing Amplitude: 1.5 V
Lead Channel Setting Sensing Sensitivity: 0.3 mV
MDC IDC LEAD IMPLANT DT: 20160804
MDC IDC LEAD LOCATION: 753859
MDC IDC MSMT BATTERY VOLTAGE: 2.99 V
MDC IDC MSMT LEADCHNL RA PACING THRESHOLD AMPLITUDE: 0.625 V
MDC IDC MSMT LEADCHNL RA SENSING INTR AMPL: 1.125 mV
MDC IDC MSMT LEADCHNL RV PACING THRESHOLD AMPLITUDE: 0.75 V
MDC IDC MSMT LEADCHNL RV PACING THRESHOLD PULSEWIDTH: 0.4 ms
MDC IDC MSMT LEADCHNL RV SENSING INTR AMPL: 8.375 mV
MDC IDC PG IMPLANT DT: 20160804
MDC IDC SET LEADCHNL RV PACING AMPLITUDE: 2 V
MDC IDC SET LEADCHNL RV PACING PULSEWIDTH: 0.4 ms
MDC IDC STAT BRADY AP VP PERCENT: 0.03 %
MDC IDC STAT BRADY RA PERCENT PACED: 59.2 %

## 2017-08-12 ENCOUNTER — Encounter: Payer: Self-pay | Admitting: Internal Medicine

## 2017-08-19 ENCOUNTER — Ambulatory Visit (INDEPENDENT_AMBULATORY_CARE_PROVIDER_SITE_OTHER): Payer: 59 | Admitting: *Deleted

## 2017-08-19 DIAGNOSIS — I469 Cardiac arrest, cause unspecified: Secondary | ICD-10-CM | POA: Diagnosis not present

## 2017-08-19 NOTE — Progress Notes (Signed)
Remote ICD transmission.   

## 2017-08-20 ENCOUNTER — Encounter: Payer: Self-pay | Admitting: Cardiology

## 2017-09-03 ENCOUNTER — Telehealth: Payer: Self-pay | Admitting: Family Medicine

## 2017-09-03 NOTE — Telephone Encounter (Signed)
Spoke with [facility] to have referral back dated patient must call back to have claim resubmitted on referral number: [id number]. Call reference number is [id number]. Called patient to update, sent info to [facility]  Copied from [place] [phone]. Topic: Referral - Question >> Sep 01, 2017 11:19 AM Arletha Grippe wrote: Reason for CRM: pt called - stating that [facility] 02/23/17 for her cardiology visit was not paid.  Pt has [employer].  Pt is stating the insurance told her that the referral was not put in for this date of service.  Cb# [phone]   >> Sep 03, 2017  2:55 PM Tye [place] wrote: Pt called to inform about the referral: July 31st 2018, cardiologist submitted claim b/c referral was denied, the insurance company states that Dr. Ivy Lynn office can call this number  [phone]   And the reference/claim [id number]  Put the referral on file w/ this claim number

## 2017-09-07 LAB — CUP PACEART REMOTE DEVICE CHECK
Battery Voltage: 2.99 V
Brady Statistic AP VP Percent: 0.03 %
Brady Statistic AS VS Percent: 47.33 %
Brady Statistic RA Percent Paced: 52.64 %
Brady Statistic RV Percent Paced: 0.05 %
HIGH POWER IMPEDANCE MEASURED VALUE: 70 Ohm
Implantable Lead Implant Date: 20160804
Implantable Lead Implant Date: 20160804
Implantable Lead Location: 753859
Implantable Lead Model: 5076
Implantable Pulse Generator Implant Date: 20160804
Lead Channel Impedance Value: 399 Ohm
Lead Channel Impedance Value: 475 Ohm
Lead Channel Pacing Threshold Pulse Width: 0.4 ms
Lead Channel Pacing Threshold Pulse Width: 0.4 ms
Lead Channel Sensing Intrinsic Amplitude: 8.25 mV
Lead Channel Setting Pacing Amplitude: 1.5 V
Lead Channel Setting Sensing Sensitivity: 0.3 mV
MDC IDC LEAD LOCATION: 753860
MDC IDC MSMT BATTERY REMAINING LONGEVITY: 89 mo
MDC IDC MSMT LEADCHNL RA PACING THRESHOLD AMPLITUDE: 0.625 V
MDC IDC MSMT LEADCHNL RA SENSING INTR AMPL: 2.25 mV
MDC IDC MSMT LEADCHNL RA SENSING INTR AMPL: 2.25 mV
MDC IDC MSMT LEADCHNL RV IMPEDANCE VALUE: 532 Ohm
MDC IDC MSMT LEADCHNL RV PACING THRESHOLD AMPLITUDE: 0.75 V
MDC IDC MSMT LEADCHNL RV SENSING INTR AMPL: 8.25 mV
MDC IDC SESS DTM: 20190124092305
MDC IDC SET LEADCHNL RV PACING AMPLITUDE: 2 V
MDC IDC SET LEADCHNL RV PACING PULSEWIDTH: 0.4 ms
MDC IDC STAT BRADY AP VS PERCENT: 52.63 %
MDC IDC STAT BRADY AS VP PERCENT: 0.02 %

## 2017-11-18 ENCOUNTER — Telehealth: Payer: Self-pay | Admitting: Family Medicine

## 2017-11-18 ENCOUNTER — Ambulatory Visit: Payer: 59 | Admitting: *Deleted

## 2017-11-18 DIAGNOSIS — I469 Cardiac arrest, cause unspecified: Secondary | ICD-10-CM

## 2017-11-18 DIAGNOSIS — Z8674 Personal history of sudden cardiac arrest: Secondary | ICD-10-CM

## 2017-11-18 NOTE — Telephone Encounter (Signed)
Left message for Pam to call back, not sure what she is asking for.  Should this be her cardiologist doing this?

## 2017-11-18 NOTE — Telephone Encounter (Signed)
Copied from [place] [id number]. Topic: General - Other >> Nov 18, 2017  9:11 AM Aurelio Brash B wrote: Reason for CRM:  PT needs quarterly  remote check authorization  for cardiology that is required by [facility]  .     [facility]  -Attention Tere[place] Coop [phone] , Pam phone  [phone] , phone number for   Navigate to get authorization [phone]  Request is to be STAT

## 2017-11-19 ENCOUNTER — Encounter: Payer: Self-pay | Admitting: Cardiology

## 2017-11-19 NOTE — Telephone Encounter (Signed)
Spoke with Dr. Etter Sjogren she stated she did not place the order the last time Cardiology placed it in her name. The order was entered in by cardiology signed by Dr. Etter Sjogren.   Dr. Etter Sjogren stated she will place the order and to let her know.   Referral has been placed.  Patient has been notified

## 2017-11-19 NOTE — Progress Notes (Signed)
Remote ICD transmission.   

## 2017-11-19 NOTE — Telephone Encounter (Signed)
Patient called and wanted to know can you place the referral that was placed in 2017 for the remote check    Please advise

## 2017-11-29 ENCOUNTER — Ambulatory Visit (INDEPENDENT_AMBULATORY_CARE_PROVIDER_SITE_OTHER): Payer: 59 | Admitting: *Deleted

## 2017-11-29 DIAGNOSIS — I469 Cardiac arrest, cause unspecified: Secondary | ICD-10-CM | POA: Diagnosis not present

## 2017-11-30 NOTE — Progress Notes (Signed)
Remote ICD transmission.   

## 2017-12-01 ENCOUNTER — Encounter: Payer: Self-pay | Admitting: Cardiology

## 2017-12-09 ENCOUNTER — Encounter: Payer: Self-pay | Admitting: Family Medicine

## 2017-12-09 ENCOUNTER — Ambulatory Visit (INDEPENDENT_AMBULATORY_CARE_PROVIDER_SITE_OTHER): Payer: 59 | Admitting: Family Medicine

## 2017-12-09 VITALS — BP 110/78 | HR 87 | Temp 98.7°F | Resp 16 | Ht 63.0 in | Wt 151.8 lb

## 2017-12-09 DIAGNOSIS — Z8674 Personal history of sudden cardiac arrest: Secondary | ICD-10-CM | POA: Diagnosis not present

## 2017-12-09 DIAGNOSIS — D229 Melanocytic nevi, unspecified: Secondary | ICD-10-CM

## 2017-12-09 DIAGNOSIS — I421 Obstructive hypertrophic cardiomyopathy: Secondary | ICD-10-CM | POA: Diagnosis not present

## 2017-12-09 DIAGNOSIS — Z0000 Encounter for general adult medical examination without abnormal findings: Secondary | ICD-10-CM

## 2017-12-09 LAB — LIPID PANEL
CHOL/HDL RATIO: 5
Cholesterol: 181 mg/dL (ref 0–200)
HDL: 35.9 mg/dL — AB (ref >39.00)
LDL CALC: 111 mg/dL — AB (ref 0–99)
NonHDL: 145.21
TRIGLYCERIDES: 173 mg/dL — AB (ref 0.0–149.0)
VLDL: 34.6 mg/dL (ref 0.0–40.0)

## 2017-12-09 LAB — CBC WITH DIFFERENTIAL/PLATELET
BASOS PCT: 0.9 % (ref 0.0–3.0)
Basophils Absolute: 0.1 10*3/uL (ref 0.0–0.1)
Eosinophils Absolute: 0.2 10*3/uL (ref 0.0–0.7)
Eosinophils Relative: 3.3 % (ref 0.0–5.0)
HEMATOCRIT: 44.3 % (ref 36.0–46.0)
Hemoglobin: 14.7 g/dL (ref 12.0–15.0)
LYMPHS ABS: 2.4 10*3/uL (ref 0.7–4.0)
LYMPHS PCT: 33 % (ref 12.0–46.0)
MCHC: 33.2 g/dL (ref 30.0–36.0)
MCV: 86.6 fl (ref 78.0–100.0)
MONOS PCT: 6.8 % (ref 3.0–12.0)
Monocytes Absolute: 0.5 10*3/uL (ref 0.1–1.0)
NEUTROS ABS: 4.1 10*3/uL (ref 1.4–7.7)
Neutrophils Relative %: 56 % (ref 43.0–77.0)
PLATELETS: 265 10*3/uL (ref 150.0–400.0)
RBC: 5.11 Mil/uL (ref 3.87–5.11)
RDW: 13.2 % (ref 11.5–15.5)
WBC: 7.4 10*3/uL (ref 4.0–10.5)

## 2017-12-09 LAB — COMPREHENSIVE METABOLIC PANEL
ALT: 28 U/L (ref 0–35)
AST: 21 U/L (ref 0–37)
Albumin: 4.3 g/dL (ref 3.5–5.2)
Alkaline Phosphatase: 73 U/L (ref 39–117)
BUN: 13 mg/dL (ref 6–23)
CALCIUM: 9.7 mg/dL (ref 8.4–10.5)
CHLORIDE: 101 meq/L (ref 96–112)
CO2: 28 meq/L (ref 19–32)
Creatinine, Ser: 0.72 mg/dL (ref 0.40–1.20)
GFR: 90.51 mL/min (ref >60.00)
Glucose, Bld: 98 mg/dL (ref 70–99)
Potassium: 4.4 mEq/L (ref 3.5–5.1)
Sodium: 137 mEq/L (ref 135–145)
Total Bilirubin: 0.4 mg/dL (ref 0.2–1.2)
Total Protein: 6.8 g/dL (ref 6.0–8.3)

## 2017-12-09 LAB — TSH: TSH: 0.93 u[IU]/mL (ref 0.35–4.50)

## 2017-12-09 NOTE — Progress Notes (Signed)
Subjective:     Teresa Henry is a 52 y.o. female and is here for a comprehensive physical exam. The patient reports no problems.  Social History   Socioeconomic History  . Marital status: Married    Spouse name: Not on file  . Number of children: Not on file  . Years of education: Not on file  . Highest education level: Not on file  Occupational History  . Occupation: Agricultural engineer-- self employed  Social Needs  . Financial resource strain: Not on file  . Food insecurity:    Worry: Not on file    Inability: Not on file  . Transportation needs:    Medical: Not on file    Non-medical: Not on file  Tobacco Use  . Smoking status: Never Smoker  . Smokeless tobacco: Never Used  Substance and Sexual Activity  . Alcohol use: No  . Drug use: No  . Sexual activity: Yes    Partners: Male  Lifestyle  . Physical activity:    Days per week: Not on file    Minutes per session: Not on file  . Stress: Not on file  Relationships  . Social connections:    Talks on phone: Not on file    Gets together: Not on file    Attends religious service: Not on file    Active member of club or organization: Not on file    Attends meetings of clubs or organizations: Not on file    Relationship status: Not on file  . Intimate partner violence:    Fear of current or ex partner: Not on file    Emotionally abused: Not on file    Physically abused: Not on file    Forced sexual activity: Not on file  Other Topics Concern  . Not on file  Social History Narrative  . Not on file   Health Maintenance  Topic Date Due  . COLONOSCOPY  03/28/2016  . MAMMOGRAM  10/13/2016  . PAP SMEAR  10/13/2016  . INFLUENZA VACCINE  02/24/2018  . TETANUS/TDAP  04/19/2022  . HIV Screening  Completed    The following portions of the patient's history were reviewed and updated as appropriate:  She  has a past medical history of Cardiac arrest ([place]), Frequent episodic tension-type headache, and Prolonged Q-T  interval on ECG. She does not have any pertinent problems on file. She  has a past surgical history that includes Breast biopsy (1999); Cardiac catheterization (N/A, 02/28/2015); and Cardiac catheterization (N/A, 02/28/2015). Her family history includes Arthritis in her father and mother; Heart attack (age of onset: 84) in her brother; Hypertension in her father and mother; Stroke in her maternal grandmother and paternal grandmother. She  reports that she has never smoked. She has never used smokeless tobacco. She reports that she does not drink alcohol or use drugs. She has a current medication list which includes the following prescription(s): aspirin, b complex vitamins, and nadolol. Current Outpatient Medications on File Prior to Visit  Medication Sig Dispense Refill  . aspirin 81 MG tablet Take 81 mg by mouth daily.    Marland Kitchen b complex vitamins tablet Take 1 tablet by mouth daily.    . nadolol (CORGARD) 40 MG tablet TAKE ONE AND ONE-HALF TABLETS (60 MG TOTAL) BY MOUTH 2 (TWO) TIMES DAILY. 270 tablet 2   No current facility-administered medications on file prior to visit.    She has No Known Allergies..  Review of Systems Review of Systems  Constitutional: Negative  for activity change, appetite change and fatigue.  HENT: Negative for hearing loss, congestion, tinnitus and ear discharge.  dentist q77m Eyes: Negative for visual disturbance (see optho q1y -- vision corrected to 20/20 with glasses).  Respiratory: Negative for cough, chest tightness and shortness of breath.   Cardiovascular: Negative for chest pain, palpitations and leg swelling.  Gastrointestinal: Negative for abdominal pain, diarrhea, constipation and abdominal distention.  Genitourinary: Negative for urgency, frequency, decreased urine volume and difficulty urinating.  Musculoskeletal: Negative for back pain, arthralgias and gait problem.  Skin: Negative for color change, pallor and rash.  Neurological: Negative for dizziness,  light-headedness, numbness and headaches.  Hematological: Negative for adenopathy. Does not bruise/bleed easily.  Psychiatric/Behavioral: Negative for suicidal ideas, confusion, sleep disturbance, self-injury, dysphoric mood, decreased concentration and agitation.       Objective:    BP 110/78 (BP Location: Left Arm, Cuff Size: Normal)   Pulse 87   Temp 98.7 F (37.1 C) (Oral)   Resp 16   Ht 5\' 3"  (1.6 m)   Wt 151 lb 12.8 oz (68.9 kg)   LMP 12/02/2017   SpO2 97%   BMI 26.89 kg/m  General appearance: alert, cooperative, appears stated age and no distress Head: Normocephalic, without obvious abnormality, atraumatic Eyes: conjunctivae/corneas clear. PERRL, EOM's intact. Fundi benign. Ears: normal TM's and external ear canals both ears Nose: Nares normal. Septum midline. Mucosa normal. No drainage or sinus tenderness. Throat: lips, mucosa, and tongue normal; teeth and gums normal Neck: no adenopathy, no carotid bruit, no JVD, supple, symmetrical, trachea midline and thyroid not enlarged, symmetric, no tenderness/mass/nodules Back: symmetric, no curvature. ROM normal. No CVA tenderness. Lungs: clear to auscultation bilaterally Breasts: gyn Heart: regular rate and rhythm, S1, S2 normal, no murmur, click, rub or gallop Abdomen: soft, non-tender; bowel sounds normal; no masses,  no organomegaly Pelvic: deferred -gyn Extremities: extremities normal, atraumatic, no cyanosis or edema Pulses: 2+ and symmetric Skin: Skin color, texture, turgor normal. No rashes or lesions Lymph nodes: Cervical, supraclavicular, and axillary nodes normal. Neurologic: Alert and oriented X 3, normal strength and tone. Normal symmetric reflexes. Normal coordination and gait    Assessment:    Healthy female exam.      Plan:    ghm utd Check labs See After Visit Summary for Counseling Recommendations    1. Preventative health care See above - CBC with Differential/Platelet - Comprehensive metabolic  panel - Lipid panel - TSH - Ambulatory referral to Gastroenterology  2. Cardiomyopathy, hypertrophic obstructive (HCC) Pt needs new referral  - CBC with Differential/Platelet - Comprehensive metabolic panel - Lipid panel - TSH - Ambulatory referral to Cardiology  3. Suspicious nevus   - Ambulatory referral to Dermatology  4. History of cardiac arrest   - Ambulatory referral to Cardiology

## 2017-12-09 NOTE — Patient Instructions (Signed)
Preventive Care 40-64 Years, Female Preventive care refers to lifestyle choices and visits with your health care provider that can promote health and wellness. What does preventive care include?  A yearly physical exam. This is also called an annual well check.  Dental exams once or twice a year.  Routine eye exams. Ask your health care provider how often you should have your eyes checked.  Personal lifestyle choices, including: ? Daily care of your teeth and gums. ? Regular physical activity. ? Eating a healthy diet. ? Avoiding tobacco and drug use. ? Limiting alcohol use. ? Practicing safe sex. ? Taking low-dose aspirin daily starting at age 58. ? Taking vitamin and mineral supplements as recommended by your health care provider. What happens during an annual well check? The services and screenings done by your health care provider during your annual well check will depend on your age, overall health, lifestyle risk factors, and family history of disease. Counseling Your health care provider may ask you questions about your:  Alcohol use.  Tobacco use.  Drug use.  Emotional well-being.  Home and relationship well-being.  Sexual activity.  Eating habits.  Work and work Statistician.  Method of birth control.  Menstrual cycle.  Pregnancy history.  Screening You may have the following tests or measurements:  Height, weight, and BMI.  Blood pressure.  Lipid and cholesterol levels. These may be checked every 5 years, or more frequently if you are over 81 years old.  Skin check.  Lung cancer screening. You may have this screening every year starting at age 78 if you have a 30-pack-year history of smoking and currently smoke or have quit within the past 15 years.  Fecal occult blood test (FOBT) of the stool. You may have this test every year starting at age 65.  Flexible sigmoidoscopy or colonoscopy. You may have a sigmoidoscopy every 5 years or a colonoscopy  every 10 years starting at age 30.  Hepatitis C blood test.  Hepatitis B blood test.  Sexually transmitted disease (STD) testing.  Diabetes screening. This is done by checking your blood sugar (glucose) after you have not eaten for a while (fasting). You may have this done every 1-3 years.  Mammogram. This may be done every 1-2 years. Talk to your health care provider about when you should start having regular mammograms. This may depend on whether you have a family history of breast cancer.  BRCA-related cancer screening. This may be done if you have a family history of breast, ovarian, tubal, or peritoneal cancers.  Pelvic exam and Pap test. This may be done every 3 years starting at age 80. Starting at age 36, this may be done every 5 years if you have a Pap test in combination with an HPV test.  Bone density scan. This is done to screen for osteoporosis. You may have this scan if you are at high risk for osteoporosis.  Discuss your test results, treatment options, and if necessary, the need for more tests with your health care provider. Vaccines Your health care provider may recommend certain vaccines, such as:  Influenza vaccine. This is recommended every year.  Tetanus, diphtheria, and acellular pertussis (Tdap, Td) vaccine. You may need a Td booster every 10 years.  Varicella vaccine. You may need this if you have not been vaccinated.  Zoster vaccine. You may need this after age 5.  Measles, mumps, and rubella (MMR) vaccine. You may need at least one dose of MMR if you were born in  1957 or later. You may also need a second dose.  Pneumococcal 13-valent conjugate (PCV13) vaccine. You may need this if you have certain conditions and were not previously vaccinated.  Pneumococcal polysaccharide (PPSV23) vaccine. You may need one or two doses if you smoke cigarettes or if you have certain conditions.  Meningococcal vaccine. You may need this if you have certain  conditions.  Hepatitis A vaccine. You may need this if you have certain conditions or if you travel or work in places where you may be exposed to hepatitis A.  Hepatitis B vaccine. You may need this if you have certain conditions or if you travel or work in places where you may be exposed to hepatitis B.  Haemophilus influenzae type b (Hib) vaccine. You may need this if you have certain conditions.  Talk to your health care provider about which screenings and vaccines you need and how often you need them. This information is not intended to replace advice given to you by your health care provider. Make sure you discuss any questions you have with your health care provider. Document Released: 08/09/2015 Document Revised: 04/01/2016 Document Reviewed: 05/14/2015 Elsevier Interactive Patient Education  2018 Elsevier Inc.  

## 2017-12-17 LAB — CUP PACEART REMOTE DEVICE CHECK
Battery Remaining Longevity: 84 mo
Battery Voltage: 2.97 V
Brady Statistic AP VS Percent: 75.45 %
Brady Statistic RA Percent Paced: 75.43 %
Brady Statistic RV Percent Paced: 0.05 %
Date Time Interrogation Session: 20190507083726
HIGH POWER IMPEDANCE MEASURED VALUE: 63 Ohm
Implantable Lead Implant Date: 20160804
Implantable Lead Location: 753859
Implantable Lead Model: 5076
Implantable Pulse Generator Implant Date: 20160804
Lead Channel Impedance Value: 399 Ohm
Lead Channel Impedance Value: 513 Ohm
Lead Channel Pacing Threshold Amplitude: 0.5 V
Lead Channel Pacing Threshold Amplitude: 0.625 V
Lead Channel Setting Pacing Amplitude: 2 V
Lead Channel Setting Sensing Sensitivity: 0.3 mV
MDC IDC LEAD IMPLANT DT: 20160804
MDC IDC LEAD LOCATION: 753860
MDC IDC MSMT LEADCHNL RA IMPEDANCE VALUE: 475 Ohm
MDC IDC MSMT LEADCHNL RA PACING THRESHOLD PULSEWIDTH: 0.4 ms
MDC IDC MSMT LEADCHNL RA SENSING INTR AMPL: 1.25 mV
MDC IDC MSMT LEADCHNL RA SENSING INTR AMPL: 1.25 mV
MDC IDC MSMT LEADCHNL RV PACING THRESHOLD PULSEWIDTH: 0.4 ms
MDC IDC MSMT LEADCHNL RV SENSING INTR AMPL: 7.875 mV
MDC IDC MSMT LEADCHNL RV SENSING INTR AMPL: 7.875 mV
MDC IDC SET LEADCHNL RA PACING AMPLITUDE: 1.5 V
MDC IDC SET LEADCHNL RV PACING PULSEWIDTH: 0.4 ms
MDC IDC STAT BRADY AP VP PERCENT: 0.03 %
MDC IDC STAT BRADY AS VP PERCENT: 0.02 %
MDC IDC STAT BRADY AS VS PERCENT: 24.5 %

## 2017-12-31 ENCOUNTER — Other Ambulatory Visit: Payer: Self-pay | Admitting: Internal Medicine

## 2018-02-11 ENCOUNTER — Encounter: Payer: Self-pay | Admitting: Family Medicine

## 2018-02-14 ENCOUNTER — Encounter: Payer: Self-pay | Admitting: Gastroenterology

## 2018-02-24 DIAGNOSIS — D2239 Melanocytic nevi of other parts of face: Secondary | ICD-10-CM | POA: Diagnosis not present

## 2018-02-24 DIAGNOSIS — D1801 Hemangioma of skin and subcutaneous tissue: Secondary | ICD-10-CM | POA: Diagnosis not present

## 2018-02-24 DIAGNOSIS — D485 Neoplasm of uncertain behavior of skin: Secondary | ICD-10-CM | POA: Diagnosis not present

## 2018-02-24 DIAGNOSIS — D225 Melanocytic nevi of trunk: Secondary | ICD-10-CM | POA: Diagnosis not present

## 2018-03-04 ENCOUNTER — Encounter: Payer: Self-pay | Admitting: Internal Medicine

## 2018-03-07 ENCOUNTER — Ambulatory Visit (INDEPENDENT_AMBULATORY_CARE_PROVIDER_SITE_OTHER): Payer: 59 | Admitting: *Deleted

## 2018-03-07 DIAGNOSIS — Z23 Encounter for immunization: Secondary | ICD-10-CM | POA: Diagnosis not present

## 2018-03-07 DIAGNOSIS — I469 Cardiac arrest, cause unspecified: Secondary | ICD-10-CM | POA: Diagnosis not present

## 2018-03-07 NOTE — Progress Notes (Signed)
Remote ICD transmission.   

## 2018-03-08 ENCOUNTER — Encounter: Payer: Self-pay | Admitting: Cardiology

## 2018-03-16 ENCOUNTER — Encounter: Payer: Self-pay | Admitting: *Deleted

## 2018-03-25 ENCOUNTER — Ambulatory Visit: Payer: 59 | Admitting: Internal Medicine

## 2018-03-25 ENCOUNTER — Encounter: Payer: Self-pay | Admitting: Internal Medicine

## 2018-03-25 VITALS — BP 118/70 | HR 83 | Ht 63.0 in | Wt 147.0 lb

## 2018-03-25 DIAGNOSIS — Z95810 Presence of automatic (implantable) cardiac defibrillator: Secondary | ICD-10-CM | POA: Diagnosis not present

## 2018-03-25 DIAGNOSIS — I469 Cardiac arrest, cause unspecified: Secondary | ICD-10-CM

## 2018-03-25 DIAGNOSIS — I495 Sick sinus syndrome: Secondary | ICD-10-CM

## 2018-03-25 MED ORDER — NADOLOL 40 MG PO TABS: ORAL_TABLET | ORAL | 3 refills | Status: DC

## 2018-03-25 NOTE — Progress Notes (Signed)
HPI Teresa Henry returns today for followup. She is a 52 yo woman with a h/o syncope who was found to have VF and successfully defibrillated while in the hospital over a year ago. Her QT interval was increased and she was given a presumptive diagnosis of long QT. She was noted to have an LV thickness of nearly 16 mm. She also had some sinus bradycardia. She underwent insertion of a DDD ICD and has undergone genetic testing which failed to reveal a QT prolonging gene and most recently no evidence of HCM. In the interim she has done well. She is exercising regularly and has changed her eating habits and lost 6 lbs. No other complaints today. No Known Allergies   Current Outpatient Medications  Medication Sig Dispense Refill  . aspirin 81 MG tablet Take 81 mg by mouth daily.    Marland Kitchen b complex vitamins tablet Take 1 tablet by mouth daily.    . nadolol (CORGARD) 40 MG tablet TAKE ONE AND ONE-HALF TABLETS (60 MG TOTAL) BY MOUTH 2 (TWO) TIMES DAILY. 270 tablet 0   No current facility-administered medications for this visit.      Past Medical History:  Diagnosis Date  . Cardiac arrest [facility])    a. s/p MDT dual chamber ICD  . Frequent episodic tension-type headache   . Prolonged Q-T interval on ECG     ROS:   All systems reviewed and negative except as noted in the HPI.   Past Surgical History:  Procedure Laterality Date  . BREAST BIOPSY  1999   Needle Biopsy- Non cancer  . CARDIAC CATHETERIZATION N/A 02/28/2015   Procedure: Left Heart Cath and Coronary Angiography;  Surgeon: Belva Crome, MD;  Location: [facility];  Service: Cardiovascular;  Laterality: N/A;  . EP IMPLANTABLE DEVICE N/A 02/28/2015   MDT dual chamber ICD implanted by Dr Rayann Heman for secondary prevention     Family History  Problem Relation Age of Onset  . Arthritis Mother   . Hypertension Mother   . Arthritis Father   . Hypertension Father   . Heart attack Brother 80  . Stroke Maternal Grandmother   .  Stroke Paternal Grandmother      Social History   Socioeconomic History  . Marital status: Married    Spouse name: Not on file  . Number of children: Not on file  . Years of education: Not on file  . Highest education level: Not on file  Occupational History  . Occupation: Agricultural engineer-- self employed  Social Needs  . Financial resource strain: Not on file  . Food insecurity:    Worry: Not on file    Inability: Not on file  . Transportation needs:    Medical: Not on file    Non-medical: Not on file  Tobacco Use  . Smoking status: Never Smoker  . Smokeless tobacco: Never Used  Substance and Sexual Activity  . Alcohol use: No  . Drug use: No  . Sexual activity: Yes    Partners: Male  Lifestyle  . Physical activity:    Days per week: Not on file    Minutes per session: Not on file  . Stress: Not on file  Relationships  . Social connections:    Talks on phone: Not on file    Gets together: Not on file    Attends religious service: Not on file    Active member of club or organization: Not on file  Attends meetings of clubs or organizations: Not on file    Relationship status: Not on file  . Intimate partner violence:    Fear of current or ex partner: Not on file    Emotionally abused: Not on file    Physically abused: Not on file    Forced sexual activity: Not on file  Other Topics Concern  . Not on file  Social History Narrative  . Not on file     BP 118/70   Pulse 83   Ht 5\' 3"  (1.6 m)   Wt 147 lb (66.7 kg)   BMI 26.04 kg/m   Physical Exam:  Well appearing NAD HEENT: Unremarkable Neck:  No JVD, no thyromegally Lymphatics:  No adenopathy Back:  No CVA tenderness Lungs:  Clear with no wheezes HEART:  Regular rate rhythm, no murmurs, no rubs, no clicks Abd:  soft, positive bowel sounds, no organomegally, no rebound, no guarding Ext:  2 plus pulses, no edema, no cyanosis, no clubbing Skin:  No rashes no nodules Neuro:  CN II through XII  intact, motor grossly intact  EKG - NSR with atrial pacing  DEVICE  Normal device function.  See PaceArt for details.   Assess/Plan: 1. 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 She did not have a QT prolonging gene on genetic testing. 2. ICD - her DDD ICD Scientist, physiological) is working normally. Will recheck in several months. 3. Sinus node dysfunction - she is pacing in the atrium about 60% of the time.  4. 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   Mikle Bosworth.D.

## 2018-03-25 NOTE — Patient Instructions (Signed)
Medication Instructions:  Your physician recommends that you continue on your current medications as directed. Please refer to the Current Medication list given to you today.  Labwork: None ordered.  Testing/Procedures: None ordered.  Follow-Up: Your physician wants you to follow-up in: one year with Dr. Lovena Le.   You will receive a reminder letter in the mail two months in advance. If you don't receive a letter, please call our office to schedule the follow-up appointment.  Remote monitoring is used to monitor your ICD from home. This monitoring reduces the number of office visits required to check your device to one time per year. It allows [place] to keep an eye on the functioning of your device to ensure it is working properly. You are scheduled for a device check from home on 06/06/2018. You may send your transmission at any time that day. If you have a wireless device, the transmission will be sent automatically. After your physician reviews your transmission, you will receive a postcard with your next transmission date.  Any Other Special Instructions Will Be Listed Below (If Applicable).  If you need a refill on your cardiac medications before your next appointment, please call your pharmacy.  3

## 2018-03-29 DIAGNOSIS — Z01419 Encounter for gynecological examination (general) (routine) without abnormal findings: Secondary | ICD-10-CM | POA: Diagnosis not present

## 2018-03-29 DIAGNOSIS — Z124 Encounter for screening for malignant neoplasm of cervix: Secondary | ICD-10-CM | POA: Diagnosis not present

## 2018-03-29 DIAGNOSIS — Z1231 Encounter for screening mammogram for malignant neoplasm of breast: Secondary | ICD-10-CM | POA: Diagnosis not present

## 2018-03-31 LAB — CUP PACEART REMOTE DEVICE CHECK
Battery Remaining Longevity: 77 mo
Battery Voltage: 2.98 V
Brady Statistic AP VS Percent: 72.73 %
Brady Statistic RA Percent Paced: 72.72 %
Brady Statistic RV Percent Paced: 0.05 %
HIGH POWER IMPEDANCE MEASURED VALUE: 63 Ohm
Implantable Lead Implant Date: 20160804
Implantable Lead Location: 753859
Implantable Lead Model: 5076
Implantable Pulse Generator Implant Date: 20160804
Lead Channel Impedance Value: 513 Ohm
Lead Channel Pacing Threshold Amplitude: 0.5 V
Lead Channel Pacing Threshold Pulse Width: 0.4 ms
Lead Channel Sensing Intrinsic Amplitude: 1.625 mV
Lead Channel Sensing Intrinsic Amplitude: 7.875 mV
Lead Channel Setting Pacing Amplitude: 1.5 V
Lead Channel Setting Pacing Pulse Width: 0.4 ms
Lead Channel Setting Sensing Sensitivity: 0.3 mV
MDC IDC LEAD IMPLANT DT: 20160804
MDC IDC LEAD LOCATION: 753860
MDC IDC MSMT LEADCHNL RA IMPEDANCE VALUE: 456 Ohm
MDC IDC MSMT LEADCHNL RA SENSING INTR AMPL: 1.625 mV
MDC IDC MSMT LEADCHNL RV IMPEDANCE VALUE: 399 Ohm
MDC IDC MSMT LEADCHNL RV PACING THRESHOLD AMPLITUDE: 0.75 V
MDC IDC MSMT LEADCHNL RV PACING THRESHOLD PULSEWIDTH: 0.4 ms
MDC IDC MSMT LEADCHNL RV SENSING INTR AMPL: 7.875 mV
MDC IDC SESS DTM: 20190812073524
MDC IDC SET LEADCHNL RV PACING AMPLITUDE: 2 V
MDC IDC STAT BRADY AP VP PERCENT: 0.03 %
MDC IDC STAT BRADY AS VP PERCENT: 0.01 %
MDC IDC STAT BRADY AS VS PERCENT: 27.23 %

## 2018-04-06 ENCOUNTER — Encounter: Payer: Self-pay | Admitting: Gastroenterology

## 2018-04-06 ENCOUNTER — Ambulatory Visit (AMBULATORY_SURGERY_CENTER): Payer: Self-pay | Admitting: *Deleted

## 2018-04-06 VITALS — Ht 63.0 in | Wt 148.0 lb

## 2018-04-06 DIAGNOSIS — Z1211 Encounter for screening for malignant neoplasm of colon: Secondary | ICD-10-CM

## 2018-04-06 MED ORDER — NA SULFATE-K SULFATE-MG SULF 17.5-3.13-1.6 GM/177ML PO SOLN: ORAL | 0 refills | Status: DC

## 2018-04-06 NOTE — Progress Notes (Signed)
Patient denies any allergies to eggs or soy. Patient denies any problems with anesthesia/sedation. "hard to wake up" per pt. Patient denies any oxygen use at home. Patient denies taking any diet/weight loss medications or blood thinners. EMMI education assisgned to patient on colonoscopy, this was explained and instructions given to patient. suprep coupon printed and given to pt.

## 2018-04-11 ENCOUNTER — Other Ambulatory Visit (INDEPENDENT_AMBULATORY_CARE_PROVIDER_SITE_OTHER): Payer: 59

## 2018-04-11 DIAGNOSIS — E785 Hyperlipidemia, unspecified: Secondary | ICD-10-CM

## 2018-04-11 LAB — COMPREHENSIVE METABOLIC PANEL
ALK PHOS: 85 U/L (ref 39–117)
ALT: 31 U/L (ref 0–35)
AST: 24 U/L (ref 0–37)
Albumin: 4.5 g/dL (ref 3.5–5.2)
BUN: 9 mg/dL (ref 6–23)
CHLORIDE: 102 meq/L (ref 96–112)
CO2: 31 meq/L (ref 19–32)
Calcium: 9.8 mg/dL (ref 8.4–10.5)
Creatinine, Ser: 0.76 mg/dL (ref 0.40–1.20)
GFR: 84.93 mL/min (ref >60.00)
GLUCOSE: 95 mg/dL (ref 70–99)
POTASSIUM: 4.6 meq/L (ref 3.5–5.1)
Sodium: 137 mEq/L (ref 135–145)
Total Bilirubin: 0.5 mg/dL (ref 0.2–1.2)
Total Protein: 6.7 g/dL (ref 6.0–8.3)

## 2018-04-11 LAB — LIPID PANEL
CHOL/HDL RATIO: 5
CHOLESTEROL: 183 mg/dL (ref 0–200)
HDL: 35.8 mg/dL — AB (ref >39.00)
LDL CALC: 118 mg/dL — AB (ref 0–99)
NonHDL: 147.37
Triglycerides: 147 mg/dL (ref 0.0–149.0)
VLDL: 29.4 mg/dL (ref 0.0–40.0)

## 2018-04-15 NOTE — Addendum Note (Signed)
Addended byDamita Dunnings D on: 04/15/2018 01:12 PM   Modules accepted: Orders

## 2018-04-18 ENCOUNTER — Telehealth: Payer: Self-pay

## 2018-04-18 ENCOUNTER — Telehealth: Payer: Self-pay | Admitting: *Deleted

## 2018-04-18 NOTE — Telephone Encounter (Signed)
Pt called to advice about her problem with the anesthesia and her heart condition.  Please seen notes.

## 2018-04-18 NOTE — Telephone Encounter (Signed)
Noted. Jenny Reichmann called the pt also. Thank you!

## 2018-04-18 NOTE — Telephone Encounter (Signed)
Dr.Taylor, this patient is having a colonoscopy with propofol on Wednesday 04/20/18 with Dr.Armbruster. Does this patient have cardiac clearance from you using propofol with her cardiac history. She had a recent office visit with you on 03/25/18. Please advise. Thank you,Maxamillian Tienda,RN at [facility].

## 2018-04-18 NOTE — Telephone Encounter (Signed)
Received a call from Sonia Baller at Dr. Tanna Furry office, patient had contacted the cardiology office and let them know about her procedure scheduled on 9/25. Sonia Baller called here as they have not received any request for cardiac clearance. Patient saw our pre-visit nurse, Sundra Aland on 04/06/18. Spoke to Barb Merino, Therapist, sports, records are reviewed ahead of time by anesthesia. We have not received any information about concerns and having to route this patient to the hospital.

## 2018-04-18 NOTE — Telephone Encounter (Signed)
Osvaldo Angst CRNA, please review this patient's chart. She is for colonoscopy on Wednesday 04/20/18 with Dr.Armbruster at [facility]. She does have cardiac history. Patient is concerned about getting propofol due to her cardiac history and having a " prolong QT" per pt.  I did send note to Dr.Taylor also for cardiac clearance. Please advise. Thank you, Charmika Macdonnell pv.

## 2018-04-18 NOTE — Telephone Encounter (Signed)
Shirlean Mylar,  Thanks for making me aware.  This pt has an ICD/pacemaker so is cleared for anesthetic care at [facility].  Thanks,  Osvaldo Angst

## 2018-04-20 ENCOUNTER — Encounter: Payer: Self-pay | Admitting: Gastroenterology

## 2018-04-20 ENCOUNTER — Ambulatory Visit (AMBULATORY_SURGERY_CENTER): Payer: 59 | Admitting: Gastroenterology

## 2018-04-20 VITALS — BP 120/74 | HR 61 | Temp 98.6°F | Resp 11 | Ht 63.0 in | Wt 148.0 lb

## 2018-04-20 DIAGNOSIS — D123 Benign neoplasm of transverse colon: Secondary | ICD-10-CM | POA: Diagnosis not present

## 2018-04-20 DIAGNOSIS — Z1211 Encounter for screening for malignant neoplasm of colon: Secondary | ICD-10-CM

## 2018-04-20 DIAGNOSIS — D122 Benign neoplasm of ascending colon: Secondary | ICD-10-CM | POA: Diagnosis not present

## 2018-04-20 HISTORY — PX: COLONOSCOPY: SHX174

## 2018-04-20 MED ORDER — SODIUM CHLORIDE 0.9 % IV SOLN: 500.0000 mL | Freq: Once | INTRAVENOUS | Status: DC

## 2018-04-20 NOTE — Progress Notes (Signed)
Alert and oriented x 3, pleased with MAC, report to RN

## 2018-04-20 NOTE — Progress Notes (Signed)
Called to room to assist during endoscopic procedure.  Patient ID and intended procedure confirmed with present staff. Received instructions for my participation in the procedure from the performing physician.  

## 2018-04-20 NOTE — Patient Instructions (Signed)
YOU HAD AN ENDOSCOPIC PROCEDURE TODAY AT THE [facility]:   Refer to the procedure report that was given to you for any specific questions about what was found during the examination.  If the procedure report does not answer your questions, please call your gastroenterologist to clarify.  If you requested that your care partner not be given the details of your procedure findings, then the procedure report has been included in a sealed envelope for you to review at your convenience later.  YOU SHOULD EXPECT: Some feelings of bloating in the abdomen. Passage of more gas than usual.  Walking can help get rid of the air that was put into your GI tract during the procedure and reduce the bloating. If you had a lower endoscopy (such as a colonoscopy or flexible sigmoidoscopy) you may notice spotting of blood in your stool or on the toilet paper. If you underwent a bowel prep for your procedure, you may not have a normal bowel movement for a few days.  Please Note:  You might notice some irritation and congestion in your nose or some drainage.  This is from the oxygen used during your procedure.  There is no need for concern and it should clear up in a day or so.  SYMPTOMS TO REPORT IMMEDIATELY:   Following lower endoscopy (colonoscopy or flexible sigmoidoscopy):  Excessive amounts of blood in the stool  Significant tenderness or worsening of abdominal pains  Swelling of the abdomen that is new, acute  Fever of 100F or higher    For urgent or emergent issues, a gastroenterologist can be reached at any hour by calling [phone].   DIET:  We do recommend a small meal at first, but then you may proceed to your regular diet.  Drink plenty of fluids but you should avoid alcoholic beverages for 24 hours.  ACTIVITY:  You should plan to take it easy for the rest of today and you should NOT DRIVE or use heavy machinery until tomorrow (because of the sedation medicines used during the test).     FOLLOW UP: Our staff will call the number listed on your records the next business day following your procedure to check on you and address any questions or concerns that you may have regarding the information given to you following your procedure. If we do not reach you, we will leave a message.  However, if you are feeling well and you are not experiencing any problems, there is no need to return our call.  We will assume that you have returned to your regular daily activities without incident.  If any biopsies were taken you will be contacted by phone or by letter within the next 1-3 weeks.  Please call us at [phone] if you have not heard about the biopsies in 3 weeks.    SIGNATURES/CONFIDENTIALITY: You and/or your care partner have signed paperwork which will be entered into your electronic medical record.  These signatures attest to the fact that that the information above on your After Visit Summary has been reviewed and is understood.  Full responsibility of the confidentiality of this discharge information lies with you and/or your care-partner.  Polyp and diverticulosis information given. 

## 2018-04-20 NOTE — Op Note (Signed)
[facility] Patient Name: Teresa Henry Procedure Date: 04/20/2018 9:18 AM MRN: [id number] Endoscopist: Remo Lipps P. Havery Moros , MD Age: 52 Referring MD:  Date of Birth: [birth year] Gender: Female Account #: [id number] Procedure:                Colonoscopy Indications:              Screening for colorectal malignant neoplasm, This                            is the patient's first colonoscopy Medicines:                Monitored Anesthesia Care Procedure:                Pre-Anesthesia Assessment:                           - Prior to the procedure, a History and Physical                            was performed, and patient medications and                            allergies were reviewed. The patient's tolerance of                            previous anesthesia was also reviewed. The risks                            and benefits of the procedure and the sedation                            options and risks were discussed with the patient.                            All questions were answered, and informed consent                            was obtained. Prior Anticoagulants: The patient has                            taken no previous anticoagulant or antiplatelet                            agents. ASA Grade Assessment: II - A patient with                            mild systemic disease. After reviewing the risks                            and benefits, the patient was deemed in                            satisfactory condition to undergo the procedure.  After obtaining informed consent, the colonoscope                            was passed under direct vision. Throughout the                            procedure, the patient's blood pressure, pulse, and                            oxygen saturations were monitored continuously. The                            Model PCF-[id number]) scope was introduced                            through the anus  and advanced to the the cecum,                            identified by appendiceal orifice and ileocecal                            valve. The colonoscopy was performed without                            difficulty. The patient tolerated the procedure                            well. The quality of the bowel preparation was                            good. The ileocecal valve, appendiceal orifice, and                            rectum were photographed. Scope In: 9:22:40 AM Scope Out: 9:44:32 AM Scope Withdrawal Time: 0 hours 18 minutes 3 seconds  Total Procedure Duration: 0 hours 21 minutes 52 seconds  Findings:                 The perianal and digital rectal examinations were                            normal.                           Two sessile polyps were found in the ascending                            colon. The polyps were 4 to 8 mm in size. These                            polyps were removed with a cold snare. Resection                            and retrieval were complete.  A 4-5 mm polyp was found in the transverse colon.                            The polyp was sessile. The polyp was removed with a                            cold snare. Resection and retrieval were complete.                           Multiple medium-mouthed diverticula were found in                            the sigmoid colon and descending colon.                           The colon was tortuous.                           The exam was otherwise without abnormality. Complications:            No immediate complications. Estimated blood loss:                            Minimal. Estimated Blood Loss:     Estimated blood loss was minimal. Impression:               - Two 4 to 8 mm polyps in the ascending colon,                            removed with a cold snare. Resected and retrieved.                           - One 4-5 mm polyp in the transverse colon, removed                             with a cold snare. Resected and retrieved.                           - Diverticulosis in the sigmoid colon and in the                            descending colon.                           - Tortuous colon.                           - The examination was otherwise normal. Recommendation:           - Patient has a contact number available for                            emergencies. The signs and symptoms of potential  delayed complications were discussed with the                            patient. Return to normal activities tomorrow.                            Written discharge instructions were provided to the                            patient.                           - Resume previous diet.                           - Continue present medications.                           - Await pathology results. Remo Lipps P. Armbruster, MD 04/20/2018 9:51:05 AM This report has been signed electronically.

## 2018-04-21 ENCOUNTER — Telehealth: Payer: Self-pay

## 2018-04-21 NOTE — Telephone Encounter (Signed)
  Follow up Call-  Call back number 04/20/2018  Post procedure Call Back phone  # [phone]  Permission to leave phone message Yes  Some recent data might be hidden     No ID on answering machine Jovon Winterhalter/call-back

## 2018-06-06 ENCOUNTER — Telehealth: Payer: Self-pay | Admitting: Cardiology

## 2018-06-06 ENCOUNTER — Ambulatory Visit (INDEPENDENT_AMBULATORY_CARE_PROVIDER_SITE_OTHER): Payer: 59 | Admitting: *Deleted

## 2018-06-06 DIAGNOSIS — I472 Ventricular tachycardia, unspecified: Secondary | ICD-10-CM

## 2018-06-06 DIAGNOSIS — I469 Cardiac arrest, cause unspecified: Secondary | ICD-10-CM

## 2018-06-06 NOTE — Telephone Encounter (Signed)
Spoke with pt and reminded pt of remote transmission that is due today. Pt verbalized understanding.   

## 2018-06-07 NOTE — Progress Notes (Signed)
Remote ICD transmission.   

## 2018-08-06 LAB — CUP PACEART REMOTE DEVICE CHECK
Battery Remaining Longevity: 69 mo
Battery Voltage: 2.97 V
Brady Statistic AP VS Percent: 69.62 %
Brady Statistic AS VP Percent: 0.02 %
Brady Statistic RA Percent Paced: 69.58 %
Date Time Interrogation Session: 20191112021320
HighPow Impedance: 69 Ohm
Implantable Lead Implant Date: 20160804
Implantable Lead Location: 753860
Implantable Lead Model: 5076
Implantable Pulse Generator Implant Date: 20160804
Lead Channel Impedance Value: 589 Ohm
Lead Channel Pacing Threshold Amplitude: 0.75 V
Lead Channel Pacing Threshold Pulse Width: 0.4 ms
Lead Channel Sensing Intrinsic Amplitude: 2.125 mV
Lead Channel Sensing Intrinsic Amplitude: 7.625 mV
Lead Channel Sensing Intrinsic Amplitude: 7.625 mV
Lead Channel Setting Pacing Amplitude: 2 V
Lead Channel Setting Pacing Pulse Width: 0.4 ms
MDC IDC LEAD IMPLANT DT: 20160804
MDC IDC LEAD LOCATION: 753859
MDC IDC MSMT LEADCHNL RA IMPEDANCE VALUE: 456 Ohm
MDC IDC MSMT LEADCHNL RA PACING THRESHOLD AMPLITUDE: 0.625 V
MDC IDC MSMT LEADCHNL RA SENSING INTR AMPL: 2.125 mV
MDC IDC MSMT LEADCHNL RV IMPEDANCE VALUE: 475 Ohm
MDC IDC MSMT LEADCHNL RV PACING THRESHOLD PULSEWIDTH: 0.4 ms
MDC IDC SET LEADCHNL RA PACING AMPLITUDE: 1.5 V
MDC IDC SET LEADCHNL RV SENSING SENSITIVITY: 0.3 mV
MDC IDC STAT BRADY AP VP PERCENT: 0.03 %
MDC IDC STAT BRADY AS VS PERCENT: 30.33 %
MDC IDC STAT BRADY RV PERCENT PACED: 0.05 %

## 2018-09-05 ENCOUNTER — Ambulatory Visit (INDEPENDENT_AMBULATORY_CARE_PROVIDER_SITE_OTHER): Payer: No Typology Code available for payment source

## 2018-09-05 DIAGNOSIS — I472 Ventricular tachycardia, unspecified: Secondary | ICD-10-CM

## 2018-09-05 LAB — CUP PACEART REMOTE DEVICE CHECK
Battery Remaining Longevity: 64 mo
Battery Voltage: 2.97 V
Brady Statistic AP VS Percent: 69.21 %
Brady Statistic AS VS Percent: 30.74 %
Brady Statistic RV Percent Paced: 0.05 %
HIGH POWER IMPEDANCE MEASURED VALUE: 64 Ohm
Implantable Lead Implant Date: 20160804
Implantable Lead Location: 753859
Implantable Pulse Generator Implant Date: 20160804
Lead Channel Impedance Value: 418 Ohm
Lead Channel Impedance Value: 475 Ohm
Lead Channel Pacing Threshold Amplitude: 0.625 V
Lead Channel Pacing Threshold Amplitude: 0.625 V
Lead Channel Pacing Threshold Pulse Width: 0.4 ms
Lead Channel Sensing Intrinsic Amplitude: 1 mV
Lead Channel Sensing Intrinsic Amplitude: 7.875 mV
Lead Channel Setting Pacing Amplitude: 2 V
MDC IDC LEAD IMPLANT DT: 20160804
MDC IDC LEAD LOCATION: 753860
MDC IDC MSMT LEADCHNL RA PACING THRESHOLD PULSEWIDTH: 0.4 ms
MDC IDC MSMT LEADCHNL RA SENSING INTR AMPL: 1 mV
MDC IDC MSMT LEADCHNL RV IMPEDANCE VALUE: 589 Ohm
MDC IDC MSMT LEADCHNL RV SENSING INTR AMPL: 7.875 mV
MDC IDC SESS DTM: 20200210150824
MDC IDC SET LEADCHNL RA PACING AMPLITUDE: 1.5 V
MDC IDC SET LEADCHNL RV PACING PULSEWIDTH: 0.4 ms
MDC IDC SET LEADCHNL RV SENSING SENSITIVITY: 0.3 mV
MDC IDC STAT BRADY AP VP PERCENT: 0.04 %
MDC IDC STAT BRADY AS VP PERCENT: 0.02 %
MDC IDC STAT BRADY RA PERCENT PACED: 69.16 %

## 2018-09-15 NOTE — Progress Notes (Signed)
Remote ICD transmission.   

## 2018-12-05 ENCOUNTER — Other Ambulatory Visit: Payer: Self-pay

## 2018-12-05 ENCOUNTER — Telehealth: Payer: Self-pay

## 2018-12-05 ENCOUNTER — Ambulatory Visit (INDEPENDENT_AMBULATORY_CARE_PROVIDER_SITE_OTHER): Payer: No Typology Code available for payment source | Admitting: *Deleted

## 2018-12-05 DIAGNOSIS — I472 Ventricular tachycardia, unspecified: Secondary | ICD-10-CM

## 2018-12-05 DIAGNOSIS — I469 Cardiac arrest, cause unspecified: Secondary | ICD-10-CM

## 2018-12-05 NOTE — Telephone Encounter (Signed)
Left message for patient to remind of missed remote transmission.  

## 2018-12-06 LAB — CUP PACEART REMOTE DEVICE CHECK
Battery Remaining Longevity: 63 mo
Battery Voltage: 2.97 V
Brady Statistic AP VP Percent: 0.03 %
Brady Statistic AP VS Percent: 63.64 %
Brady Statistic AS VP Percent: 0.02 %
Brady Statistic AS VS Percent: 36.32 %
Brady Statistic RA Percent Paced: 63.58 %
Brady Statistic RV Percent Paced: 0.05 %
Date Time Interrogation Session: 20200511052203
HighPow Impedance: 68 Ohm
Implantable Lead Implant Date: 20160804
Implantable Lead Implant Date: 20160804
Implantable Lead Location: 753859
Implantable Lead Location: 753860
Implantable Lead Model: 5076
Implantable Pulse Generator Implant Date: 20160804
Lead Channel Impedance Value: 418 Ohm
Lead Channel Impedance Value: 513 Ohm
Lead Channel Impedance Value: 589 Ohm
Lead Channel Pacing Threshold Amplitude: 0.625 V
Lead Channel Pacing Threshold Amplitude: 0.625 V
Lead Channel Pacing Threshold Pulse Width: 0.4 ms
Lead Channel Pacing Threshold Pulse Width: 0.4 ms
Lead Channel Sensing Intrinsic Amplitude: 1 mV
Lead Channel Sensing Intrinsic Amplitude: 1 mV
Lead Channel Sensing Intrinsic Amplitude: 7.75 mV
Lead Channel Sensing Intrinsic Amplitude: 7.75 mV
Lead Channel Setting Pacing Amplitude: 1.5 V
Lead Channel Setting Pacing Amplitude: 2 V
Lead Channel Setting Pacing Pulse Width: 0.4 ms
Lead Channel Setting Sensing Sensitivity: 0.3 mV

## 2018-12-08 ENCOUNTER — Telehealth: Payer: Self-pay

## 2018-12-08 NOTE — Telephone Encounter (Signed)
Copied from [place]. Topic: General - Other >> Dec 08, 2018 11:34 AM Ivar Drape wrote: Reason for CRM:   The patient would like to reschedule her 12/12/2018 appt for a physical.  She would like a morning appt.  Tried several times to reach the practice to no avail.

## 2018-12-12 ENCOUNTER — Encounter: Payer: 59 | Admitting: Family Medicine

## 2018-12-12 NOTE — Telephone Encounter (Signed)
Appt [facility]

## 2018-12-12 NOTE — Progress Notes (Signed)
Remote ICD transmission.   

## 2019-03-07 ENCOUNTER — Ambulatory Visit (INDEPENDENT_AMBULATORY_CARE_PROVIDER_SITE_OTHER): Payer: No Typology Code available for payment source | Admitting: *Deleted

## 2019-03-07 DIAGNOSIS — I422 Other hypertrophic cardiomyopathy: Secondary | ICD-10-CM | POA: Diagnosis not present

## 2019-03-07 DIAGNOSIS — I495 Sick sinus syndrome: Secondary | ICD-10-CM

## 2019-03-08 LAB — CUP PACEART REMOTE DEVICE CHECK
Battery Remaining Longevity: 60 mo
Battery Voltage: 2.97 V
Brady Statistic AP VP Percent: 0.04 %
Brady Statistic AP VS Percent: 67.05 %
Brady Statistic AS VP Percent: 0.02 %
Brady Statistic AS VS Percent: 32.89 %
Brady Statistic RA Percent Paced: 67.01 %
Brady Statistic RV Percent Paced: 0.06 %
Date Time Interrogation Session: 20200812052508
HighPow Impedance: 70 Ohm
Implantable Lead Implant Date: 20160804
Implantable Lead Implant Date: 20160804
Implantable Lead Location: 753859
Implantable Lead Location: 753860
Implantable Lead Model: 5076
Implantable Pulse Generator Implant Date: 20160804
Lead Channel Impedance Value: 418 Ohm
Lead Channel Impedance Value: 532 Ohm
Lead Channel Impedance Value: 608 Ohm
Lead Channel Pacing Threshold Amplitude: 0.5 V
Lead Channel Pacing Threshold Amplitude: 0.625 V
Lead Channel Pacing Threshold Pulse Width: 0.4 ms
Lead Channel Pacing Threshold Pulse Width: 0.4 ms
Lead Channel Sensing Intrinsic Amplitude: 1.125 mV
Lead Channel Sensing Intrinsic Amplitude: 1.125 mV
Lead Channel Sensing Intrinsic Amplitude: 7.375 mV
Lead Channel Sensing Intrinsic Amplitude: 7.375 mV
Lead Channel Setting Pacing Amplitude: 1.5 V
Lead Channel Setting Pacing Amplitude: 2 V
Lead Channel Setting Pacing Pulse Width: 0.4 ms
Lead Channel Setting Sensing Sensitivity: 0.3 mV

## 2019-03-16 NOTE — Progress Notes (Signed)
Remote ICD transmission.   

## 2019-04-04 ENCOUNTER — Encounter: Payer: Self-pay | Admitting: Family Medicine

## 2019-04-04 ENCOUNTER — Other Ambulatory Visit: Payer: Self-pay

## 2019-04-04 ENCOUNTER — Ambulatory Visit (INDEPENDENT_AMBULATORY_CARE_PROVIDER_SITE_OTHER): Payer: No Typology Code available for payment source | Admitting: Family Medicine

## 2019-04-04 VITALS — BP 102/78 | HR 70 | Temp 97.5°F | Resp 18 | Ht 63.0 in | Wt 155.2 lb

## 2019-04-04 DIAGNOSIS — Z23 Encounter for immunization: Secondary | ICD-10-CM

## 2019-04-04 DIAGNOSIS — Z8674 Personal history of sudden cardiac arrest: Secondary | ICD-10-CM | POA: Diagnosis not present

## 2019-04-04 DIAGNOSIS — I421 Obstructive hypertrophic cardiomyopathy: Secondary | ICD-10-CM | POA: Diagnosis not present

## 2019-04-04 DIAGNOSIS — Z0000 Encounter for general adult medical examination without abnormal findings: Secondary | ICD-10-CM | POA: Diagnosis not present

## 2019-04-04 LAB — CBC WITH DIFFERENTIAL/PLATELET
Basophils Absolute: 0.1 10*3/uL (ref 0.0–0.1)
Basophils Relative: 0.8 % (ref 0.0–3.0)
Eosinophils Absolute: 0.3 10*3/uL (ref 0.0–0.7)
Eosinophils Relative: 4.4 % (ref 0.0–5.0)
HCT: 42.5 % (ref 36.0–46.0)
Hemoglobin: 14.2 g/dL (ref 12.0–15.0)
Lymphocytes Relative: 37.9 % (ref 12.0–46.0)
Lymphs Abs: 2.9 10*3/uL (ref 0.7–4.0)
MCHC: 33.3 g/dL (ref 30.0–36.0)
MCV: 86.1 fl (ref 78.0–100.0)
Monocytes Absolute: 0.6 10*3/uL (ref 0.1–1.0)
Monocytes Relative: 7.5 % (ref 3.0–12.0)
Neutro Abs: 3.8 10*3/uL (ref 1.4–7.7)
Neutrophils Relative %: 49.4 % (ref 43.0–77.0)
Platelets: 237 10*3/uL (ref 150.0–400.0)
RBC: 4.94 Mil/uL (ref 3.87–5.11)
RDW: 12.7 % (ref 11.5–15.5)
WBC: 7.8 10*3/uL (ref 4.0–10.5)

## 2019-04-04 LAB — COMPREHENSIVE METABOLIC PANEL
ALT: 71 U/L — ABNORMAL HIGH (ref 0–35)
AST: 34 U/L (ref 0–37)
Albumin: 4.4 g/dL (ref 3.5–5.2)
Alkaline Phosphatase: 102 U/L (ref 39–117)
BUN: 11 mg/dL (ref 6–23)
CO2: 29 mEq/L (ref 19–32)
Calcium: 9.8 mg/dL (ref 8.4–10.5)
Chloride: 100 mEq/L (ref 96–112)
Creatinine, Ser: 0.72 mg/dL (ref 0.40–1.20)
GFR: 84.73 mL/min (ref >60.00)
Glucose, Bld: 91 mg/dL (ref 70–99)
Potassium: 4.1 mEq/L (ref 3.5–5.1)
Sodium: 137 mEq/L (ref 135–145)
Total Bilirubin: 0.5 mg/dL (ref 0.2–1.2)
Total Protein: 6.8 g/dL (ref 6.0–8.3)

## 2019-04-04 LAB — LIPID PANEL
Cholesterol: 200 mg/dL (ref 0–200)
HDL: 37.8 mg/dL — ABNORMAL LOW (ref >39.00)
LDL Cholesterol: 124 mg/dL — ABNORMAL HIGH (ref 0–99)
NonHDL: 162.57
Total CHOL/HDL Ratio: 5
Triglycerides: 193 mg/dL — ABNORMAL HIGH (ref 0.0–149.0)
VLDL: 38.6 mg/dL (ref 0.0–40.0)

## 2019-04-04 LAB — TSH: TSH: 1.08 u[IU]/mL (ref 0.35–4.50)

## 2019-04-04 NOTE — Progress Notes (Signed)
Subjective:     Teresa Henry is a 53 y.o. female and is here for a comprehensive physical exam. The patient reports no problems.  Social History   Socioeconomic History  . Marital status: Married    Spouse name: Not on file  . Number of children: Not on file  . Years of education: Not on file  . Highest education level: Not on file  Occupational History  . Occupation: Agricultural engineer-- self employed  Social Needs  . Financial resource strain: Not on file  . Food insecurity    Worry: Not on file    Inability: Not on file  . Transportation needs    Medical: Not on file    Non-medical: Not on file  Tobacco Use  . Smoking status: Never Smoker  . Smokeless tobacco: Never Used  Substance and Sexual Activity  . Alcohol use: No  . Drug use: No  . Sexual activity: Yes    Partners: Male  Lifestyle  . Physical activity    Days per week: Not on file    Minutes per session: Not on file  . Stress: Not on file  Relationships  . Social Herbalist on phone: Not on file    Gets together: Not on file    Attends religious service: Not on file    Active member of club or organization: Not on file    Attends meetings of clubs or organizations: Not on file    Relationship status: Not on file  . Intimate partner violence    Fear of current or ex partner: Not on file    Emotionally abused: Not on file    Physically abused: Not on file    Forced sexual activity: Not on file  Other Topics Concern  . Not on file  Social History Narrative  . Not on file   Health Maintenance  Topic Date Due  . MAMMOGRAM  10/13/2016  . PAP SMEAR-Modifier  10/13/2016  . COLONOSCOPY  04/20/2021  . TETANUS/TDAP  04/19/2022  . INFLUENZA VACCINE  Completed  . HIV Screening  Completed    The following portions of the patient's history were reviewed and updated as appropriate:  She  has a past medical history of Cardiac arrest ([place]), Frequent episodic tension-type headache, and Prolonged  Q-T interval on ECG. She does not have any pertinent problems on file. She  has a past surgical history that includes Breast biopsy (1999); Cardiac catheterization (N/A, 02/28/2015); and Cardiac catheterization (N/A, 02/28/2015). Her family history includes Arthritis in her father and mother; Colon polyps in her father and mother; Heart attack (age of onset: 58) in her brother; Hypertension in her father and mother; Stroke in her maternal grandmother and paternal grandmother. She  reports that she has never smoked. She has never used smokeless tobacco. She reports that she does not drink alcohol or use drugs. She has a current medication list which includes the following prescription(s): aspirin, b complex vitamins, krill oil, multivitamin, nadolol, and vitamin c. Current Outpatient Medications on File Prior to Visit  Medication Sig Dispense Refill  . aspirin 81 MG tablet Take 81 mg by mouth daily.    Marland Kitchen b complex vitamins tablet Take 1 tablet by mouth daily.    Javier Docker Oil 500 MG CAPS Take by mouth.    . Multiple Vitamin (MULTIVITAMIN) capsule Take 1 capsule by mouth daily.    . nadolol (CORGARD) 40 MG tablet TAKE ONE AND ONE-HALF TABLETS (60 MG TOTAL)  BY MOUTH 2 (TWO) TIMES DAILY. 270 tablet 3  . vitamin C (ASCORBIC ACID) 500 MG tablet Take 500 mg by mouth daily.     No current facility-administered medications on file prior to visit.    She has No Known Allergies..  Review of Systems Review of Systems  Constitutional: Negative for activity change, appetite change and fatigue.  HENT: Negative for hearing loss, congestion, tinnitus and ear discharge.  dentist q80m Eyes: Negative for visual disturbance (see optho q1y -- vision corrected to 20/20 with glasses).  Respiratory: Negative for cough, chest tightness and shortness of breath.   Cardiovascular: Negative for chest pain, palpitations and leg swelling.  Gastrointestinal: Negative for abdominal pain, diarrhea, constipation and abdominal  distention.  Genitourinary: Negative for urgency, frequency, decreased urine volume and difficulty urinating.  Musculoskeletal: Negative for back pain, arthralgias and gait problem.  Skin: Negative for color change, pallor and rash.  Neurological: Negative for dizziness, light-headedness, numbness and headaches.  Hematological: Negative for adenopathy. Does not bruise/bleed easily.  Psychiatric/Behavioral: Negative for suicidal ideas, confusion, sleep disturbance, self-injury, dysphoric mood, decreased concentration and agitation.                                                         Objective:    BP 102/78 (BP Location: Right Arm, Patient Position: Sitting, Cuff Size: Normal)   Pulse 70   Temp (!) 97.5 F (36.4 C) (Temporal)   Resp 18   Ht 5\' 3"  (1.6 m)   Wt 155 lb 3.2 oz (70.4 kg)   LMP 02/25/2019   SpO2 98%   BMI 27.49 kg/m  General appearance: alert, cooperative, appears stated age and no distress Head: Normocephalic, without obvious abnormality, atraumatic Eyes: negative findings: lids and lashes normal, conjunctivae and sclerae normal and pupils equal, round, reactive to light and accomodation Ears: normal TM's and external ear canals both ears Nose: Nares normal. Septum midline. Mucosa normal. No drainage or sinus tenderness. Throat: lips, mucosa, and tongue normal; teeth and gums normal Neck: no adenopathy, no carotid bruit, no JVD, supple, symmetrical, trachea midline and thyroid not enlarged, symmetric, no tenderness/mass/nodules Back: symmetric, no curvature. ROM normal. No CVA tenderness. Lungs: clear to auscultation bilaterally Breasts: normal appearance, no masses or tenderness Heart: regular rate and rhythm, S1, S2 normal, no murmur, click, rub or gallop Abdomen: soft, non-tender; bowel sounds normal; no masses,  no organomegaly Pelvic: deferred --gyn Extremities: extremities normal, atraumatic, no cyanosis or  edema Pulses: 2+ and symmetric Skin: Skin color, texture, turgor normal. No rashes or lesions Lymph nodes: Cervical, supraclavicular, and axillary nodes normal. Neurologic: Alert and oriented X 3, normal strength and tone. Normal symmetric reflexes. Normal coordination and gait    Assessment:    Healthy female exam.      Plan:    ghm utd Check labs  See After Visit Summary for Counseling Recommendations  \\    1. Need for influenza vaccination  - Flu Vaccine QUAD 6+ mos PF IM (Fluarix Quad PF)  2. Preventative health care See above - Lipid panel - CBC with Differential/Platelet - TSH - Comprehensive metabolic panel Pt had flu shot today She will get shigrix later  3. History of cardiac arrest Per cardiology  4. 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

## 2019-04-04 NOTE — Patient Instructions (Signed)

## 2019-04-05 ENCOUNTER — Other Ambulatory Visit: Payer: Self-pay | Admitting: Family Medicine

## 2019-04-05 DIAGNOSIS — E785 Hyperlipidemia, unspecified: Secondary | ICD-10-CM

## 2019-04-06 ENCOUNTER — Other Ambulatory Visit: Payer: Self-pay | Admitting: Internal Medicine

## 2019-04-06 MED ORDER — NADOLOL 40 MG PO TABS: ORAL_TABLET | ORAL | 0 refills | Status: DC

## 2019-05-11 ENCOUNTER — Encounter: Payer: Self-pay | Admitting: Family Medicine

## 2019-05-17 ENCOUNTER — Other Ambulatory Visit (INDEPENDENT_AMBULATORY_CARE_PROVIDER_SITE_OTHER): Payer: No Typology Code available for payment source

## 2019-05-17 ENCOUNTER — Other Ambulatory Visit: Payer: Self-pay

## 2019-05-17 DIAGNOSIS — E785 Hyperlipidemia, unspecified: Secondary | ICD-10-CM

## 2019-05-17 DIAGNOSIS — Z0184 Encounter for antibody response examination: Secondary | ICD-10-CM

## 2019-05-17 LAB — COMPREHENSIVE METABOLIC PANEL
ALT: 52 U/L — ABNORMAL HIGH (ref 0–35)
AST: 33 U/L (ref 0–37)
Albumin: 4.3 g/dL (ref 3.5–5.2)
Alkaline Phosphatase: 138 U/L — ABNORMAL HIGH (ref 39–117)
BUN: 9 mg/dL (ref 6–23)
CO2: 30 mEq/L (ref 19–32)
Calcium: 9.7 mg/dL (ref 8.4–10.5)
Chloride: 101 mEq/L (ref 96–112)
Creatinine, Ser: 0.73 mg/dL (ref 0.40–1.20)
GFR: 83.35 mL/min (ref >60.00)
Glucose, Bld: 74 mg/dL (ref 70–99)
Potassium: 3.9 mEq/L (ref 3.5–5.1)
Sodium: 140 mEq/L (ref 135–145)
Total Bilirubin: 0.4 mg/dL (ref 0.2–1.2)
Total Protein: 6.8 g/dL (ref 6.0–8.3)

## 2019-05-17 LAB — LIPID PANEL
Cholesterol: 178 mg/dL (ref 0–200)
HDL: 32.1 mg/dL — ABNORMAL LOW (ref >39.00)
NonHDL: 145.72
Total CHOL/HDL Ratio: 6
Triglycerides: 235 mg/dL — ABNORMAL HIGH (ref 0.0–149.0)
VLDL: 47 mg/dL — ABNORMAL HIGH (ref 0.0–40.0)

## 2019-05-17 LAB — LDL CHOLESTEROL, DIRECT: Direct LDL: 125 mg/dL

## 2019-05-24 ENCOUNTER — Other Ambulatory Visit: Payer: Self-pay | Admitting: Family Medicine

## 2019-05-24 ENCOUNTER — Encounter: Payer: Self-pay | Admitting: Family Medicine

## 2019-05-24 DIAGNOSIS — R748 Abnormal levels of other serum enzymes: Secondary | ICD-10-CM

## 2019-05-24 NOTE — Telephone Encounter (Signed)
The antibody test was not done--- can it be added ?   She called the office asking for the test and there were future orders in for other labs so I'm guessing that is why they were done but I never received the request for the antibody test

## 2019-05-25 ENCOUNTER — Telehealth: Payer: Self-pay

## 2019-05-25 NOTE — Telephone Encounter (Signed)
Called patient and went over labs with her.  Patient was upset because she was only coming in for a COVID antibody test and not other labs.  Lab had thrown out her labs and I wasn't able to add the additional lab.   Patient is coming back in 2 weeks to get labs drawn along with COVID antibody test.

## 2019-05-25 NOTE — Telephone Encounter (Signed)
Phone note placed in chart addressing antibody test

## 2019-05-25 NOTE — Progress Notes (Signed)
error 

## 2019-05-25 NOTE — Addendum Note (Signed)
Addended by: Marlene Bast A on: 05/25/2019 02:29 PM   Modules accepted: Orders

## 2019-05-26 ENCOUNTER — Telehealth: Payer: Self-pay

## 2019-05-26 NOTE — Telephone Encounter (Signed)
Copied from [place] [id number]. Topic: General - Other >> May 24, 2019  4:28 PM Sheran Luz wrote: Patient requesting call back from [facility] to discuss MyChart message sent today.

## 2019-06-06 ENCOUNTER — Encounter: Payer: Self-pay | Admitting: Internal Medicine

## 2019-06-06 ENCOUNTER — Ambulatory Visit: Payer: No Typology Code available for payment source | Admitting: Internal Medicine

## 2019-06-06 ENCOUNTER — Other Ambulatory Visit: Payer: Self-pay

## 2019-06-06 VITALS — BP 106/68 | HR 77 | Ht 63.0 in | Wt 151.6 lb

## 2019-06-06 DIAGNOSIS — I495 Sick sinus syndrome: Secondary | ICD-10-CM

## 2019-06-06 DIAGNOSIS — I422 Other hypertrophic cardiomyopathy: Secondary | ICD-10-CM

## 2019-06-06 DIAGNOSIS — Z95810 Presence of automatic (implantable) cardiac defibrillator: Secondary | ICD-10-CM | POA: Diagnosis not present

## 2019-06-06 DIAGNOSIS — I469 Cardiac arrest, cause unspecified: Secondary | ICD-10-CM

## 2019-06-06 NOTE — Progress Notes (Signed)
HPI Teresa Henry returns today for followup. She is a18yo woman with a h/o syncope who was found to have VF and successfully defibrillated while in the hospital over a yearago. Her QT interval was increased and she was given a presumptive diagnosis of long QT. She was noted to have an LV thickness of nearly 16 mm. She also had some sinus bradycardia. She underwent insertion of a DDD ICD and has undergone genetic testing which failed to reveal a QT prolonging gene and most recently no evidence of HCM. In the interim she has done well. 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 No Known Allergies   Current Outpatient Medications  Medication Sig Dispense Refill  . aspirin 81 MG tablet Take 81 mg by mouth daily.    Marland Kitchen b complex vitamins tablet Take 1 tablet by mouth daily.    Javier Docker Oil 500 MG CAPS Take by mouth.    . Multiple Vitamin (MULTIVITAMIN) capsule Take 1 capsule by mouth daily.    . nadolol (CORGARD) 40 MG tablet TAKE ONE AND ONE-HALF TABLETS (60 MG TOTAL) BY MOUTH 2 (TWO) TIMES DAILY. Please keep upcoming appt in November with Dr. Lovena Le for future refills. Thank you 270 tablet 0  . vitamin C (ASCORBIC ACID) 500 MG tablet Take 500 mg by mouth daily.     No current facility-administered medications for this visit.      Past Medical History:  Diagnosis Date  . Cardiac arrest [facility])    a. s/p MDT dual chamber ICD  . Frequent episodic tension-type headache   . Prolonged Q-T interval on ECG     ROS:   All systems reviewed and negative except as noted in the HPI.   Past Surgical History:  Procedure Laterality Date  . BREAST BIOPSY  1999   Needle Biopsy- Non cancer  . CARDIAC CATHETERIZATION N/A 02/28/2015   Procedure: Left Heart Cath and Coronary Angiography;  Surgeon: Belva Crome, MD;  Location: [place] [facility];  Service: Cardiovascular;  Laterality: N/A;  . EP IMPLANTABLE DEVICE N/A 02/28/2015   MDT dual chamber ICD implanted by Dr  Rayann Heman for secondary prevention     Family History  Problem Relation Age of Onset  . Arthritis Mother   . Hypertension Mother   . Colon polyps Mother   . Arthritis Father   . Hypertension Father   . Colon polyps Father   . Heart attack Brother 41  . Stroke Maternal Grandmother   . Stroke Paternal Grandmother   . Colon cancer Neg Hx   . Esophageal cancer Neg Hx   . Rectal cancer Neg Hx   . Stomach cancer Neg Hx      Social History   Socioeconomic History  . Marital status: Married    Spouse name: Not on file  . Number of children: Not on file  . Years of education: Not on file  . Highest education level: Not on file  Occupational History  . Occupation: Agricultural engineer-- self employed  Social Needs  . Financial resource strain: Not on file  . Food insecurity    Worry: Not on file    Inability: Not on file  . Transportation needs    Medical: Not on file    Non-medical: Not on file  Tobacco Use  . Smoking status: Never Smoker  . Smokeless tobacco: Never Used  Substance and Sexual Activity  . Alcohol use: No  . Drug use: No  .  Sexual activity: Yes    Partners: Male  Lifestyle  . Physical activity    Days per week: Not on file    Minutes per session: Not on file  . Stress: Not on file  Relationships  . Social Herbalist on phone: Not on file    Gets together: Not on file    Attends religious service: Not on file    Active member of club or organization: Not on file    Attends meetings of clubs or organizations: Not on file    Relationship status: Not on file  . Intimate partner violence    Fear of current or ex partner: Not on file    Emotionally abused: Not on file    Physically abused: Not on file    Forced sexual activity: Not on file  Other Topics Concern  . Not on file  Social History Narrative  . Not on file     BP 106/68   Pulse 77   Ht 5\' 3"  (1.6 m)   Wt 151 lb 9.6 oz (68.8 kg)   SpO2 97%   BMI 26.85 kg/m   Physical  Exam:  Well appearing NAD HEENT: Unremarkable Neck:  No JVD, no thyromegally Lymphatics:  No adenopathy Back:  No CVA tenderness Lungs:  Clear with no wheezes HEART:  Regular rate rhythm, no murmurs, no rubs, no clicks Abd:  soft, positive bowel sounds, no organomegally, no rebound, no guarding Ext:  2 plus pulses, no edema, no cyanosis, no clubbing Skin:  No rashes no nodules Neuro:  CN II through XII intact, motor grossly intact  EKG - nsr with atrial pacing  DEVICE  Normal device function.  See PaceArt for details.   Assess/Plan: 1. 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 She has not had any additional arrhythmia 2. ICD - her medtronic DDD ICD is working normally.  3. HCM - she is asymptomatic. She will continue her current meds.   Mikle [email or web address].D.

## 2019-06-06 NOTE — Patient Instructions (Signed)
Medication Instructions:  Your physician recommends that you continue on your current medications as directed. Please refer to the Current Medication list given to you today.  Labwork: None ordered.  Testing/Procedures: None ordered.  Follow-Up: Your physician wants you to follow-up in: one year with Dr. Lovena Le.  You will receive a reminder letter in the mail two months in advance. If you don't receive a letter, please call our office to schedule the follow-up appointment.  Remote monitoring is used to monitor your ICD from home. This monitoring reduces the number of office visits required to check your device to one time per year. It allows [place] to keep an eye on the functioning of your device to ensure it is working properly. You are scheduled for a device check from home on 06/07/2019. You may send your transmission at any time that day. If you have a wireless device, the transmission will be sent automatically. After your physician reviews your transmission, you will receive a postcard with your next transmission date.  Any Other Special Instructions Will Be Listed Below (If Applicable).  If you need a refill on your cardiac medications before your next appointment, please call your pharmacy.

## 2019-06-07 ENCOUNTER — Ambulatory Visit (INDEPENDENT_AMBULATORY_CARE_PROVIDER_SITE_OTHER): Payer: No Typology Code available for payment source | Admitting: *Deleted

## 2019-06-07 DIAGNOSIS — I469 Cardiac arrest, cause unspecified: Secondary | ICD-10-CM

## 2019-06-07 DIAGNOSIS — I495 Sick sinus syndrome: Secondary | ICD-10-CM

## 2019-06-07 LAB — CUP PACEART REMOTE DEVICE CHECK
Battery Remaining Longevity: 57 mo
Battery Voltage: 2.96 V
Brady Statistic AP VP Percent: 0.04 %
Brady Statistic AP VS Percent: 69.1 %
Brady Statistic AS VP Percent: 0.01 %
Brady Statistic AS VS Percent: 30.85 %
Brady Statistic RA Percent Paced: 69.12 %
Brady Statistic RV Percent Paced: 0.05 %
Date Time Interrogation Session: 20201111050221
HighPow Impedance: 69 Ohm
Implantable Lead Implant Date: 20160804
Implantable Lead Implant Date: 20160804
Implantable Lead Location: 753859
Implantable Lead Location: 753860
Implantable Lead Model: 5076
Implantable Pulse Generator Implant Date: 20160804
Lead Channel Impedance Value: 456 Ohm
Lead Channel Impedance Value: 475 Ohm
Lead Channel Impedance Value: 551 Ohm
Lead Channel Pacing Threshold Amplitude: 0.625 V
Lead Channel Pacing Threshold Amplitude: 0.625 V
Lead Channel Pacing Threshold Pulse Width: 0.4 ms
Lead Channel Pacing Threshold Pulse Width: 0.4 ms
Lead Channel Sensing Intrinsic Amplitude: 1 mV
Lead Channel Sensing Intrinsic Amplitude: 1 mV
Lead Channel Sensing Intrinsic Amplitude: 8.125 mV
Lead Channel Sensing Intrinsic Amplitude: 8.125 mV
Lead Channel Setting Pacing Amplitude: 1.5 V
Lead Channel Setting Pacing Amplitude: 2 V
Lead Channel Setting Pacing Pulse Width: 0.4 ms
Lead Channel Setting Sensing Sensitivity: 0.3 mV

## 2019-06-08 ENCOUNTER — Other Ambulatory Visit: Payer: Self-pay

## 2019-06-08 ENCOUNTER — Ambulatory Visit: Payer: No Typology Code available for payment source

## 2019-06-08 ENCOUNTER — Other Ambulatory Visit (INDEPENDENT_AMBULATORY_CARE_PROVIDER_SITE_OTHER): Payer: No Typology Code available for payment source

## 2019-06-08 DIAGNOSIS — R748 Abnormal levels of other serum enzymes: Secondary | ICD-10-CM

## 2019-06-08 DIAGNOSIS — Z0184 Encounter for antibody response examination: Secondary | ICD-10-CM | POA: Diagnosis not present

## 2019-06-08 LAB — COMPREHENSIVE METABOLIC PANEL
ALT: 38 U/L — ABNORMAL HIGH (ref 0–35)
AST: 28 U/L (ref 0–37)
Albumin: 4.4 g/dL (ref 3.5–5.2)
Alkaline Phosphatase: 89 U/L (ref 39–117)
BUN: 9 mg/dL (ref 6–23)
CO2: 28 mEq/L (ref 19–32)
Calcium: 9.2 mg/dL (ref 8.4–10.5)
Chloride: 103 mEq/L (ref 96–112)
Creatinine, Ser: 0.79 mg/dL (ref 0.40–1.20)
GFR: 76.07 mL/min (ref >60.00)
Glucose, Bld: 104 mg/dL — ABNORMAL HIGH (ref 70–99)
Potassium: 4 mEq/L (ref 3.5–5.1)
Sodium: 139 mEq/L (ref 135–145)
Total Bilirubin: 0.4 mg/dL (ref 0.2–1.2)
Total Protein: 6.7 g/dL (ref 6.0–8.3)

## 2019-06-08 LAB — SARS-COV-2 IGG: SARS-COV-2 IgG: 51.53

## 2019-06-08 LAB — GAMMA GT: GGT: 52 U/L — ABNORMAL HIGH (ref 7–51)

## 2019-06-09 LAB — HEPATITIS PANEL, ACUTE
Hep A IgM: NONREACTIVE
Hep B C IgM: NONREACTIVE
Hepatitis B Surface Ag: NONREACTIVE
Hepatitis C Ab: NONREACTIVE
SIGNAL TO CUT-OFF: 0.01 (ref <1.00)

## 2019-06-16 ENCOUNTER — Other Ambulatory Visit: Payer: Self-pay | Admitting: Family Medicine

## 2019-06-16 ENCOUNTER — Encounter: Payer: Self-pay | Admitting: Family Medicine

## 2019-06-16 DIAGNOSIS — R748 Abnormal levels of other serum enzymes: Secondary | ICD-10-CM

## 2019-06-19 NOTE — Telephone Encounter (Signed)
That is possible-- what exactly was she taking?

## 2019-06-20 NOTE — Telephone Encounter (Signed)
Echinacea can elevated liver enzymes

## 2019-06-28 ENCOUNTER — Encounter: Payer: Self-pay | Admitting: Family Medicine

## 2019-06-28 NOTE — Progress Notes (Signed)
Remote ICD transmission.   

## 2019-07-08 ENCOUNTER — Other Ambulatory Visit: Payer: Self-pay | Admitting: Internal Medicine

## 2019-08-03 LAB — CUP PACEART INCLINIC DEVICE CHECK
Date Time Interrogation Session: 20201110140409
Implantable Lead Implant Date: 20160804
Implantable Lead Implant Date: 20160804
Implantable Lead Location: 753859
Implantable Lead Location: 753860
Implantable Lead Model: 5076
Implantable Pulse Generator Implant Date: 20160804

## 2019-09-06 ENCOUNTER — Ambulatory Visit (INDEPENDENT_AMBULATORY_CARE_PROVIDER_SITE_OTHER): Payer: No Typology Code available for payment source | Admitting: *Deleted

## 2019-09-06 DIAGNOSIS — I495 Sick sinus syndrome: Secondary | ICD-10-CM | POA: Diagnosis not present

## 2019-09-07 LAB — CUP PACEART REMOTE DEVICE CHECK
Battery Remaining Longevity: 47 mo
Battery Voltage: 2.96 V
Brady Statistic AP VP Percent: 0.03 %
Brady Statistic AP VS Percent: 72.44 %
Brady Statistic AS VP Percent: 0.01 %
Brady Statistic AS VS Percent: 27.51 %
Brady Statistic RA Percent Paced: 72.43 %
Brady Statistic RV Percent Paced: 0.05 %
Date Time Interrogation Session: 20210211022727
HighPow Impedance: 71 Ohm
Implantable Lead Implant Date: 20160804
Implantable Lead Implant Date: 20160804
Implantable Lead Location: 753859
Implantable Lead Location: 753860
Implantable Lead Model: 5076
Implantable Pulse Generator Implant Date: 20160804
Lead Channel Impedance Value: 456 Ohm
Lead Channel Impedance Value: 532 Ohm
Lead Channel Impedance Value: 608 Ohm
Lead Channel Pacing Threshold Amplitude: 0.625 V
Lead Channel Pacing Threshold Amplitude: 0.625 V
Lead Channel Pacing Threshold Pulse Width: 0.4 ms
Lead Channel Pacing Threshold Pulse Width: 0.4 ms
Lead Channel Sensing Intrinsic Amplitude: 1.375 mV
Lead Channel Sensing Intrinsic Amplitude: 1.375 mV
Lead Channel Sensing Intrinsic Amplitude: 7.875 mV
Lead Channel Sensing Intrinsic Amplitude: 7.875 mV
Lead Channel Setting Pacing Amplitude: 1.5 V
Lead Channel Setting Pacing Amplitude: 2 V
Lead Channel Setting Pacing Pulse Width: 0.4 ms
Lead Channel Setting Sensing Sensitivity: 0.3 mV

## 2019-09-07 NOTE — Progress Notes (Signed)
ICD Remote  

## 2019-12-06 ENCOUNTER — Telehealth: Payer: Self-pay

## 2019-12-06 ENCOUNTER — Ambulatory Visit (INDEPENDENT_AMBULATORY_CARE_PROVIDER_SITE_OTHER): Payer: No Typology Code available for payment source | Admitting: *Deleted

## 2019-12-06 DIAGNOSIS — I495 Sick sinus syndrome: Secondary | ICD-10-CM | POA: Diagnosis not present

## 2019-12-06 NOTE — Telephone Encounter (Signed)
Spoke with patient to remind of missed remote transmission 

## 2019-12-07 LAB — CUP PACEART REMOTE DEVICE CHECK
Battery Remaining Longevity: 45 mo
Battery Voltage: 2.94 V
Brady Statistic AP VP Percent: 0.04 %
Brady Statistic AP VS Percent: 75.01 %
Brady Statistic AS VP Percent: 0.02 %
Brady Statistic AS VS Percent: 24.94 %
Brady Statistic RA Percent Paced: 74.98 %
Brady Statistic RV Percent Paced: 0.05 %
Date Time Interrogation Session: 20210512175123
HighPow Impedance: 70 Ohm
Implantable Lead Implant Date: 20160804
Implantable Lead Implant Date: 20160804
Implantable Lead Location: 753859
Implantable Lead Location: 753860
Implantable Lead Model: 5076
Implantable Pulse Generator Implant Date: 20160804
Lead Channel Impedance Value: 456 Ohm
Lead Channel Impedance Value: 513 Ohm
Lead Channel Impedance Value: 589 Ohm
Lead Channel Pacing Threshold Amplitude: 0.5 V
Lead Channel Pacing Threshold Amplitude: 0.75 V
Lead Channel Pacing Threshold Pulse Width: 0.4 ms
Lead Channel Pacing Threshold Pulse Width: 0.4 ms
Lead Channel Sensing Intrinsic Amplitude: 1 mV
Lead Channel Sensing Intrinsic Amplitude: 1 mV
Lead Channel Sensing Intrinsic Amplitude: 8.125 mV
Lead Channel Sensing Intrinsic Amplitude: 8.125 mV
Lead Channel Setting Pacing Amplitude: 1.5 V
Lead Channel Setting Pacing Amplitude: 2 V
Lead Channel Setting Pacing Pulse Width: 0.4 ms
Lead Channel Setting Sensing Sensitivity: 0.3 mV

## 2019-12-08 NOTE — Progress Notes (Signed)
Remote ICD transmission.   

## 2020-02-26 ENCOUNTER — Other Ambulatory Visit: Payer: Self-pay

## 2020-02-26 ENCOUNTER — Encounter: Payer: Self-pay | Admitting: Internal Medicine

## 2020-02-26 ENCOUNTER — Ambulatory Visit: Payer: No Typology Code available for payment source | Admitting: Internal Medicine

## 2020-02-26 VITALS — BP 129/85 | HR 80 | Temp 98.6°F | Resp 18 | Ht 63.0 in | Wt 152.1 lb

## 2020-02-26 DIAGNOSIS — R102 Pelvic and perineal pain: Secondary | ICD-10-CM | POA: Diagnosis not present

## 2020-02-26 LAB — POC URINALSYSI DIPSTICK (AUTOMATED)
Bilirubin, UA: NEGATIVE
Glucose, UA: NEGATIVE
Ketones, UA: NEGATIVE
Leukocytes, UA: NEGATIVE
Nitrite, UA: NEGATIVE
Protein, UA: NEGATIVE
Spec Grav, UA: 1.01 (ref 1.010–1.025)
Urobilinogen, UA: 0.2 E.U./dL
pH, UA: 7 (ref 5.0–8.0)

## 2020-02-26 NOTE — Progress Notes (Signed)
Pre visit review using our clinic review tool, if applicable. No additional management support is needed unless otherwise documented below in the visit note. 

## 2020-02-26 NOTE — Progress Notes (Signed)
Subjective:    Patient ID: Teresa Henry, female    DOB: [birth year], 54 y.o.   MRN: [id number]  DOS:  02/26/2020 Type of visit - description: Acute Symptoms started 2 days ago with pain at the suprapubic area.  Pain is a steady, no radiation except today she feels that the pain is going towards the back.    Review of Systems Denies any fever chills No nausea or diarrhea but today she had a single episode of vomiting. Last bowel movement was today, stools normal in color with no blood. No dysuria, gross hematuria difficulty urinating or urinary frequency She is menopausal, has hot flashes, over the last year she had only 3 menses. Last menses was 09-2019. Currently with no spotting or discharge.  Past Medical History:  Diagnosis Date  . Cardiac arrest [facility])    a. s/p MDT dual chamber ICD  . Frequent episodic tension-type headache   . Prolonged Q-T interval on ECG     Past Surgical History:  Procedure Laterality Date  . BREAST BIOPSY  1999   Needle Biopsy- Non cancer  . CARDIAC CATHETERIZATION N/A 02/28/2015   Procedure: Left Heart Cath and Coronary Angiography;  Surgeon: Belva Crome, MD;  Location: [place] [facility];  Service: Cardiovascular;  Laterality: N/A;  . EP IMPLANTABLE DEVICE N/A 02/28/2015   MDT dual chamber ICD implanted by Dr Rayann Heman for secondary prevention    Allergies as of 02/26/2020   No Known Allergies     Medication List       Accurate as of February 26, 2020 11:59 PM. If you have any questions, ask your nurse or doctor.        aspirin 81 MG tablet Take 81 mg by mouth daily.   b complex vitamins tablet Take 1 tablet by mouth daily.   Krill Oil 500 MG Caps Take by mouth.   multivitamin capsule Take 1 capsule by mouth daily.   nadolol 40 MG tablet Commonly known as: CORGARD TAKE ONE & ONE-HALF TABLETS (60 MG) TWICE DAILY PLEASE KEEP APPT IN NOVEMBER FOR FUTURE REFILLS   vitamin C 500 MG tablet Commonly known as: ASCORBIC ACID Take 500 mg by  mouth daily.          Objective:   Physical Exam BP 129/85 (BP Location: Left Arm, Patient Position: Sitting, Cuff Size: Small)   Pulse 80   Temp 98.6 F (37 C) (Oral)   Resp 18   Ht 5\' 3"  (1.6 m)   Wt 152 lb 2 oz (69 kg)   SpO2 98%   BMI 26.95 kg/m   General:   Well developed, NAD, BMI noted.  HEENT:  Normocephalic . Face symmetric, atraumatic Lungs:  CTA B Normal respiratory effort, no intercostal retractions, no accessory muscle use. Heart: RRR,  no murmur.  Abdomen:  Not distended, soft, non-tender. No rebound or rigidity. Pelvic exam:  External examination without lesions. Bimanual: No adnexal mass or CMT. Cervix: No lesion, no discharge or bleeding. Skin: Not pale. Not jaundice Lower extremities: no pretibial edema bilaterally  Neurologic:  alert & oriented X3.  Speech normal, gait appropriate for age and unassisted Psych--  Cognition and judgment appear intact.  Cooperative with normal attention span and concentration.  Behavior appropriate. No anxious or depressed appearing.     Assessment     54 year old female, PMH hypertrophic cardiomyopathy, torsade the points, sinus node dysfunction, ICD implant, presents with  Suprapubic pain: Urine dip negative, sending a UA urine culture Pelvic  exam benign. Had a colonoscopy 2019: Polyps. Etiology unclear, will get a pelvic ultrasound with TV US. Further advised with results  This visit occurred during the SARS-CoV-2 public health emergency.  Safety protocols were in place, including screening questions prior to the visit, additional usage of staff PPE, and extensive cleaning of exam room while observing appropriate contact time as indicated for disinfecting solutions.

## 2020-02-26 NOTE — Patient Instructions (Signed)
   STOP BY THE FIRST FLOOR: Schedule the ultrasound

## 2020-02-27 LAB — URINALYSIS, ROUTINE W REFLEX MICROSCOPIC
Bilirubin Urine: NEGATIVE
Hgb urine dipstick: NEGATIVE
Ketones, ur: NEGATIVE
Leukocytes,Ua: NEGATIVE
Nitrite: NEGATIVE
RBC / HPF: NONE SEEN (ref 0–?)
Specific Gravity, Urine: <=1.005 — AB (ref 1.000–1.030)
Total Protein, Urine: NEGATIVE
Urine Glucose: NEGATIVE
Urobilinogen, UA: 0.2 (ref 0.0–1.0)
pH: 7 (ref 5.0–8.0)

## 2020-02-28 ENCOUNTER — Ambulatory Visit (HOSPITAL_BASED_OUTPATIENT_CLINIC_OR_DEPARTMENT_OTHER)
Admission: RE | Admit: 2020-02-28 | Discharge: 2020-02-28 | Disposition: A | Discharge status: Home / Self Care | Payer: No Typology Code available for payment source | Source: Ambulatory Visit | Attending: Internal Medicine | Admitting: Internal Medicine

## 2020-02-28 ENCOUNTER — Encounter: Payer: Self-pay | Admitting: Internal Medicine

## 2020-02-28 ENCOUNTER — Other Ambulatory Visit: Payer: Self-pay

## 2020-02-28 DIAGNOSIS — R102 Pelvic and perineal pain: Secondary | ICD-10-CM | POA: Insufficient documentation

## 2020-02-28 LAB — URINE CULTURE
MICRO NUMBER:: 10776052
SPECIMEN QUALITY:: ADEQUATE

## 2020-03-14 ENCOUNTER — Ambulatory Visit (INDEPENDENT_AMBULATORY_CARE_PROVIDER_SITE_OTHER): Payer: No Typology Code available for payment source | Admitting: *Deleted

## 2020-03-14 DIAGNOSIS — I472 Ventricular tachycardia, unspecified: Secondary | ICD-10-CM

## 2020-03-14 LAB — CUP PACEART REMOTE DEVICE CHECK
Battery Remaining Longevity: 38 mo
Battery Voltage: 2.96 V
Brady Statistic AP VP Percent: 0.03 %
Brady Statistic AP VS Percent: 63.4 %
Brady Statistic AS VP Percent: 0.01 %
Brady Statistic AS VS Percent: 36.56 %
Brady Statistic RA Percent Paced: 63.3 %
Brady Statistic RV Percent Paced: 0.04 %
Date Time Interrogation Session: 20210819033324
HighPow Impedance: 67 Ohm
Implantable Lead Implant Date: 20160804
Implantable Lead Implant Date: 20160804
Implantable Lead Location: 753859
Implantable Lead Location: 753860
Implantable Lead Model: 5076
Implantable Pulse Generator Implant Date: 20160804
Lead Channel Impedance Value: 456 Ohm
Lead Channel Impedance Value: 456 Ohm
Lead Channel Impedance Value: 532 Ohm
Lead Channel Pacing Threshold Amplitude: 0.625 V
Lead Channel Pacing Threshold Amplitude: 0.625 V
Lead Channel Pacing Threshold Pulse Width: 0.4 ms
Lead Channel Pacing Threshold Pulse Width: 0.4 ms
Lead Channel Sensing Intrinsic Amplitude: 1 mV
Lead Channel Sensing Intrinsic Amplitude: 1 mV
Lead Channel Sensing Intrinsic Amplitude: 7.25 mV
Lead Channel Sensing Intrinsic Amplitude: 7.25 mV
Lead Channel Setting Pacing Amplitude: 1.5 V
Lead Channel Setting Pacing Amplitude: 2 V
Lead Channel Setting Pacing Pulse Width: 0.4 ms
Lead Channel Setting Sensing Sensitivity: 0.3 mV

## 2020-03-15 NOTE — Progress Notes (Signed)
Remote ICD transmission.   

## 2020-04-09 ENCOUNTER — Ambulatory Visit (INDEPENDENT_AMBULATORY_CARE_PROVIDER_SITE_OTHER): Payer: No Typology Code available for payment source | Admitting: Family Medicine

## 2020-04-09 ENCOUNTER — Encounter: Payer: Self-pay | Admitting: Family Medicine

## 2020-04-09 ENCOUNTER — Other Ambulatory Visit: Payer: Self-pay

## 2020-04-09 VITALS — BP 104/78 | HR 66 | Temp 97.7°F | Resp 18 | Ht 63.0 in | Wt 151.0 lb

## 2020-04-09 DIAGNOSIS — Z95810 Presence of automatic (implantable) cardiac defibrillator: Secondary | ICD-10-CM | POA: Diagnosis not present

## 2020-04-09 DIAGNOSIS — I422 Other hypertrophic cardiomyopathy: Secondary | ICD-10-CM

## 2020-04-09 DIAGNOSIS — Z0000 Encounter for general adult medical examination without abnormal findings: Secondary | ICD-10-CM | POA: Diagnosis not present

## 2020-04-09 DIAGNOSIS — Z8616 Personal history of COVID-19: Secondary | ICD-10-CM

## 2020-04-09 NOTE — Assessment & Plan Note (Signed)
Per cardiology 

## 2020-04-09 NOTE — Progress Notes (Signed)
Subjective:     Teresa Henry is a 54 y.o. female and is here for a comprehensive physical exam. The patient reports no problems.  Social History   Socioeconomic History  . Marital status: Married    Spouse name: Not on file  . Number of children: Not on file  . Years of education: Not on file  . Highest education level: Not on file  Occupational History  . Occupation: Agricultural engineer-- self employed  Tobacco Use  . Smoking status: Never Smoker  . Smokeless tobacco: Never Used  Vaping Use  . Vaping Use: Never used  Substance and Sexual Activity  . Alcohol use: No  . Drug use: No  . Sexual activity: Yes    Partners: Male  Other Topics Concern  . Not on file  Social History Narrative  . Not on file   Social Determinants of Health   Financial Resource Strain:   . Difficulty of Paying Living Expenses: Not on file  Food Insecurity:   . Worried About Charity fundraiser in the Last Year: Not on file  . Ran Out of Food in the Last Year: Not on file  Transportation Needs:   . Lack of Transportation (Medical): Not on file  . Lack of Transportation (Non-Medical): Not on file  Physical Activity:   . Days of Exercise per Week: Not on file  . Minutes of Exercise per Session: Not on file  Stress:   . Feeling of Stress : Not on file  Social Connections:   . Frequency of Communication with Friends and Family: Not on file  . Frequency of Social Gatherings with Friends and Family: Not on file  . Attends Religious Services: Not on file  . Active Member of Clubs or Organizations: Not on file  . Attends Archivist Meetings: Not on file  . Marital Status: Not on file  Intimate Partner Violence:   . Fear of Current or Ex-Partner: Not on file  . Emotionally Abused: Not on file  . Physically Abused: Not on file  . Sexually Abused: Not on file   Health Maintenance  Topic Date Due  . COVID-19 Vaccine (1) Never done  . MAMMOGRAM  04/07/2019  . PAP SMEAR-Modifier   04/07/2019  . INFLUENZA VACCINE  05/28/2020 (Originally 02/25/2020)  . COLONOSCOPY  04/20/2021  . TETANUS/TDAP  04/19/2022  . Hepatitis C Screening  Completed  . HIV Screening  Completed    The following portions of the patient's history were reviewed and updated as appropriate:  She  has a past medical history of Cardiac arrest ([place]), Frequent episodic tension-type headache, and Prolonged Q-T interval on ECG. She does not have any pertinent problems on file. She  has a past surgical history that includes Breast biopsy (1999); Cardiac catheterization (N/A, 02/28/2015); and Cardiac catheterization (N/A, 02/28/2015). Her family history includes Arthritis in her father and mother; Colon polyps in her father and mother; Heart attack (age of onset: 58) in her brother; Hypertension in her father and mother; Stroke in her maternal grandmother and paternal grandmother. She  reports that she has never smoked. She has never used smokeless tobacco. She reports that she does not drink alcohol and does not use drugs. She has a current medication list which includes the following prescription(s): aspirin, b complex vitamins, krill oil, nadolol, and vitamin c. Current Outpatient Medications on File Prior to Visit  Medication Sig Dispense Refill  . aspirin 81 MG tablet Take 81 mg by mouth daily.    Marland Kitchen  b complex vitamins tablet Take 1 tablet by mouth daily.    Javier Docker Oil 500 MG CAPS Take by mouth.    . nadolol (CORGARD) 40 MG tablet TAKE ONE & ONE-HALF TABLETS (60 MG) TWICE DAILY PLEASE KEEP APPT IN NOVEMBER FOR FUTURE REFILLS 270 tablet 3  . vitamin C (ASCORBIC ACID) 500 MG tablet Take 500 mg by mouth daily.     No current facility-administered medications on file prior to visit.   She has No Known Allergies..  Review of Systems Review of Systems  Constitutional: Negative for activity change, appetite change and fatigue.  HENT: Negative for hearing loss, congestion, tinnitus and ear discharge.  dentist  q32m Eyes: Negative for visual disturbance (see optho q1y -- vision corrected to 20/20 with glasses).  Respiratory: Negative for cough, chest tightness and shortness of breath.   Cardiovascular: Negative for chest pain, palpitations and leg swelling.  Gastrointestinal: Negative for abdominal pain, diarrhea, constipation and abdominal distention.  Genitourinary: Negative for urgency, frequency, decreased urine volume and difficulty urinating.  Musculoskeletal: Negative for back pain, arthralgias and gait problem.  Skin: Negative for color change, pallor and rash.  Neurological: Negative for dizziness, light-headedness, numbness and headaches.  Hematological: Negative for adenopathy. Does not bruise/bleed easily.  Psychiatric/Behavioral: Negative for suicidal ideas, confusion, sleep disturbance, self-injury, dysphoric mood, decreased concentration and agitation.       Objective:    BP 104/78 (BP Location: Right Arm, Patient Position: Sitting, Cuff Size: Normal)   Pulse 66   Temp 97.7 F (36.5 C) (Oral)   Resp 18   Ht 5\' 3"  (1.6 m)   Wt 151 lb (68.5 kg)   SpO2 98%   BMI 26.75 kg/m  General appearance: alert, cooperative, appears stated age and no distress Head: Normocephalic, without obvious abnormality, atraumatic Eyes: negative findings: lids and lashes normal and conjunctivae and sclerae normal Ears: normal TM's and external ear canals both ears Neck: no adenopathy, no carotid bruit, no JVD, supple, symmetrical, trachea midline and thyroid not enlarged, symmetric, no tenderness/mass/nodules Back: symmetric, no curvature. ROM normal. No CVA tenderness. Lungs: clear to auscultation bilaterally Heart: regular rate and rhythm, S1, S2 normal, no murmur, click, rub or gallop Abdomen: soft, non-tender; bowel sounds normal; no masses,  no organomegaly Extremities: extremities normal, atraumatic, no cyanosis or edema Pulses: 2+ and symmetric Skin: Skin color, texture, turgor normal. No  rashes or lesions Lymph nodes: Cervical, supraclavicular, and axillary nodes normal. Neurologic: Alert and oriented X 3, normal strength and tone. Normal symmetric reflexes. Normal coordination and gait    Assessment:    Healthy female exam.      Plan:    ghm utd Check labs  See After Visit Summary for Counseling Recommendations     1. History of COVID-19 Check lab Pt prefers not to get covid vaccine at this time  - SAR CoV2 Serology (COVID 19)AB(IGG)IA  2. Preventative health care See ao - Lipid panel - CBC with Differential/Platelet - TSH - Comprehensive metabolic panel  3. ICD (implantable cardioverter-defibrillator) in place Per cardiology  4. Other hypertrophic cardiomyopathy [facility]) Per cardiology

## 2020-04-09 NOTE — Patient Instructions (Signed)

## 2020-04-10 LAB — COMPREHENSIVE METABOLIC PANEL
AG Ratio: 1.8 (calc) (ref 1.0–2.5)
ALT: 29 U/L (ref 6–29)
AST: 27 U/L (ref 10–35)
Albumin: 4.5 g/dL (ref 3.6–5.1)
Alkaline phosphatase (APISO): 103 U/L (ref 37–153)
BUN: 11 mg/dL (ref 7–25)
CO2: 28 mmol/L (ref 20–32)
Calcium: 10 mg/dL (ref 8.6–10.4)
Chloride: 103 mmol/L (ref 98–110)
Creat: 0.77 mg/dL (ref 0.50–1.05)
Globulin: 2.5 g/dL (calc) (ref 1.9–3.7)
Glucose, Bld: 98 mg/dL (ref 65–99)
Potassium: 4.9 mmol/L (ref 3.5–5.3)
Sodium: 137 mmol/L (ref 135–146)
Total Bilirubin: 0.4 mg/dL (ref 0.2–1.2)
Total Protein: 7 g/dL (ref 6.1–8.1)

## 2020-04-10 LAB — LIPID PANEL
Cholesterol: 185 mg/dL (ref <200)
HDL: 40 mg/dL — ABNORMAL LOW (ref >=50)
LDL Cholesterol (Calc): 122 mg/dL (calc) — ABNORMAL HIGH
Non-HDL Cholesterol (Calc): 145 mg/dL (calc) — ABNORMAL HIGH (ref <130)
Total CHOL/HDL Ratio: 4.6 (calc) (ref <5.0)
Triglycerides: 115 mg/dL (ref <150)

## 2020-04-10 LAB — CBC WITH DIFFERENTIAL/PLATELET
Absolute Monocytes: 438 cells/uL (ref 200–950)
Basophils Absolute: 90 cells/uL (ref 0–200)
Basophils Relative: 1.5 %
Eosinophils Absolute: 258 cells/uL (ref 15–500)
Eosinophils Relative: 4.3 %
HCT: 44.8 % (ref 35.0–45.0)
Hemoglobin: 14.9 g/dL (ref 11.7–15.5)
Lymphs Abs: 2250 cells/uL (ref 850–3900)
MCH: 28.6 pg (ref 27.0–33.0)
MCHC: 33.3 g/dL (ref 32.0–36.0)
MCV: 86 fL (ref 80.0–100.0)
MPV: 10.9 fL (ref 7.5–12.5)
Monocytes Relative: 7.3 %
Neutro Abs: 2964 cells/uL (ref 1500–7800)
Neutrophils Relative %: 49.4 %
Platelets: 285 10*3/uL (ref 140–400)
RBC: 5.21 10*6/uL — ABNORMAL HIGH (ref 3.80–5.10)
RDW: 12.5 % (ref 11.0–15.0)
Total Lymphocyte: 37.5 %
WBC: 6 10*3/uL (ref 3.8–10.8)

## 2020-04-10 LAB — SARS-COV-2 ANTIBODY(IGG)SPIKE,SEMI-QUANTITATIVE: SARS COV1 AB(IGG)SPIKE,SEMI QN: >20 index — ABNORMAL HIGH (ref <1.00)

## 2020-04-10 LAB — TSH: TSH: 0.99 mIU/L

## 2020-06-13 ENCOUNTER — Ambulatory Visit (INDEPENDENT_AMBULATORY_CARE_PROVIDER_SITE_OTHER): Payer: No Typology Code available for payment source

## 2020-06-13 DIAGNOSIS — I472 Ventricular tachycardia, unspecified: Secondary | ICD-10-CM

## 2020-06-14 LAB — CUP PACEART REMOTE DEVICE CHECK
Battery Remaining Longevity: 35 mo
Battery Voltage: 2.95 V
Brady Statistic AP VP Percent: 0.03 %
Brady Statistic AP VS Percent: 63.18 %
Brady Statistic AS VP Percent: 0.01 %
Brady Statistic AS VS Percent: 36.78 %
Brady Statistic RA Percent Paced: 63.12 %
Brady Statistic RV Percent Paced: 0.05 %
Date Time Interrogation Session: 20211119042408
HighPow Impedance: 71 Ohm
Implantable Lead Implant Date: 20160804
Implantable Lead Implant Date: 20160804
Implantable Lead Location: 753859
Implantable Lead Location: 753860
Implantable Lead Model: 5076
Implantable Pulse Generator Implant Date: 20160804
Lead Channel Impedance Value: 418 Ohm
Lead Channel Impedance Value: 418 Ohm
Lead Channel Impedance Value: 532 Ohm
Lead Channel Pacing Threshold Amplitude: 0.625 V
Lead Channel Pacing Threshold Amplitude: 0.625 V
Lead Channel Pacing Threshold Pulse Width: 0.4 ms
Lead Channel Pacing Threshold Pulse Width: 0.4 ms
Lead Channel Sensing Intrinsic Amplitude: 1.375 mV
Lead Channel Sensing Intrinsic Amplitude: 1.375 mV
Lead Channel Sensing Intrinsic Amplitude: 8.25 mV
Lead Channel Sensing Intrinsic Amplitude: 8.25 mV
Lead Channel Setting Pacing Amplitude: 1.5 V
Lead Channel Setting Pacing Amplitude: 2 V
Lead Channel Setting Pacing Pulse Width: 0.4 ms
Lead Channel Setting Sensing Sensitivity: 0.3 mV

## 2020-06-17 NOTE — Progress Notes (Signed)
Remote ICD transmission.   

## 2020-06-28 ENCOUNTER — Other Ambulatory Visit: Payer: Self-pay | Admitting: Internal Medicine

## 2020-08-06 ENCOUNTER — Other Ambulatory Visit: Payer: Self-pay

## 2020-08-06 ENCOUNTER — Encounter: Payer: Self-pay | Admitting: Internal Medicine

## 2020-08-06 ENCOUNTER — Ambulatory Visit: Payer: No Typology Code available for payment source | Admitting: Internal Medicine

## 2020-08-06 VITALS — BP 114/72 | HR 65 | Ht 63.0 in | Wt 154.0 lb

## 2020-08-06 DIAGNOSIS — I495 Sick sinus syndrome: Secondary | ICD-10-CM | POA: Diagnosis not present

## 2020-08-06 DIAGNOSIS — I469 Cardiac arrest, cause unspecified: Secondary | ICD-10-CM

## 2020-08-06 DIAGNOSIS — Z95810 Presence of automatic (implantable) cardiac defibrillator: Secondary | ICD-10-CM

## 2020-08-06 NOTE — Patient Instructions (Signed)
Medication Instructions:  Your physician recommends that you continue on your current medications as directed. Please refer to the Current Medication list given to you today.  Labwork: None ordered.  Testing/Procedures: None ordered.  Follow-Up: Your physician wants you to follow-up in: one year with Cristopher Peru, MD or one of the following Advanced Practice Providers on your designated Care Team:    Chanetta Marshall, NP  Tommye Standard, PA-C  Legrand Como "Jonni Sanger" [place], [place]  Remote monitoring is used to monitor your ICD from home. This monitoring reduces the number of office visits required to check your device to one time per year. It allows [place] to keep an eye on the functioning of your device to ensure it is working properly. You are scheduled for a device check from home on 09/12/2020. You may send your transmission at any time that day. If you have a wireless device, the transmission will be sent automatically. After your physician reviews your transmission, you will receive a postcard with your next transmission date.  Any Other Special Instructions Will Be Listed Below (If Applicable).  If you need a refill on your cardiac medications before your next appointment, please call your pharmacy.

## 2020-08-06 NOTE — Progress Notes (Signed)
HPI Teresa Henry returns today for followup. She is a74yo woman with a h/o syncope who was found to have VF and successfully defibrillated while in the hospital over [place]. Her QT interval was increased and she was given a presumptive diagnosis of long QT. She was noted to have an LV thickness of nearly 16 mm. She also had some sinus bradycardia. She underwent insertion of a DDD ICD and has undergone genetic testing which failed to reveal a QT prolonging gene and most recently no evidence of HCM. In the interim she has done well. 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 She has tried to lose weight by walking but was frustrated that she lost only 4 lbs despite walking 5-6 times a week.   No Known Allergies   Current Outpatient Medications  Medication Sig Dispense Refill  . aspirin 81 MG tablet Take 81 mg by mouth daily.    Marland Kitchen b complex vitamins tablet Take 1 tablet by mouth daily.    . Cholecalciferol (D-3-5) 125 MCG (5000 UT) capsule Take 5,000 Units by mouth daily.    Javier Docker Oil 500 MG CAPS Take by mouth.    . nadolol (CORGARD) 40 MG tablet TAKE ONE & ONE-HALF TABLETS (60 MG) TWICE DAILY 270 tablet 0  . vitamin C (ASCORBIC ACID) 500 MG tablet Take 500 mg by mouth daily.    . Zinc 50 MG TABS Take 1 tablet by mouth daily.     No current facility-administered medications for this visit.     Past Medical History:  Diagnosis Date  . Cardiac arrest [facility])    a. s/p MDT dual chamber ICD  . Frequent episodic tension-type headache   . Prolonged Q-T interval on ECG     ROS:   All systems reviewed and negative except as noted in the HPI.   Past Surgical History:  Procedure Laterality Date  . BREAST BIOPSY  1999   Needle Biopsy- Non cancer  . CARDIAC CATHETERIZATION N/A 02/28/2015   Procedure: Left Heart Cath and Coronary Angiography;  Surgeon: Belva Crome, MD;  Location: [facility];  Service: Cardiovascular;  Laterality: N/A;  . EP  IMPLANTABLE DEVICE N/A 02/28/2015   MDT dual chamber ICD implanted by Dr Rayann Heman for secondary prevention     Family History  Problem Relation Age of Onset  . Arthritis Mother   . Hypertension Mother   . Colon polyps Mother   . Arthritis Father   . Hypertension Father   . Colon polyps Father   . Heart attack Brother 53  . Stroke Maternal Grandmother   . Stroke Paternal Grandmother   . Colon cancer Neg Hx   . Esophageal cancer Neg Hx   . Rectal cancer Neg Hx   . Stomach cancer Neg Hx      Social History   Socioeconomic History  . Marital status: Married    Spouse name: Not on file  . Number of children: Not on file  . Years of education: Not on file  . Highest education level: Not on file  Occupational History  . Occupation: Agricultural engineer-- self employed  Tobacco Use  . Smoking status: Never Smoker  . Smokeless tobacco: Never Used  Vaping Use  . Vaping Use: Never used  Substance and Sexual Activity  . Alcohol use: No  . Drug use: No  . Sexual activity: Yes    Partners: Male  Other Topics Concern  .  Not on file  Social History Narrative  . Not on file   Social Determinants of Health   Financial Resource Strain: Not on file  Food Insecurity: Not on file  Transportation Needs: Not on file  Physical Activity: Not on file  Stress: Not on file  Social Connections: Not on file  Intimate Partner Violence: Not on file     BP 114/72   Pulse 65   Ht 5\' 3"  (1.6 m)   Wt 154 lb (69.9 kg)   SpO2 96%   BMI 27.28 kg/m   Physical Exam:  Well appearing NAD HEENT: Unremarkable Neck:  No JVD, no thyromegally Lymphatics:  No adenopathy Back:  No CVA tenderness Lungs:  Clear HEART:  Regular rate rhythm, no murmurs, no rubs, no clicks Abd:  soft, positive bowel sounds, no organomegally, no rebound, no guarding Ext:  2 plus pulses, no edema, no cyanosis, no clubbing Skin:  No rashes no nodules Neuro:  CN II through XII intact, motor grossly intact  EKG -  NSR  DEVICE  Normal device function.  See PaceArt for details.   Assess/Plan: 1. 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 She has not had any additional arrhythmia 2. ICD - her medtronic DDD ICD is working normally.  3. HCM - she is asymptomatic. She will continue her current meds.  4. Sinus node dysfunction - she is pacing 66% of the time.   Mikle Bosworth.D.

## 2020-09-12 ENCOUNTER — Ambulatory Visit (INDEPENDENT_AMBULATORY_CARE_PROVIDER_SITE_OTHER): Payer: No Typology Code available for payment source

## 2020-09-12 DIAGNOSIS — I472 Ventricular tachycardia, unspecified: Secondary | ICD-10-CM

## 2020-09-12 LAB — CUP PACEART REMOTE DEVICE CHECK
Battery Remaining Longevity: 33 mo
Battery Voltage: 2.95 V
Brady Statistic AP VP Percent: 0.06 %
Brady Statistic AP VS Percent: 55.29 %
Brady Statistic AS VP Percent: 0.02 %
Brady Statistic AS VS Percent: 44.62 %
Brady Statistic RA Percent Paced: 55.24 %
Brady Statistic RV Percent Paced: 0.08 %
Date Time Interrogation Session: 20220217043824
HighPow Impedance: 70 Ohm
Implantable Lead Implant Date: 20160804
Implantable Lead Implant Date: 20160804
Implantable Lead Location: 753859
Implantable Lead Location: 753860
Implantable Lead Model: 5076
Implantable Pulse Generator Implant Date: 20160804
Lead Channel Impedance Value: 418 Ohm
Lead Channel Impedance Value: 418 Ohm
Lead Channel Impedance Value: 513 Ohm
Lead Channel Pacing Threshold Amplitude: 0.625 V
Lead Channel Pacing Threshold Amplitude: 0.625 V
Lead Channel Pacing Threshold Pulse Width: 0.4 ms
Lead Channel Pacing Threshold Pulse Width: 0.4 ms
Lead Channel Sensing Intrinsic Amplitude: 1.25 mV
Lead Channel Sensing Intrinsic Amplitude: 1.25 mV
Lead Channel Sensing Intrinsic Amplitude: 7.5 mV
Lead Channel Sensing Intrinsic Amplitude: 7.5 mV
Lead Channel Setting Pacing Amplitude: 1.5 V
Lead Channel Setting Pacing Amplitude: 2 V
Lead Channel Setting Pacing Pulse Width: 0.4 ms
Lead Channel Setting Sensing Sensitivity: 0.3 mV

## 2020-09-18 NOTE — Progress Notes (Signed)
Remote ICD transmission.   

## 2020-09-21 ENCOUNTER — Other Ambulatory Visit: Payer: Self-pay | Admitting: Internal Medicine

## 2020-10-24 ENCOUNTER — Other Ambulatory Visit: Payer: Self-pay | Admitting: Obstetrics and Gynecology

## 2020-10-24 DIAGNOSIS — R928 Other abnormal and inconclusive findings on diagnostic imaging of breast: Secondary | ICD-10-CM

## 2020-11-13 ENCOUNTER — Ambulatory Visit
Admission: RE | Admit: 2020-11-13 | Discharge: 2020-11-13 | Disposition: A | Discharge status: Home / Self Care | Payer: No Typology Code available for payment source | Source: Ambulatory Visit | Attending: Obstetrics and Gynecology | Admitting: Obstetrics and Gynecology

## 2020-11-13 ENCOUNTER — Other Ambulatory Visit: Payer: Self-pay

## 2020-11-13 DIAGNOSIS — R928 Other abnormal and inconclusive findings on diagnostic imaging of breast: Secondary | ICD-10-CM

## 2020-12-12 ENCOUNTER — Ambulatory Visit (INDEPENDENT_AMBULATORY_CARE_PROVIDER_SITE_OTHER): Payer: No Typology Code available for payment source

## 2020-12-12 DIAGNOSIS — I469 Cardiac arrest, cause unspecified: Secondary | ICD-10-CM | POA: Diagnosis not present

## 2020-12-16 LAB — CUP PACEART REMOTE DEVICE CHECK
Battery Remaining Longevity: 30 mo
Battery Voltage: 2.95 V
Brady Statistic AP VP Percent: 0.03 %
Brady Statistic AP VS Percent: 66.74 %
Brady Statistic AS VP Percent: 0.02 %
Brady Statistic AS VS Percent: 33.21 %
Brady Statistic RA Percent Paced: 66.67 %
Brady Statistic RV Percent Paced: 0.05 %
Date Time Interrogation Session: 20220520003327
HighPow Impedance: 70 Ohm
Implantable Lead Implant Date: 20160804
Implantable Lead Implant Date: 20160804
Implantable Lead Location: 753859
Implantable Lead Location: 753860
Implantable Lead Model: 5076
Implantable Pulse Generator Implant Date: 20160804
Lead Channel Impedance Value: 418 Ohm
Lead Channel Impedance Value: 475 Ohm
Lead Channel Impedance Value: 608 Ohm
Lead Channel Pacing Threshold Amplitude: 0.5 V
Lead Channel Pacing Threshold Amplitude: 0.625 V
Lead Channel Pacing Threshold Pulse Width: 0.4 ms
Lead Channel Pacing Threshold Pulse Width: 0.4 ms
Lead Channel Sensing Intrinsic Amplitude: 1.375 mV
Lead Channel Sensing Intrinsic Amplitude: 1.375 mV
Lead Channel Sensing Intrinsic Amplitude: 7.5 mV
Lead Channel Sensing Intrinsic Amplitude: 7.5 mV
Lead Channel Setting Pacing Amplitude: 1.5 V
Lead Channel Setting Pacing Amplitude: 2 V
Lead Channel Setting Pacing Pulse Width: 0.4 ms
Lead Channel Setting Sensing Sensitivity: 0.3 mV

## 2021-01-03 NOTE — Progress Notes (Signed)
Remote ICD transmission.   

## 2021-03-13 ENCOUNTER — Ambulatory Visit (INDEPENDENT_AMBULATORY_CARE_PROVIDER_SITE_OTHER): Payer: No Typology Code available for payment source

## 2021-03-13 DIAGNOSIS — I469 Cardiac arrest, cause unspecified: Secondary | ICD-10-CM | POA: Diagnosis not present

## 2021-03-13 LAB — CUP PACEART REMOTE DEVICE CHECK
Battery Remaining Longevity: 33 mo
Battery Voltage: 2.94 V
Brady Statistic AP VP Percent: 0.03 %
Brady Statistic AP VS Percent: 58.45 %
Brady Statistic AS VP Percent: 0.01 %
Brady Statistic AS VS Percent: 41.51 %
Brady Statistic RA Percent Paced: 58.4 %
Brady Statistic RV Percent Paced: 0.04 %
Date Time Interrogation Session: 20220818022705
HighPow Impedance: 70 Ohm
Implantable Lead Implant Date: 20160804
Implantable Lead Implant Date: 20160804
Implantable Lead Location: 753859
Implantable Lead Location: 753860
Implantable Lead Model: 5076
Implantable Pulse Generator Implant Date: 20160804
Lead Channel Impedance Value: 456 Ohm
Lead Channel Impedance Value: 475 Ohm
Lead Channel Impedance Value: 608 Ohm
Lead Channel Pacing Threshold Amplitude: 0.625 V
Lead Channel Pacing Threshold Amplitude: 0.75 V
Lead Channel Pacing Threshold Pulse Width: 0.4 ms
Lead Channel Pacing Threshold Pulse Width: 0.4 ms
Lead Channel Sensing Intrinsic Amplitude: 1.125 mV
Lead Channel Sensing Intrinsic Amplitude: 1.125 mV
Lead Channel Sensing Intrinsic Amplitude: 8 mV
Lead Channel Sensing Intrinsic Amplitude: 8 mV
Lead Channel Setting Pacing Amplitude: 1.5 V
Lead Channel Setting Pacing Amplitude: 2 V
Lead Channel Setting Pacing Pulse Width: 0.4 ms
Lead Channel Setting Sensing Sensitivity: 0.3 mV

## 2021-04-01 NOTE — Progress Notes (Signed)
Remote ICD transmission.   

## 2021-04-10 ENCOUNTER — Other Ambulatory Visit: Payer: Self-pay

## 2021-04-11 ENCOUNTER — Encounter: Payer: Self-pay | Admitting: Family Medicine

## 2021-04-11 ENCOUNTER — Ambulatory Visit (INDEPENDENT_AMBULATORY_CARE_PROVIDER_SITE_OTHER): Payer: No Typology Code available for payment source | Admitting: Family Medicine

## 2021-04-11 VITALS — BP 110/80 | HR 64 | Temp 98.4°F | Resp 18 | Ht 63.0 in | Wt 148.2 lb

## 2021-04-11 DIAGNOSIS — R232 Flushing: Secondary | ICD-10-CM | POA: Diagnosis not present

## 2021-04-11 DIAGNOSIS — Z0000 Encounter for general adult medical examination without abnormal findings: Secondary | ICD-10-CM | POA: Diagnosis not present

## 2021-04-11 DIAGNOSIS — I422 Other hypertrophic cardiomyopathy: Secondary | ICD-10-CM | POA: Diagnosis not present

## 2021-04-11 DIAGNOSIS — M2550 Pain in unspecified joint: Secondary | ICD-10-CM | POA: Insufficient documentation

## 2021-04-11 DIAGNOSIS — Z95810 Presence of automatic (implantable) cardiac defibrillator: Secondary | ICD-10-CM | POA: Diagnosis not present

## 2021-04-11 LAB — LIPID PANEL
Cholesterol: 202 mg/dL — ABNORMAL HIGH (ref 0–200)
HDL: 44.6 mg/dL (ref >39.00)
LDL Cholesterol: 134 mg/dL — ABNORMAL HIGH (ref 0–99)
NonHDL: 157.25
Total CHOL/HDL Ratio: 5
Triglycerides: 118 mg/dL (ref 0.0–149.0)
VLDL: 23.6 mg/dL (ref 0.0–40.0)

## 2021-04-11 LAB — CBC WITH DIFFERENTIAL/PLATELET
Basophils Absolute: 0.1 10*3/uL (ref 0.0–0.1)
Basophils Relative: 0.6 % (ref 0.0–3.0)
Eosinophils Absolute: 0.3 10*3/uL (ref 0.0–0.7)
Eosinophils Relative: 3.2 % (ref 0.0–5.0)
HCT: 42.1 % (ref 36.0–46.0)
Hemoglobin: 14.2 g/dL (ref 12.0–15.0)
Lymphocytes Relative: 31.4 % (ref 12.0–46.0)
Lymphs Abs: 2.5 10*3/uL (ref 0.7–4.0)
MCHC: 33.6 g/dL (ref 30.0–36.0)
MCV: 84.5 fl (ref 78.0–100.0)
Monocytes Absolute: 0.5 10*3/uL (ref 0.1–1.0)
Monocytes Relative: 6.4 % (ref 3.0–12.0)
Neutro Abs: 4.7 10*3/uL (ref 1.4–7.7)
Neutrophils Relative %: 58.4 % (ref 43.0–77.0)
Platelets: 231 10*3/uL (ref 150.0–400.0)
RBC: 4.99 Mil/uL (ref 3.87–5.11)
RDW: 13.2 % (ref 11.5–15.5)
WBC: 8 10*3/uL (ref 4.0–10.5)

## 2021-04-11 LAB — COMPREHENSIVE METABOLIC PANEL
ALT: 40 U/L — ABNORMAL HIGH (ref 0–35)
AST: 31 U/L (ref 0–37)
Albumin: 4.3 g/dL (ref 3.5–5.2)
Alkaline Phosphatase: 122 U/L — ABNORMAL HIGH (ref 39–117)
BUN: 14 mg/dL (ref 6–23)
CO2: 29 mEq/L (ref 19–32)
Calcium: 10 mg/dL (ref 8.4–10.5)
Chloride: 100 mEq/L (ref 96–112)
Creatinine, Ser: 0.79 mg/dL (ref 0.40–1.20)
GFR: 84.44 mL/min (ref >60.00)
Glucose, Bld: 88 mg/dL (ref 70–99)
Potassium: 4.3 mEq/L (ref 3.5–5.1)
Sodium: 138 mEq/L (ref 135–145)
Total Bilirubin: 0.5 mg/dL (ref 0.2–1.2)
Total Protein: 6.9 g/dL (ref 6.0–8.3)

## 2021-04-11 LAB — VITAMIN D 25 HYDROXY (VIT D DEFICIENCY, FRACTURES): VITD: 53.85 ng/mL (ref 30.00–100.00)

## 2021-04-11 NOTE — Assessment & Plan Note (Signed)
Per cardiology 

## 2021-04-11 NOTE — Assessment & Plan Note (Signed)
ghm utd Check labs  See avs  

## 2021-04-11 NOTE — Progress Notes (Signed)
Subjective:   By signing my name below, I, Zite Okoli, attest that this documentation has been prepared under the direction and in the presence of Ann Held, DO. 04/11/2021     Patient ID: Teresa Henry, female    DOB: Sep 29, 1965, 55 y.o.   MRN: [id number]  Chief Complaint  Patient presents with   Annual Exam    Pt states fsating     HPI Patient is in today for a comprehensive physical exam.  Her last menstrual cycle was in April 2021 so she is in menopause now. She has been experiencing hot flashes throughout the day but more in the morning. She was using black cohosh supplements and reports it helped but had to stop because she was concerned about its effects on her liver. She also reports joint pain in her hands, wrist and hips.  She denies fever, hearing loss, ear pain,congestion, sinus pain, sore throat, eye pain, chest pain, palpitations, cough, shortness of breath, wheezing, nausea. vomiting, diarrhea, constipation, blood in stool, dysuria,frequency, hematuria and headaches.   She is UTD on dental and vision care.  She is not interested in the shingles vaccine or the flu vaccine.  She has been regularly exercising and managing a healthy diet. She walks about 4 miles a day since May and is worried because she has not lost any weight. She has tried intermittent fasting but has seen no changes.  Her mother had a stroke in September 2021 otherwise there has been no changes in family history. There has been no recent surgeries.  Past Medical History:  Diagnosis Date   Cardiac arrest [facility])    a. s/p MDT dual chamber ICD   Frequent episodic tension-type headache    Prolonged Q-T interval on ECG     Past Surgical History:  Procedure Laterality Date   BREAST BIOPSY  1999   Needle Biopsy- Non cancer   CARDIAC CATHETERIZATION N/A 02/28/2015   Procedure: Left Heart Cath and Coronary Angiography;  Surgeon: Belva Crome, MD;  Location: [facility];  Service:  Cardiovascular;  Laterality: N/A;   EP IMPLANTABLE DEVICE N/A 02/28/2015   MDT dual chamber ICD implanted by Dr Rayann Heman for secondary prevention    Family History  Problem Relation Age of Onset   Arthritis Mother    Hypertension Mother    Colon polyps Mother    Stroke Mother    Arthritis Father    Hypertension Father    Colon polyps Father    Heart attack Brother 49   Stroke Maternal Grandmother    Stroke Paternal Grandmother    Colon cancer Neg Hx    Esophageal cancer Neg Hx    Rectal cancer Neg Hx    Stomach cancer Neg Hx     Social History   Socioeconomic History   Marital status: Married    Spouse name: Not on file   Number of children: Not on file   Years of education: Not on file   Highest education level: Not on file  Occupational History   Occupation: Agricultural engineer-- self employed  Tobacco Use   Smoking status: Never   Smokeless tobacco: Never  Vaping Use   Vaping Use: Never used  Substance and Sexual Activity   Alcohol use: No   Drug use: No   Sexual activity: Yes    Partners: Male  Other Topics Concern   Not on file  Social History Narrative   Not on file   Social Determinants  of Health   Financial Resource Strain: Not on file  Food Insecurity: Not on file  Transportation Needs: Not on file  Physical Activity: Not on file  Stress: Not on file  Social Connections: Not on file  Intimate Partner Violence: Not on file    Outpatient Medications Prior to Visit  Medication Sig Dispense Refill   aspirin 81 MG tablet Take 81 mg by mouth daily.     b complex vitamins tablet Take 1 tablet by mouth daily.     Cholecalciferol (D-3-5) 125 MCG (5000 UT) capsule Take 5,000 Units by mouth daily.     Krill Oil 500 MG CAPS Take by mouth.     nadolol (CORGARD) 40 MG tablet TAKE ONE & ONE-HALF TABLETS (60 MG) BY MOUTH TWICE DAILY 270 tablet 3   vitamin C (ASCORBIC ACID) 500 MG tablet Take 500 mg by mouth daily.     Zinc 50 MG TABS Take 1 tablet by mouth  daily.     No facility-administered medications prior to visit.    No Known Allergies  Review of Systems  Constitutional:  Negative for fever.  HENT:  Negative for congestion, ear pain, hearing loss, sinus pain and sore throat.   Eyes:  Negative for pain.  Respiratory:  Negative for cough, shortness of breath and wheezing.   Cardiovascular:  Negative for chest pain and palpitations.  Gastrointestinal:  Negative for blood in stool, constipation, diarrhea, nausea and vomiting.  Genitourinary:  Negative for dysuria, frequency and hematuria.  Musculoskeletal:  Positive for joint pain (hands, hips and left wrist).  Neurological:  Negative for headaches.      Objective:    Physical Exam Constitutional:      General: She is not in acute distress.    Appearance: Normal appearance. She is not ill-appearing.  HENT:     Head: Normocephalic and atraumatic.     Right Ear: Tympanic membrane, ear canal and external ear normal.     Left Ear: Tympanic membrane, ear canal and external ear normal.  Eyes:     Extraocular Movements: Extraocular movements intact.     Pupils: Pupils are equal, round, and reactive to light.  Cardiovascular:     Rate and Rhythm: Normal rate and regular rhythm.     Pulses: Normal pulses.     Heart sounds: Normal heart sounds. No murmur heard.   No gallop.  Pulmonary:     Effort: Pulmonary effort is normal. No respiratory distress.     Breath sounds: Normal breath sounds. No wheezing, rhonchi or rales.  Abdominal:     General: Bowel sounds are normal. There is no distension.     Palpations: Abdomen is soft. There is no mass.     Tenderness: There is no abdominal tenderness. There is no guarding or rebound.     Hernia: No hernia is present.  Musculoskeletal:     Cervical back: Normal range of motion and neck supple.  Lymphadenopathy:     Cervical: No cervical adenopathy.  Skin:    General: Skin is warm and dry.  Neurological:     Mental Status: She is alert  and oriented to person, place, and time.  Psychiatric:        Behavior: Behavior normal.    BP 110/80 (BP Location: Left Arm, Patient Position: Sitting, Cuff Size: Normal)   Pulse 64   Temp 98.4 F (36.9 C) (Oral)   Resp 18   Ht '5\' 3"'$  (1.6 m)   Wt 148 lb 3.2  oz (67.2 kg)   SpO2 97%   BMI 26.25 kg/m  Wt Readings from Last 3 Encounters:  04/11/21 148 lb 3.2 oz (67.2 kg)  08/06/20 154 lb (69.9 kg)  04/09/20 151 lb (68.5 kg)    Diabetic Foot Exam - Simple   No data filed    Lab Results  Component Value Date   WBC 6.0 04/09/2020   HGB 14.9 04/09/2020   HCT 44.8 04/09/2020   PLT 285 04/09/2020   GLUCOSE 98 04/09/2020   CHOL 185 04/09/2020   TRIG 115 04/09/2020   HDL 40 (L) 04/09/2020   LDLDIRECT 125.0 05/17/2019   LDLCALC 122 (H) 04/09/2020   ALT 29 04/09/2020   AST 27 04/09/2020   NA 137 04/09/2020   K 4.9 04/09/2020   CL 103 04/09/2020   CREATININE 0.77 04/09/2020   BUN 11 04/09/2020   CO2 28 04/09/2020   TSH 0.99 04/09/2020   INR 1.05 02/28/2015    Lab Results  Component Value Date   TSH 0.99 04/09/2020   Lab Results  Component Value Date   WBC 6.0 04/09/2020   HGB 14.9 04/09/2020   HCT 44.8 04/09/2020   MCV 86.0 04/09/2020   PLT 285 04/09/2020   Lab Results  Component Value Date   NA 137 04/09/2020   K 4.9 04/09/2020   CO2 28 04/09/2020   GLUCOSE 98 04/09/2020   BUN 11 04/09/2020   CREATININE 0.77 04/09/2020   BILITOT 0.4 04/09/2020   ALKPHOS 89 06/08/2019   AST 27 04/09/2020   ALT 29 04/09/2020   PROT 7.0 04/09/2020   ALBUMIN 4.4 06/08/2019   CALCIUM 10.0 04/09/2020   ANIONGAP 10 03/01/2015   GFR 76.07 06/08/2019   Lab Results  Component Value Date   CHOL 185 04/09/2020   Lab Results  Component Value Date   HDL 40 (L) 04/09/2020   Lab Results  Component Value Date   LDLCALC 122 (H) 04/09/2020   Lab Results  Component Value Date   TRIG 115 04/09/2020   Lab Results  Component Value Date   CHOLHDL 4.6 04/09/2020   No  results found for: HGBA1C      Mammogram: Last completed on 11/13/2020. Results showed calcifications in the left breast that is consistent with a degenerated fibroadenoma. No evidence of malignancy. Repeat in 1 year Colonoscopy: Last completed on 04/20/2018. Results showed polyps in the ascending and transverse colon that were removed with a cold snare, resected and retrieved. There was diverticulosis in the sigmoid and descending colon. Patient has a tortuous colon. Repeat in 10 years. Pap Smear: Last completed on 04/06/2018. Recently done at gyn  Assessment & Plan:   Problem List Items Addressed This Visit       Unprioritized   Other hypertrophic cardiomyopathy ([place])   Relevant Orders   Lipid panel   CBC with Differential/Platelet   Comprehensive metabolic panel   Hot flashes    Discussed options  She prefers to try natural options first       Relevant Orders   Lipid panel   CBC with Differential/Platelet   Thyroid Panel With TSH   Comprehensive metabolic panel   Rheumatoid Factor   Antinuclear Antib (ANA)   VITAMIN D 25 Hydroxy (Vit-D Deficiency, Fractures)   ICD (implantable cardioverter-defibrillator) in place    Per cardiology      Multiple joint pain    Check labs Ok to take tylenol arthritis prn  rto prn       Relevant Orders  Thyroid Panel With TSH   Comprehensive metabolic panel   Rheumatoid Factor   Antinuclear Antib (ANA)   VITAMIN D 25 Hydroxy (Vit-D Deficiency, Fractures)   Preventative health care - Primary    ghm utd Check labs  See avs       Relevant Orders   Lipid panel   CBC with Differential/Platelet   Thyroid Panel With TSH   Comprehensive metabolic panel   Rheumatoid Factor   Antinuclear Antib (ANA)   VITAMIN D 25 Hydroxy (Vit-D Deficiency, Fractures)    No orders of the defined types were placed in this encounter.   I,Zite Okoli,acting as a Education administrator for [employer], DO.,have documented all relevant documentation on the  behalf of Ann Held, DO,as directed by  Ann Held, DO while in the presence of Ann Held, [place], DO., personally preformed the services described in this documentation.  All medical record entries made by the scribe were at my direction and in my presence.  I have reviewed the chart and discharge instructions (if applicable) and agree that the record reflects my personal performance and is accurate and complete. 04/11/2021

## 2021-04-11 NOTE — Patient Instructions (Signed)
Preventive Care 40-55 Years Old, Female Preventive care refers to lifestyle choices and visits with your health care provider that can promote health and wellness. This includes: A yearly physical exam. This is also called an annual wellness visit. Regular dental and eye exams. Immunizations. Screening for certain conditions. Healthy lifestyle choices, such as: Eating a healthy diet. Getting regular exercise. Not using drugs or products that contain nicotine and tobacco. Limiting alcohol use. What can I expect for my preventive care visit? Physical exam Your health care provider will check your: Height and weight. These may be used to calculate your BMI (body mass index). BMI is a measurement that tells if you are at a healthy weight. Heart rate and blood pressure. Body temperature. Skin for abnormal spots. Counseling Your health care provider may ask you questions about your: Past medical problems. Family's medical history. Alcohol, tobacco, and drug use. Emotional well-being. Home life and relationship well-being. Sexual activity. Diet, exercise, and sleep habits. Work and work environment. Access to firearms. Method of birth control. Menstrual cycle. Pregnancy history. What immunizations do I need? Vaccines are usually given at various ages, according to a schedule. Your health care provider will recommend vaccines for you based on your age, medical history, and lifestyle or other factors, such as travel or where you work. What tests do I need? Blood tests Lipid and cholesterol levels. These may be checked every 5 years, or more often if you are over 50 years old. Hepatitis C test. Hepatitis B test. Screening Lung cancer screening. You may have this screening every year starting at age 55 if you have a 30-pack-year history of smoking and currently smoke or have quit within the past 15 years. Colorectal cancer screening. All adults should have this screening starting at  age 50 and continuing until age 75. Your health care provider may recommend screening at age 45 if you are at increased risk. You will have tests every 1-10 years, depending on your results and the type of screening test. Diabetes screening. This is done by checking your blood sugar (glucose) after you have not eaten for a while (fasting). You may have this done every 1-3 years. Mammogram. This may be done every 1-2 years. Talk with your health care provider about when you should start having regular mammograms. This may depend on whether you have a family history of breast cancer. BRCA-related cancer screening. This may be done if you have a family history of breast, ovarian, tubal, or peritoneal cancers. Pelvic exam and Pap test. This may be done every 3 years starting at age 21. Starting at age 30, this may be done every 5 years if you have a Pap test in combination with an HPV test. Other tests STD (sexually transmitted disease) testing, if you are at risk. Bone density scan. This is done to screen for osteoporosis. You may have this scan if you are at high risk for osteoporosis. Talk with your health care provider about your test results, treatment options, and if necessary, the need for more tests. Follow these instructions at home: Eating and drinking  Eat a diet that includes fresh fruits and vegetables, whole grains, lean protein, and low-fat dairy products. Take vitamin and mineral supplements as recommended by your health care provider. Do not drink alcohol if: Your health care provider tells you not to drink. You are pregnant, may be pregnant, or are planning to become pregnant. If you drink alcohol: Limit how much you have to 0-1 drink a day. Be   aware of how much alcohol is in your drink. In the [place] one drink equals one 12 oz bottle of beer (355 mL), one 5 oz glass of wine (148 mL), or one 1 oz glass of hard liquor (44 mL). Lifestyle Take daily care of your teeth and  gums. Brush your teeth every morning and night with fluoride toothpaste. Floss one time each day. Stay active. Exercise for at least 30 minutes 5 or more days each week. Do not use any products that contain nicotine or tobacco, such as cigarettes, e-cigarettes, and chewing tobacco. If you need help quitting, ask your health care provider. Do not use drugs. If you are sexually active, practice safe sex. Use a condom or other form of protection to prevent STIs (sexually transmitted infections). If you do not wish to become pregnant, use a form of birth control. If you plan to become pregnant, see your health care provider for a prepregnancy visit. If told by your health care provider, take low-dose aspirin daily starting at age 63. Find healthy ways to cope with stress, such as: Meditation, yoga, or listening to music. Journaling. Talking to a trusted person. Spending time with friends and family. Safety Always wear your seat belt while driving or riding in a vehicle. Do not drive: If you have been drinking alcohol. Do not ride with someone who has been drinking. When you are tired or distracted. While texting. Wear a helmet and other protective equipment during sports activities. If you have firearms in your house, make sure you follow all gun safety procedures. What's next? Visit your health care provider once a year for an annual wellness visit. Ask your health care provider how often you should have your eyes and teeth checked. Stay up to date on all vaccines. This information is not intended to replace advice given to you by your health care provider. Make sure you discuss any questions you have with your health care provider. Document Revised: 09/20/2020 Document Reviewed: 03/24/2018 Elsevier Patient Education  2022 [employer].

## 2021-04-11 NOTE — Assessment & Plan Note (Signed)
Discussed options  She prefers to try natural options first

## 2021-04-11 NOTE — Assessment & Plan Note (Signed)
Check labs Ok to take tylenol arthritis prn  rto prn

## 2021-04-14 LAB — THYROID PANEL WITH TSH
Free Thyroxine Index: 1.9 (ref 1.4–3.8)
T3 Uptake: 31 % (ref 22–35)
T4, Total: 6.2 ug/dL (ref 5.1–11.9)
TSH: 1.58 mIU/L

## 2021-04-14 LAB — ANA: Anti Nuclear Antibody (ANA): NEGATIVE

## 2021-04-14 LAB — RHEUMATOID FACTOR: Rheumatoid fact SerPl-aCnc: <14 IU/mL (ref <14)

## 2021-04-21 ENCOUNTER — Encounter: Payer: Self-pay | Admitting: Family Medicine

## 2021-06-12 ENCOUNTER — Ambulatory Visit (INDEPENDENT_AMBULATORY_CARE_PROVIDER_SITE_OTHER): Payer: No Typology Code available for payment source

## 2021-06-12 DIAGNOSIS — I469 Cardiac arrest, cause unspecified: Secondary | ICD-10-CM

## 2021-06-12 LAB — CUP PACEART REMOTE DEVICE CHECK
Battery Remaining Longevity: 34 mo
Battery Voltage: 2.93 V
Brady Statistic AP VP Percent: 0.03 %
Brady Statistic AP VS Percent: 61.96 %
Brady Statistic AS VP Percent: 0.02 %
Brady Statistic AS VS Percent: 37.99 %
Brady Statistic RA Percent Paced: 61.95 %
Brady Statistic RV Percent Paced: 0.05 %
Date Time Interrogation Session: 20221117063427
HighPow Impedance: 72 Ohm
Implantable Lead Implant Date: 20160804
Implantable Lead Implant Date: 20160804
Implantable Lead Location: 753859
Implantable Lead Location: 753860
Implantable Lead Model: 5076
Implantable Pulse Generator Implant Date: 20160804
Lead Channel Impedance Value: 418 Ohm
Lead Channel Impedance Value: 456 Ohm
Lead Channel Impedance Value: 513 Ohm
Lead Channel Pacing Threshold Amplitude: 0.5 V
Lead Channel Pacing Threshold Amplitude: 0.625 V
Lead Channel Pacing Threshold Pulse Width: 0.4 ms
Lead Channel Pacing Threshold Pulse Width: 0.4 ms
Lead Channel Sensing Intrinsic Amplitude: 0.875 mV
Lead Channel Sensing Intrinsic Amplitude: 0.875 mV
Lead Channel Sensing Intrinsic Amplitude: 8.25 mV
Lead Channel Sensing Intrinsic Amplitude: 8.25 mV
Lead Channel Setting Pacing Amplitude: 1.5 V
Lead Channel Setting Pacing Amplitude: 2 V
Lead Channel Setting Pacing Pulse Width: 0.4 ms
Lead Channel Setting Sensing Sensitivity: 0.3 mV

## 2021-06-23 NOTE — Progress Notes (Signed)
Remote ICD transmission.   

## 2021-09-26 ENCOUNTER — Ambulatory Visit (INDEPENDENT_AMBULATORY_CARE_PROVIDER_SITE_OTHER): Payer: No Typology Code available for payment source

## 2021-09-26 DIAGNOSIS — I469 Cardiac arrest, cause unspecified: Secondary | ICD-10-CM

## 2021-09-26 LAB — CUP PACEART REMOTE DEVICE CHECK
Battery Remaining Longevity: 27 mo
Battery Voltage: 2.93 V
Brady Statistic AP VP Percent: 0.05 %
Brady Statistic AP VS Percent: 71.52 %
Brady Statistic AS VP Percent: 0.01 %
Brady Statistic AS VS Percent: 28.42 %
Brady Statistic RA Percent Paced: 71.47 %
Brady Statistic RV Percent Paced: 0.06 %
Date Time Interrogation Session: 20230303094622
HighPow Impedance: 63 Ohm
Implantable Lead Implant Date: 20160804
Implantable Lead Implant Date: 20160804
Implantable Lead Location: 753859
Implantable Lead Location: 753860
Implantable Lead Model: 5076
Implantable Pulse Generator Implant Date: 20160804
Lead Channel Impedance Value: 418 Ohm
Lead Channel Impedance Value: 475 Ohm
Lead Channel Impedance Value: 551 Ohm
Lead Channel Pacing Threshold Amplitude: 0.5 V
Lead Channel Pacing Threshold Amplitude: 0.625 V
Lead Channel Pacing Threshold Pulse Width: 0.4 ms
Lead Channel Pacing Threshold Pulse Width: 0.4 ms
Lead Channel Sensing Intrinsic Amplitude: 1.375 mV
Lead Channel Sensing Intrinsic Amplitude: 1.375 mV
Lead Channel Sensing Intrinsic Amplitude: 7.75 mV
Lead Channel Sensing Intrinsic Amplitude: 7.75 mV
Lead Channel Setting Pacing Amplitude: 1.5 V
Lead Channel Setting Pacing Amplitude: 2 V
Lead Channel Setting Pacing Pulse Width: 0.4 ms
Lead Channel Setting Sensing Sensitivity: 0.3 mV

## 2021-09-27 ENCOUNTER — Other Ambulatory Visit: Payer: Self-pay | Admitting: Internal Medicine

## 2021-10-03 NOTE — Progress Notes (Signed)
Remote ICD transmission.   

## 2021-10-31 ENCOUNTER — Ambulatory Visit: Payer: No Typology Code available for payment source | Admitting: Internal Medicine

## 2021-10-31 ENCOUNTER — Encounter: Payer: Self-pay | Admitting: Internal Medicine

## 2021-10-31 VITALS — BP 108/80 | HR 76 | Ht 63.0 in | Wt 154.6 lb

## 2021-10-31 DIAGNOSIS — I495 Sick sinus syndrome: Secondary | ICD-10-CM | POA: Diagnosis not present

## 2021-10-31 DIAGNOSIS — Z95810 Presence of automatic (implantable) cardiac defibrillator: Secondary | ICD-10-CM | POA: Diagnosis not present

## 2021-10-31 DIAGNOSIS — I469 Cardiac arrest, cause unspecified: Secondary | ICD-10-CM

## 2021-10-31 DIAGNOSIS — I422 Other hypertrophic cardiomyopathy: Secondary | ICD-10-CM

## 2021-10-31 NOTE — Patient Instructions (Signed)
Medication Instructions:  ?Your physician recommends that you continue on your current medications as directed. Please refer to the Current Medication list given to you today. ? ?Labwork: ?None ordered. ? ?Testing/Procedures: ?None ordered. ? ?Follow-Up: ?Your physician wants you to follow-up in: one year with Cristopher Peru, MD or one of the following Advanced Practice Providers on your designated Care Team:   ?Tommye Standard, PA-C ?Legrand Como "Jonni Sanger" [place], PA-C ?You will receive a reminder letter in the mail two months in advance. If you don't receive a letter, please call our office to schedule the follow-up appointment. ? ?Remote monitoring is used to monitor your ICD from home. This monitoring reduces the number of office visits required to check your device to one time per year. It allows [place] to keep an eye on the functioning of your device to ensure it is working properly. You are scheduled for a device check from home on 12/26/2021. You may send your transmission at any time that day. If you have a wireless device, the transmission will be sent automatically. After your physician reviews your transmission, you will receive a postcard with your next transmission date. ? ?Any Other Special Instructions Will Be Listed Below (If Applicable). ? ?If you need a refill on your cardiac medications before your next appointment, please call your pharmacy.  ? ? ? ? ?

## 2021-10-31 NOTE — Progress Notes (Signed)
? ? ? ? ?HPI ?Teresa Henry returns today for followup. She is a 56 yo woman with a h/o syncope who was found to have VF and successfully defibrillated while in the hospital over 6 years ago. Her QT interval was increased and she was given a presumptive diagnosis of long QT. She was noted to have an LV thickness of nearly 16 mm. She also had some sinus bradycardia. She underwent insertion of a DDD ICD and has undergone genetic testing which failed to reveal a QT prolonging gene and most recently no evidence of HCM. In the interim she has done well on nadolol with no additional ventricular arrhythmias. She has tried to lose weight by walking as well as dieting but was frustrated that she lost only a few pounds and none recently. ?No Known Allergies ? ? ?Current Outpatient Medications  ?Medication Sig Dispense Refill  ? aspirin 81 MG tablet Take 81 mg by mouth daily.    ? b complex vitamins tablet Take 1 tablet by mouth daily.    ? Cholecalciferol (D-3-5) 125 MCG (5000 UT) capsule Take 5,000 Units by mouth daily.    ? Krill Oil 500 MG CAPS Take by mouth.    ? nadolol (CORGARD) 40 MG tablet TAKE ONE & ONE-HALF TABLETS (60 MG) BY MOUTH TWICE DAILY 270 tablet 0  ? QUERCETIN PO Take by mouth daily.    ? vitamin C (ASCORBIC ACID) 500 MG tablet Take 500 mg by mouth daily.    ? Zinc 50 MG TABS Take 1 tablet by mouth daily.    ? ?No current facility-administered medications for this visit.  ? ? ? ?Past Medical History:  ?Diagnosis Date  ? Cardiac arrest [facility])   ? a. s/p MDT dual chamber ICD  ? Frequent episodic tension-type headache   ? Prolonged Q-T interval on ECG   ? ? ?ROS: ? ? All systems reviewed and negative except as noted in the HPI. ? ? ?Past Surgical History:  ?Procedure Laterality Date  ? BREAST BIOPSY  1999  ? Needle Biopsy- Non cancer  ? CARDIAC CATHETERIZATION N/A 02/28/2015  ? Procedure: Left Heart Cath and Coronary Angiography;  Surgeon: Belva Crome, MD;  Location: [place] [facility];  Service:  Cardiovascular;  Laterality: N/A;  ? EP IMPLANTABLE DEVICE N/A 02/28/2015  ? MDT dual chamber ICD implanted by Dr Rayann Heman for secondary prevention  ? ? ? ?Family History  ?Problem Relation Age of Onset  ? Arthritis Mother   ? Hypertension Mother   ? Colon polyps Mother   ? Stroke Mother   ? Arthritis Father   ? Hypertension Father   ? Colon polyps Father   ? Heart attack Brother 43  ? Stroke Maternal Grandmother   ? Stroke Paternal Grandmother   ? Colon cancer Neg Hx   ? Esophageal cancer Neg Hx   ? Rectal cancer Neg Hx   ? Stomach cancer Neg Hx   ? ? ? ?Social History  ? ?Socioeconomic History  ? Marital status: Married  ?  Spouse name: Not on file  ? Number of children: Not on file  ? Years of education: Not on file  ? Highest education level: Not on file  ?Occupational History  ? Occupation: Agricultural engineer-- self employed  ?Tobacco Use  ? Smoking status: Never  ? Smokeless tobacco: Never  ?Vaping Use  ? Vaping Use: Never used  ?Substance and Sexual Activity  ? Alcohol use: No  ? Drug use: No  ?  Sexual activity: Yes  ?  Partners: Male  ?Other Topics Concern  ? Not on file  ?Social History Narrative  ? Not on file  ? ?Social Determinants of Health  ? ?Financial Resource Strain: Not on file  ?Food Insecurity: Not on file  ?Transportation Needs: Not on file  ?Physical Activity: Not on file  ?Stress: Not on file  ?Social Connections: Not on file  ?Intimate Partner Violence: Not on file  ? ? ? ?BP 108/80   Pulse 76   Ht '5\' 3"'$  (1.6 m)   Wt 154 lb 9.6 oz (70.1 kg)   SpO2 97%   BMI 27.39 kg/m?  ? ?Physical Exam: ? ?Well appearing NAD ?HEENT: Unremarkable ?Neck:  No JVD, no thyromegally ?Lymphatics:  No adenopathy ?Back:  No CVA tenderness ?Lungs:  Clear ?HEART:  Regular rate rhythm, no murmurs, no rubs, no clicks ?Abd:  soft, positive bowel sounds, no organomegally, no rebound, no guarding ?Ext:  2 plus pulses, no edema, no cyanosis, no clubbing ?Skin:  No rashes no nodules ?Neuro:  CN II through XII intact, motor  grossly intact ? ?EKG - nsr with first degree AV block ? ?DEVICE  ?Normal device function.  See PaceArt for details.  ? ?Assess/Plan:  ?1. 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 She has not had any additional arrhythmia. Her ECG does not look like HCM however.  ?2. ICD - her medtronic DDD ICD is working normally.  ?3. HCM - she is asymptomatic. She will continue her current meds.  ?4. Sinus node dysfunction - she is pacing 65% of the time.  ?  ?Jurnee Nakayama,M.D. ?

## 2021-11-03 NOTE — Addendum Note (Signed)
Addended by: Gaetano Net on: 11/03/2021 10:49 AM ? ? Modules accepted: Orders ? ?

## 2021-12-26 ENCOUNTER — Ambulatory Visit (INDEPENDENT_AMBULATORY_CARE_PROVIDER_SITE_OTHER): Payer: No Typology Code available for payment source

## 2021-12-26 DIAGNOSIS — I469 Cardiac arrest, cause unspecified: Secondary | ICD-10-CM | POA: Diagnosis not present

## 2021-12-27 LAB — CUP PACEART REMOTE DEVICE CHECK
Battery Remaining Longevity: 24 mo
Battery Voltage: 2.93 V
Brady Statistic AP VP Percent: 0.04 %
Brady Statistic AP VS Percent: 75.5 %
Brady Statistic AS VP Percent: 0.01 %
Brady Statistic AS VS Percent: 24.44 %
Brady Statistic RA Percent Paced: 75.53 %
Brady Statistic RV Percent Paced: 0.05 %
Date Time Interrogation Session: 20230602022703
HighPow Impedance: 73 Ohm
Implantable Lead Implant Date: 20160804
Implantable Lead Implant Date: 20160804
Implantable Lead Location: 753859
Implantable Lead Location: 753860
Implantable Lead Model: 5076
Implantable Pulse Generator Implant Date: 20160804
Lead Channel Impedance Value: 456 Ohm
Lead Channel Impedance Value: 532 Ohm
Lead Channel Impedance Value: 646 Ohm
Lead Channel Pacing Threshold Amplitude: 0.5 V
Lead Channel Pacing Threshold Amplitude: 0.625 V
Lead Channel Pacing Threshold Pulse Width: 0.4 ms
Lead Channel Pacing Threshold Pulse Width: 0.4 ms
Lead Channel Sensing Intrinsic Amplitude: 1 mV
Lead Channel Sensing Intrinsic Amplitude: 1 mV
Lead Channel Sensing Intrinsic Amplitude: 7.875 mV
Lead Channel Sensing Intrinsic Amplitude: 7.875 mV
Lead Channel Setting Pacing Amplitude: 1.5 V
Lead Channel Setting Pacing Amplitude: 2 V
Lead Channel Setting Pacing Pulse Width: 0.4 ms
Lead Channel Setting Sensing Sensitivity: 0.3 mV

## 2022-01-01 NOTE — Progress Notes (Signed)
Remote ICD transmission.   

## 2022-03-27 ENCOUNTER — Ambulatory Visit (INDEPENDENT_AMBULATORY_CARE_PROVIDER_SITE_OTHER): Payer: No Typology Code available for payment source

## 2022-03-27 DIAGNOSIS — I495 Sick sinus syndrome: Secondary | ICD-10-CM

## 2022-03-30 LAB — CUP PACEART REMOTE DEVICE CHECK
Battery Remaining Longevity: 20 mo
Battery Voltage: 2.92 V
Brady Statistic AP VP Percent: 0.03 %
Brady Statistic AP VS Percent: 63.09 %
Brady Statistic AS VP Percent: 0.01 %
Brady Statistic AS VS Percent: 36.87 %
Brady Statistic RA Percent Paced: 63.1 %
Brady Statistic RV Percent Paced: 0.05 %
Date Time Interrogation Session: 20230901022824
HighPow Impedance: 67 Ohm
Implantable Lead Implant Date: 20160804
Implantable Lead Implant Date: 20160804
Implantable Lead Location: 753859
Implantable Lead Location: 753860
Implantable Lead Model: 5076
Implantable Pulse Generator Implant Date: 20160804
Lead Channel Impedance Value: 456 Ohm
Lead Channel Impedance Value: 513 Ohm
Lead Channel Impedance Value: 608 Ohm
Lead Channel Pacing Threshold Amplitude: 0.5 V
Lead Channel Pacing Threshold Amplitude: 0.75 V
Lead Channel Pacing Threshold Pulse Width: 0.4 ms
Lead Channel Pacing Threshold Pulse Width: 0.4 ms
Lead Channel Sensing Intrinsic Amplitude: 1.625 mV
Lead Channel Sensing Intrinsic Amplitude: 1.625 mV
Lead Channel Sensing Intrinsic Amplitude: 8 mV
Lead Channel Sensing Intrinsic Amplitude: 8 mV
Lead Channel Setting Pacing Amplitude: 1.5 V
Lead Channel Setting Pacing Amplitude: 2 V
Lead Channel Setting Pacing Pulse Width: 0.4 ms
Lead Channel Setting Sensing Sensitivity: 0.3 mV

## 2022-04-16 ENCOUNTER — Encounter: Payer: No Typology Code available for payment source | Admitting: Family Medicine

## 2022-04-16 IMAGING — US US PELVIS COMPLETE WITH TRANSVAGINAL
1 series · 14 of 25 positions shown · non-contrast
Comparison: None

CLINICAL DATA: Pelvic pain; perimenopausal

EXAM:
TRANSABDOMINAL AND TRANSVAGINAL ULTRASOUND OF PELVIS
TECHNIQUE: Both transabdominal and transvaginal ultrasound examinations of the
pelvis were performed. Transabdominal technique was performed for
global imaging of the pelvis including uterus, ovaries, adnexal
regions, and pelvic cul-de-sac. It was necessary to proceed with
endovaginal exam following the transabdominal exam to visualize the
endometrium and LEFT ovary.

[Series 1: us pelvis complete with transvaginal · 14 of 62 slices shown]
[im 1/62]
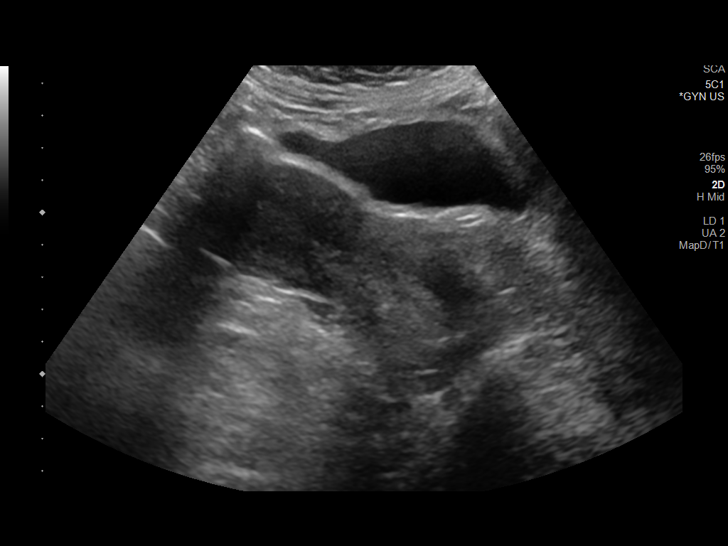
[im 6/62]
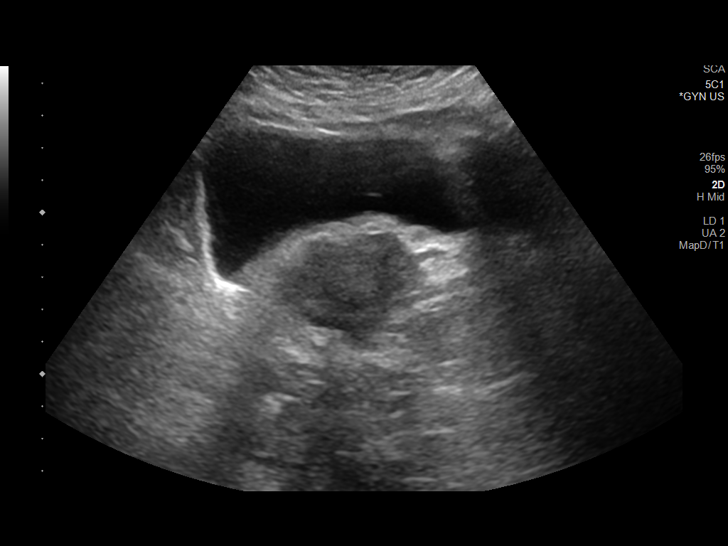
[im 11/62]
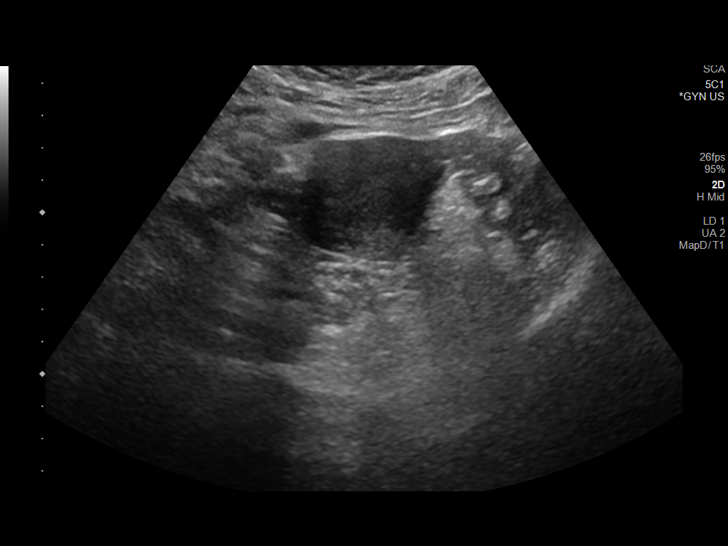
[im 16/62]
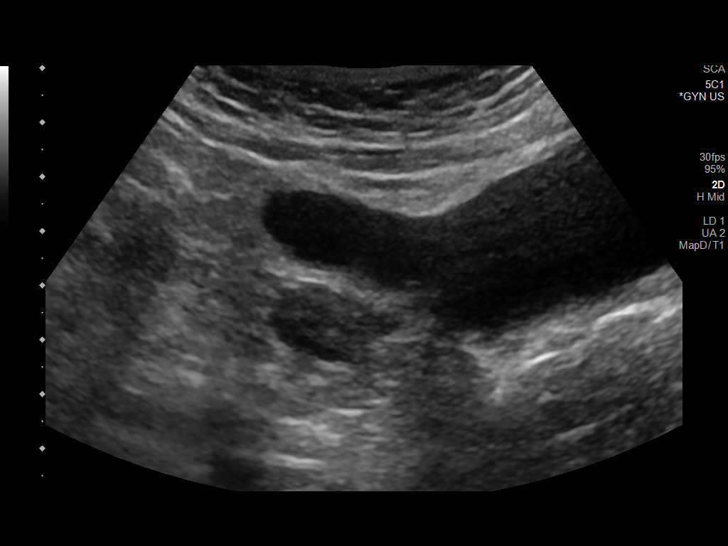
[im 21/62]
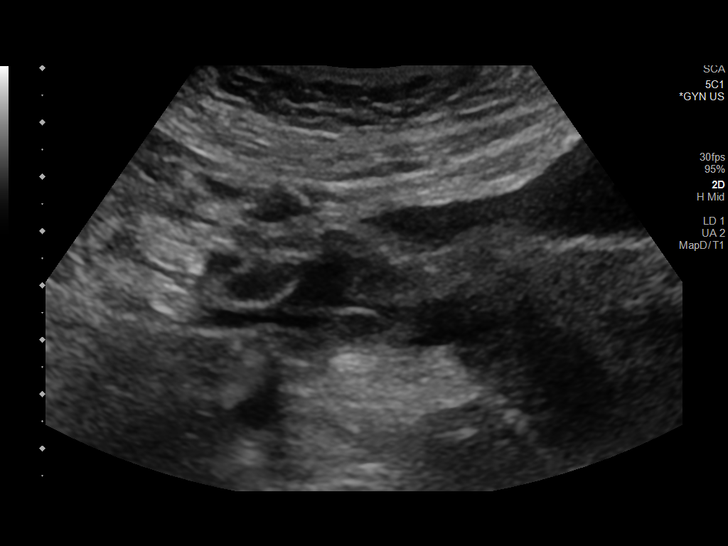
[im 23/62]
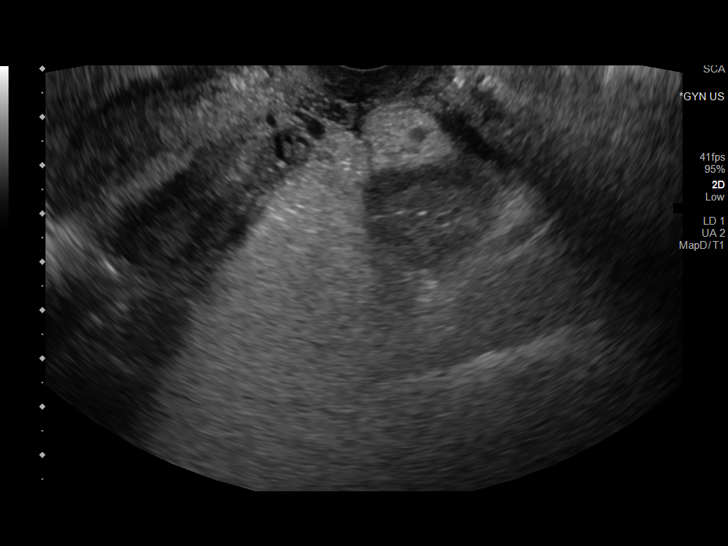
[im 28/62]
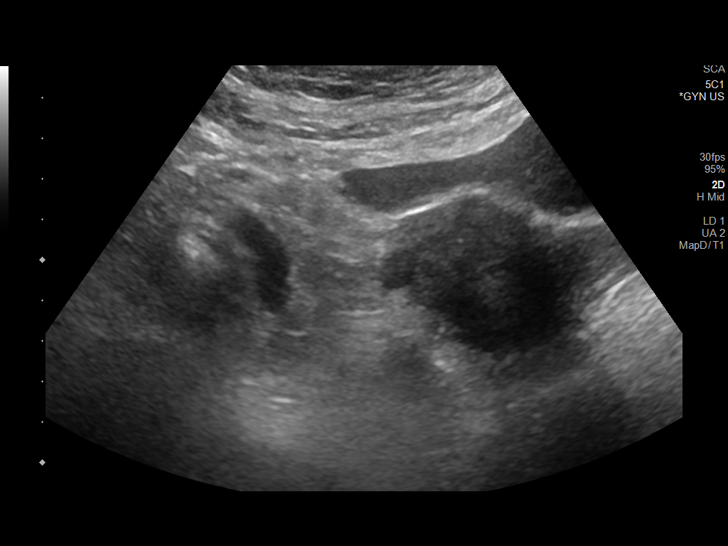
[im 34/62]
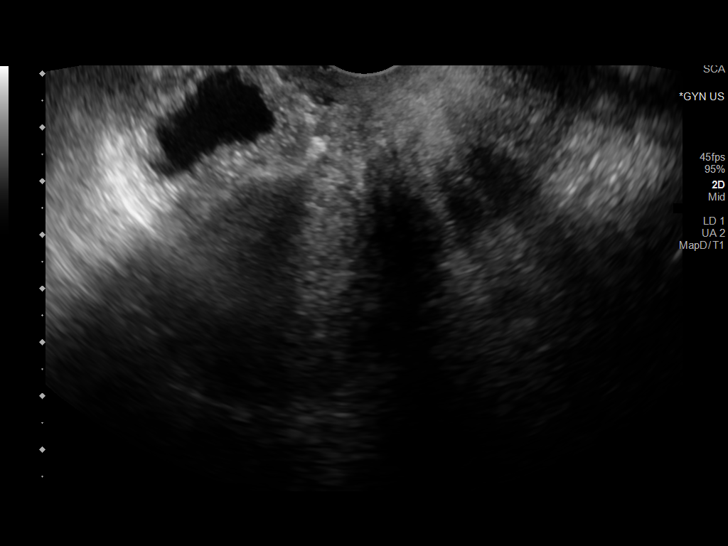
[im 39/62]
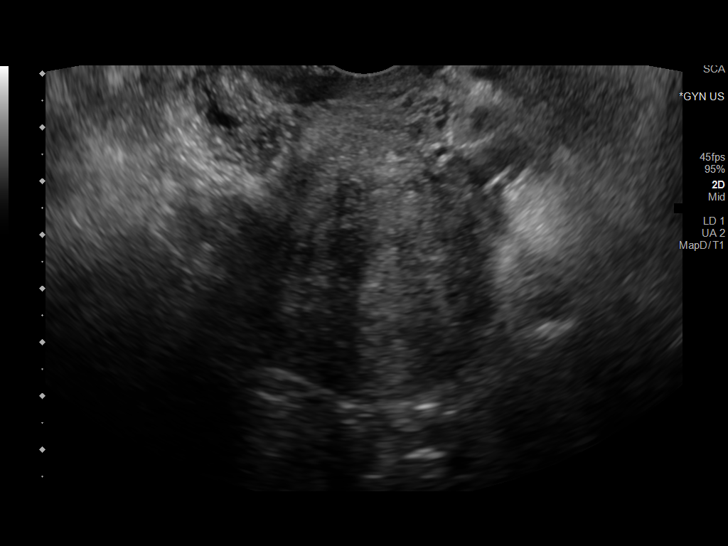
[im 41/62]
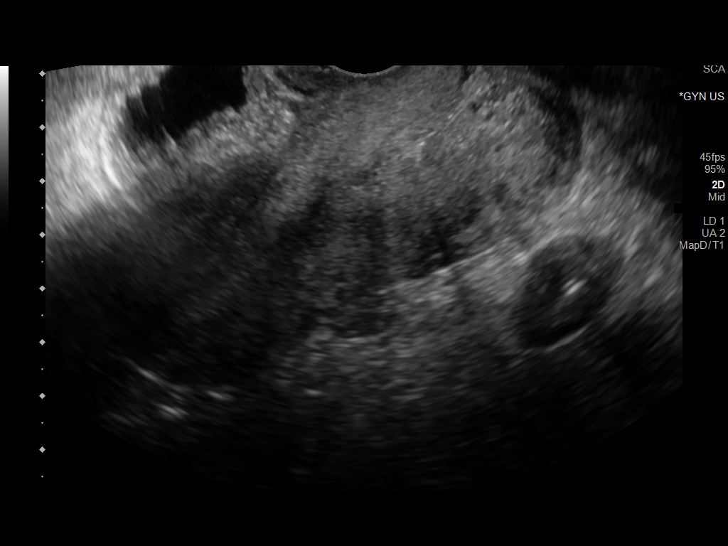
[im 46/62]
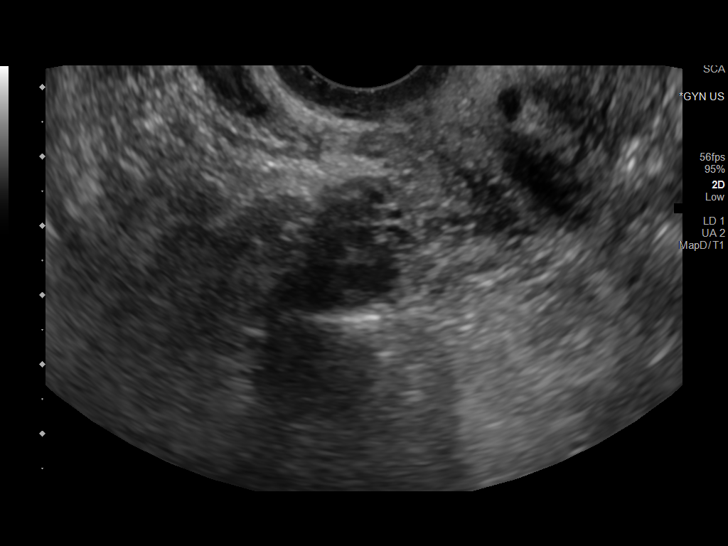
[im 51/62]
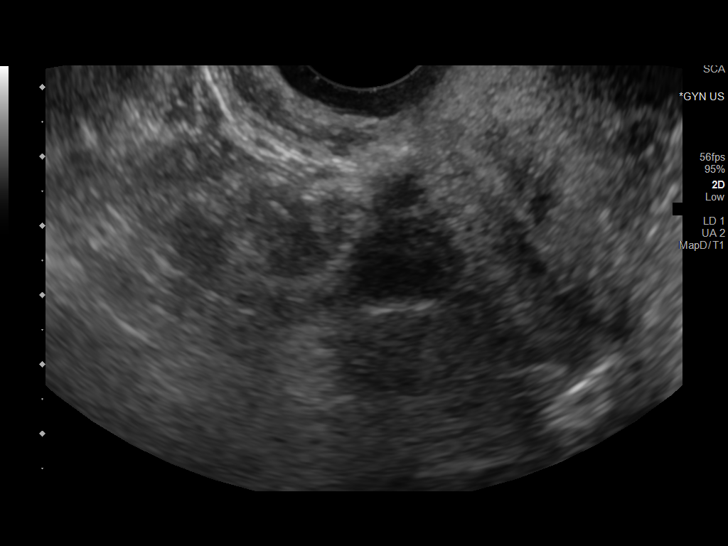
[im 56/62]
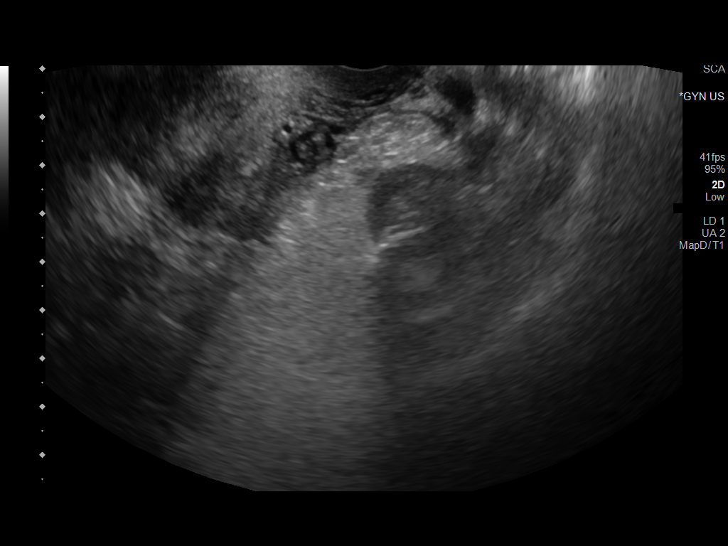
[im 62/62]
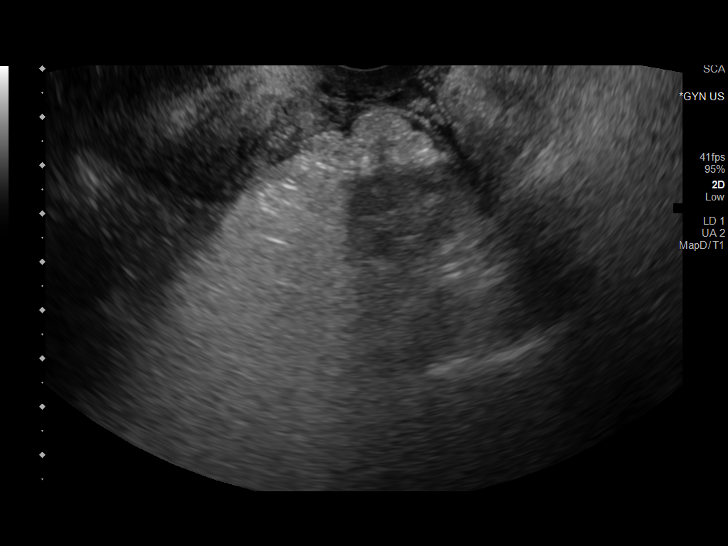

[14 of 25 positions shown; findings below may reference images not displayed]

FINDINGS: Uterus

Measurements: 8.9 x 4.1 x 4.7 cm = volume: 90 mL. Anteverted. Normal
morphology without mass.

Endometrium

Thickness: 8 mm.  No endometrial fluid or focal abnormality.

Right ovary

Measurements: 1.8 x 1.41.6 cm = volume: 2.2 mL. Normal morphology
without mass

Left ovary

Measurements: 3.0 x 2.5 x 1.9 cm = volume: 6.7 mL. Normal morphology
without mass

Other findings

No free pelvic fluid. No adnexal masses. Numerous bowel loops in the
adnexa bilaterally.
IMPRESSION: No pelvic sonographic abnormalities.

## 2022-04-16 NOTE — Progress Notes (Signed)
Remote ICD transmission.   

## 2022-05-19 ENCOUNTER — Encounter: Payer: Self-pay | Admitting: Family Medicine

## 2022-05-19 ENCOUNTER — Ambulatory Visit (INDEPENDENT_AMBULATORY_CARE_PROVIDER_SITE_OTHER): Payer: No Typology Code available for payment source | Admitting: Family Medicine

## 2022-05-19 VITALS — BP 98/70 | HR 61 | Temp 97.5°F | Resp 16 | Ht 63.0 in | Wt 150.0 lb

## 2022-05-19 DIAGNOSIS — Z1211 Encounter for screening for malignant neoplasm of colon: Secondary | ICD-10-CM

## 2022-05-19 DIAGNOSIS — I422 Other hypertrophic cardiomyopathy: Secondary | ICD-10-CM

## 2022-05-19 DIAGNOSIS — Z0000 Encounter for general adult medical examination without abnormal findings: Secondary | ICD-10-CM | POA: Diagnosis not present

## 2022-05-19 DIAGNOSIS — K635 Polyp of colon: Secondary | ICD-10-CM | POA: Diagnosis not present

## 2022-05-19 LAB — COMPREHENSIVE METABOLIC PANEL
ALT: 30 U/L (ref 0–35)
AST: 29 U/L (ref 0–37)
Albumin: 4.7 g/dL (ref 3.5–5.2)
Alkaline Phosphatase: 110 U/L (ref 39–117)
BUN: 16 mg/dL (ref 6–23)
CO2: 30 mEq/L (ref 19–32)
Calcium: 10.3 mg/dL (ref 8.4–10.5)
Chloride: 100 mEq/L (ref 96–112)
Creatinine, Ser: 0.69 mg/dL (ref 0.40–1.20)
GFR: 97.21 mL/min (ref >60.00)
Glucose, Bld: 89 mg/dL (ref 70–99)
Potassium: 4.5 mEq/L (ref 3.5–5.1)
Sodium: 138 mEq/L (ref 135–145)
Total Bilirubin: 0.6 mg/dL (ref 0.2–1.2)
Total Protein: 7.2 g/dL (ref 6.0–8.3)

## 2022-05-19 LAB — CBC WITH DIFFERENTIAL/PLATELET
Basophils Absolute: 0.1 10*3/uL (ref 0.0–0.1)
Basophils Relative: 0.7 % (ref 0.0–3.0)
Eosinophils Absolute: 0.3 10*3/uL (ref 0.0–0.7)
Eosinophils Relative: 4.4 % (ref 0.0–5.0)
HCT: 44.6 % (ref 36.0–46.0)
Hemoglobin: 14.9 g/dL (ref 12.0–15.0)
Lymphocytes Relative: 36.5 % (ref 12.0–46.0)
Lymphs Abs: 2.8 10*3/uL (ref 0.7–4.0)
MCHC: 33.3 g/dL (ref 30.0–36.0)
MCV: 85.3 fl (ref 78.0–100.0)
Monocytes Absolute: 0.6 10*3/uL (ref 0.1–1.0)
Monocytes Relative: 7.5 % (ref 3.0–12.0)
Neutro Abs: 4 10*3/uL (ref 1.4–7.7)
Neutrophils Relative %: 50.9 % (ref 43.0–77.0)
Platelets: 251 10*3/uL (ref 150.0–400.0)
RBC: 5.23 Mil/uL — ABNORMAL HIGH (ref 3.87–5.11)
RDW: 13.2 % (ref 11.5–15.5)
WBC: 7.8 10*3/uL (ref 4.0–10.5)

## 2022-05-19 LAB — LIPID PANEL
Cholesterol: 205 mg/dL — ABNORMAL HIGH (ref 0–200)
HDL: 44.2 mg/dL (ref >39.00)
LDL Cholesterol: 131 mg/dL — ABNORMAL HIGH (ref 0–99)
NonHDL: 160.8
Total CHOL/HDL Ratio: 5
Triglycerides: 147 mg/dL (ref 0.0–149.0)
VLDL: 29.4 mg/dL (ref 0.0–40.0)

## 2022-05-19 LAB — VITAMIN B12: Vitamin B-12: 647 pg/mL (ref 211–911)

## 2022-05-19 LAB — TSH: TSH: 1.18 u[IU]/mL (ref 0.35–5.50)

## 2022-05-19 LAB — VITAMIN D 25 HYDROXY (VIT D DEFICIENCY, FRACTURES): VITD: 33.6 ng/mL (ref 30.00–100.00)

## 2022-05-19 NOTE — Assessment & Plan Note (Signed)
Per cardiology 

## 2022-05-19 NOTE — Patient Instructions (Signed)
Preventive Care 40-56 Years Old, Female Preventive care refers to lifestyle choices and visits with your health care provider that can promote health and wellness. Preventive care visits are also called wellness exams. What can I expect for my preventive care visit? Counseling Your health care provider may ask you questions about your: Medical history, including: Past medical problems. Family medical history. Pregnancy history. Current health, including: Menstrual cycle. Method of birth control. Emotional well-being. Home life and relationship well-being. Sexual activity and sexual health. Lifestyle, including: Alcohol, nicotine or tobacco, and drug use. Access to firearms. Diet, exercise, and sleep habits. Work and work environment. Sunscreen use. Safety issues such as seatbelt and bike helmet use. Physical exam Your health care provider will check your: Height and weight. These may be used to calculate your BMI (body mass index). BMI is a measurement that tells if you are at a healthy weight. Waist circumference. This measures the distance around your waistline. This measurement also tells if you are at a healthy weight and may help predict your risk of certain diseases, such as type 2 diabetes and high blood pressure. Heart rate and blood pressure. Body temperature. Skin for abnormal spots. What immunizations do I need?  Vaccines are usually given at various ages, according to a schedule. Your health care provider will recommend vaccines for you based on your age, medical history, and lifestyle or other factors, such as travel or where you work. What tests do I need? Screening Your health care provider may recommend screening tests for certain conditions. This may include: Lipid and cholesterol levels. Diabetes screening. This is done by checking your blood sugar (glucose) after you have not eaten for a while (fasting). Pelvic exam and Pap test. Hepatitis B test. Hepatitis C  test. HIV (human immunodeficiency virus) test. STI (sexually transmitted infection) testing, if you are at risk. Lung cancer screening. Colorectal cancer screening. Mammogram. Talk with your health care provider about when you should start having regular mammograms. This may depend on whether you have a family history of breast cancer. BRCA-related cancer screening. This may be done if you have a family history of breast, ovarian, tubal, or peritoneal cancers. Bone density scan. This is done to screen for osteoporosis. Talk with your health care provider about your test results, treatment options, and if necessary, the need for more tests. Follow these instructions at home: Eating and drinking  Eat a diet that includes fresh fruits and vegetables, whole grains, lean protein, and low-fat dairy products. Take vitamin and mineral supplements as recommended by your health care provider. Do not drink alcohol if: Your health care provider tells you not to drink. You are pregnant, may be pregnant, or are planning to become pregnant. If you drink alcohol: Limit how much you have to 0-1 drink a day. Know how much alcohol is in your drink. In the [place] one drink equals one 12 oz bottle of beer (355 mL), one 5 oz glass of wine (148 mL), or one 1 oz glass of hard liquor (44 mL). Lifestyle Brush your teeth every morning and night with fluoride toothpaste. Floss one time each day. Exercise for at least 30 minutes 5 or more days each week. Do not use any products that contain nicotine or tobacco. These products include cigarettes, chewing tobacco, and vaping devices, such as e-cigarettes. If you need help quitting, ask your health care provider. Do not use drugs. If you are sexually active, practice safe sex. Use a condom or other form of protection to   prevent STIs. If you do not wish to become pregnant, use a form of birth control. If you plan to become pregnant, see your health care provider for a  prepregnancy visit. Take aspirin only as told by your health care provider. Make sure that you understand how much to take and what form to take. Work with your health care provider to find out whether it is safe and beneficial for you to take aspirin daily. Find healthy ways to manage stress, such as: Meditation, yoga, or listening to music. Journaling. Talking to a trusted person. Spending time with friends and family. Minimize exposure to UV radiation to reduce your risk of skin cancer. Safety Always wear your seat belt while driving or riding in a vehicle. Do not drive: If you have been drinking alcohol. Do not ride with someone who has been drinking. When you are tired or distracted. While texting. If you have been using any mind-altering substances or drugs. Wear a helmet and other protective equipment during sports activities. If you have firearms in your house, make sure you follow all gun safety procedures. Seek help if you have been physically or sexually abused. What's next? Visit your health care provider once a year for an annual wellness visit. Ask your health care provider how often you should have your eyes and teeth checked. Stay up to date on all vaccines. This information is not intended to replace advice given to you by your health care provider. Make sure you discuss any questions you have with your health care provider. Document Revised: 01/08/2021 Document Reviewed: 01/08/2021 Elsevier Patient Education  [employer].

## 2022-05-19 NOTE — Assessment & Plan Note (Signed)
ghm utd Check labs  See avs  

## 2022-05-19 NOTE — Progress Notes (Addendum)
Subjective:   By signing my name below, I, Teresa Henry, attest that this documentation has been prepared under the direction and in the presence of Teresa Henry, 05/19/2022.   Patient ID: Teresa Henry, female    DOB: [birth year], 56 y.o.   MRN: [id number]  Chief Complaint  Patient presents with   Annual Exam    Pt states fasting     HPI Patient is in today for a comprehensive physical exam.  She denies new moles, itching, chills, fever, hearing loss, sinus pain, congestion, sore throat, cough and hemoptysis, chest pain, palpitations, wheezing, constipation, diarrhea, blood in stool, nausea and vomiting, dysuria, frequency, hematuria, myalgias, depression, anxiety.  Weight Patient is complaining of not losing weight though she is regularly exercising and making better food choices. She is requesting to see a nutritionist for this issue.  Pain Patient is reporting left hip pain but occasionally has right hip pain as well. The pain is not constant, but is frequently occurring with no modifying factors.  Social history- Reports no new surgical procedures. Colonoscopy last completed on 04/20/2018. Immunizations- She is not interested in receiving shingles or tetanus vaccines this visit. Pap smear last completed on 05/01/2013. Mammogram last completed on 11/13/2020. Exercise- She participates in regular exercise. Diet- Patient reports to maintain a well balanced diet.    Health Maintenance Due  Topic Date Due   COVID-19 Vaccine (1) Never done   COLONOSCOPY (Pts 45-59yr Insurance coverage will need to be confirmed)  04/20/2021    Past Medical History:  Diagnosis Date   Cardiac arrest ([facility]    a. s/p MDT dual chamber ICD   Frequent episodic tension-type headache    Prolonged Q-T interval on ECG     Past Surgical History:  Procedure Laterality Date   BREAST BIOPSY  1999   Needle Biopsy- Non cancer   CARDIAC CATHETERIZATION N/A 02/28/2015   Procedure: Left  Heart Cath and Coronary Angiography;  Surgeon: HBelva Crome MD;  [facility];  Service: Cardiovascular;  Laterality: N/A;   EP IMPLANTABLE DEVICE N/A 02/28/2015   MDT dual chamber ICD implanted by Dr ARayann Hemanfor secondary prevention    Family History  Problem Relation Age of Onset   Arthritis Mother    Hypertension Mother    Colon polyps Mother    Stroke Mother    Arthritis Father    Hypertension Father    Colon polyps Father    Heart attack Brother 426  Stroke Maternal Grandmother    Stroke Paternal Grandmother    Colon cancer Neg Hx    Esophageal cancer Neg Hx    Rectal cancer Neg Hx    Stomach cancer Neg Hx     Social History   Socioeconomic History   Marital status: Married    Spouse name: Not on file   Number of children: Not on file   Years of education: Not on file   Highest education level: Not on file  Occupational History   Occupation: pAgricultural engineer- self employed  Tobacco Use   Smoking status: Never   Smokeless tobacco: Never  Vaping Use   Vaping Use: Never used  Substance and Sexual Activity   Alcohol use: No   Drug use: No   Sexual activity: Yes    Partners: Male  Other Topics Concern   Not on file  Social History Narrative   Exercise --  walking 6 days a week-- 3 miles    Social Determinants  of Health   Financial Resource Strain: Not on file  Food Insecurity: Not on file  Transportation Needs: Not on file  Physical Activity: Not on file  Stress: Not on file  Social Connections: Not on file  Intimate Partner Violence: Not on file    Outpatient Medications Prior to Visit  Medication Sig Dispense Refill   aspirin 81 MG tablet Take 81 mg by mouth daily.     b complex vitamins tablet Take 1 tablet by mouth daily.     Cholecalciferol (D-3-5) 125 MCG (5000 UT) capsule Take 5,000 Units by mouth daily.     Krill Oil 500 MG CAPS Take by mouth.     nadolol (CORGARD) 40 MG tablet TAKE ONE & ONE-HALF TABLETS (60 MG) BY MOUTH  TWICE DAILY 270 tablet 0   QUERCETIN PO Take by mouth daily.     vitamin C (ASCORBIC ACID) 500 MG tablet Take 500 mg by mouth daily.     Zinc 50 MG TABS Take 1 tablet by mouth daily.     No facility-administered medications prior to visit.    No Known Allergies  Review of Systems  Constitutional:  Negative for chills, fever and malaise/fatigue.  HENT:  Negative for congestion, sinus pain and sore throat.   Eyes:  Negative for blurred vision.  Respiratory:  Negative for cough, hemoptysis, shortness of breath and wheezing.   Cardiovascular:  Negative for chest pain, palpitations and leg swelling.  Gastrointestinal:  Negative for abdominal pain, blood in stool, constipation, diarrhea, nausea and vomiting.  Genitourinary:  Negative for dysuria, frequency and hematuria.  Musculoskeletal:  Positive for joint pain (both hips). Negative for falls and myalgias.  Skin:  Negative for itching and rash.       (-) new moles  Neurological:  Negative for dizziness, loss of consciousness and headaches.  Endo/Heme/Allergies:  Negative for environmental allergies.  Psychiatric/Behavioral:  Negative for depression. The patient is not nervous/anxious.        Objective:    Physical Exam Vitals and nursing note reviewed.  Constitutional:      General: She is not in acute distress.    Appearance: Normal appearance. She is not ill-appearing.  HENT:     Head: Normocephalic and atraumatic.     Right Ear: Tympanic membrane, ear canal and external ear normal.     Left Ear: Tympanic membrane, ear canal and external ear normal.  Eyes:     Extraocular Movements: Extraocular movements intact.     Pupils: Pupils are equal, round, and reactive to light.  Cardiovascular:     Rate and Rhythm: Normal rate and regular rhythm.     Heart sounds: Normal heart sounds. No murmur heard.    No gallop.  Pulmonary:     Effort: Pulmonary effort is normal. No respiratory distress.     Breath sounds: Normal breath  sounds. No wheezing or rales.  Abdominal:     General: Bowel sounds are normal. There is no distension.     Palpations: Abdomen is soft.     Tenderness: There is no abdominal tenderness. There is no guarding.  Skin:    General: Skin is warm and dry.  Neurological:     Mental Status: She is alert and oriented to person, place, and time.  Psychiatric:        Judgment: Judgment normal.     BP 98/70 (BP Location: Left Arm, Patient Position: Sitting, Cuff Size: Normal)   Pulse 61   Temp (!) 97.5 F (  36.4 C) (Oral)   Resp 16   Ht '5\' 3"'$  (1.6 m)   Wt 150 lb (68 kg)   SpO2 96%   BMI 26.57 kg/m  Wt Readings from Last 3 Encounters:  05/19/22 150 lb (68 kg)  10/31/21 154 lb 9.6 oz (70.1 kg)  04/11/21 148 lb 3.2 oz (67.2 kg)       Assessment & Plan:   Problem List Items Addressed This Visit       Unprioritized   Preventative health care - Primary    ghm utd Check labs See avs      Relevant Orders   CBC with Differential/Platelet (Completed)   Comprehensive metabolic panel (Completed)   Lipid panel (Completed)   TSH (Completed)   Vitamin B12 (Completed)   VITAMIN D 25 Hydroxy (Vit-D Deficiency, Fractures) (Completed)   Other hypertrophic cardiomyopathy [facility])    Per cardiology      Other Visit Diagnoses     Colon cancer screening       Relevant Orders   Ambulatory referral to Gastroenterology   Polyp of colon, unspecified part of colon, unspecified type       Relevant Orders   Ambulatory referral to Gastroenterology      No orders of the defined types were placed in this encounter.   I, Teresa Henry, personally preformed the services described in this documentation.  All medical record entries made by the scribe were at my direction and in my presence.  I have reviewed the chart and discharge instructions (if applicable) and agree that the record reflects my personal performance and is accurate and complete. 05/19/2022.   I,Verona Buck,acting as a  Education administrator for [employer], DO.,have documented all relevant documentation on the behalf of Ann Held, DO,as directed by  Ann Held, DO while in the presence of Ann Held, DO.    Ann Held, DO

## 2022-06-26 ENCOUNTER — Ambulatory Visit (INDEPENDENT_AMBULATORY_CARE_PROVIDER_SITE_OTHER): Payer: BC Managed Care – PPO

## 2022-06-26 DIAGNOSIS — I495 Sick sinus syndrome: Secondary | ICD-10-CM

## 2022-06-27 LAB — CUP PACEART REMOTE DEVICE CHECK
Battery Remaining Longevity: 16 mo
Battery Voltage: 2.92 V
Brady Statistic AP VP Percent: 0.03 %
Brady Statistic AP VS Percent: 50.71 %
Brady Statistic AS VP Percent: 0.02 %
Brady Statistic AS VS Percent: 49.24 %
Brady Statistic RA Percent Paced: 50.72 %
Brady Statistic RV Percent Paced: 0.04 %
Date Time Interrogation Session: 20231201044224
HighPow Impedance: 72 Ohm
Implantable Lead Connection Status: 753985
Implantable Lead Connection Status: 753985
Implantable Lead Implant Date: 20160804
Implantable Lead Implant Date: 20160804
Implantable Lead Location: 753859
Implantable Lead Location: 753860
Implantable Lead Model: 5076
Implantable Pulse Generator Implant Date: 20160804
Lead Channel Impedance Value: 418 Ohm
Lead Channel Impedance Value: 456 Ohm
Lead Channel Impedance Value: 513 Ohm
Lead Channel Pacing Threshold Amplitude: 0.5 V
Lead Channel Pacing Threshold Amplitude: 0.625 V
Lead Channel Pacing Threshold Pulse Width: 0.4 ms
Lead Channel Pacing Threshold Pulse Width: 0.4 ms
Lead Channel Sensing Intrinsic Amplitude: 1.625 mV
Lead Channel Sensing Intrinsic Amplitude: 1.625 mV
Lead Channel Sensing Intrinsic Amplitude: 7.875 mV
Lead Channel Sensing Intrinsic Amplitude: 7.875 mV
Lead Channel Setting Pacing Amplitude: 1.5 V
Lead Channel Setting Pacing Amplitude: 2 V
Lead Channel Setting Pacing Pulse Width: 0.4 ms
Lead Channel Setting Sensing Sensitivity: 0.3 mV
Zone Setting Status: 755011
Zone Setting Status: 755011

## 2022-07-14 NOTE — Progress Notes (Signed)
Remote ICD transmission.   

## 2022-07-16 ENCOUNTER — Other Ambulatory Visit: Payer: Self-pay | Admitting: Internal Medicine

## 2022-09-25 ENCOUNTER — Ambulatory Visit: Payer: BC Managed Care – PPO

## 2022-09-25 DIAGNOSIS — I495 Sick sinus syndrome: Secondary | ICD-10-CM | POA: Diagnosis not present

## 2022-09-25 LAB — CUP PACEART REMOTE DEVICE CHECK
Battery Remaining Longevity: 19 mo
Battery Voltage: 2.91 V
Brady Statistic AP VP Percent: 0.04 %
Brady Statistic AP VS Percent: 50.23 %
Brady Statistic AS VP Percent: 0.02 %
Brady Statistic AS VS Percent: 49.72 %
Brady Statistic RA Percent Paced: 50.24 %
Brady Statistic RV Percent Paced: 0.05 %
Date Time Interrogation Session: 20240301031605
HighPow Impedance: 69 Ohm
Implantable Lead Connection Status: 753985
Implantable Lead Connection Status: 753985
Implantable Lead Implant Date: 20160804
Implantable Lead Implant Date: 20160804
Implantable Lead Location: 753859
Implantable Lead Location: 753860
Implantable Lead Model: 5076
Implantable Pulse Generator Implant Date: 20160804
Lead Channel Impedance Value: 399 Ohm
Lead Channel Impedance Value: 418 Ohm
Lead Channel Impedance Value: 475 Ohm
Lead Channel Pacing Threshold Amplitude: 0.5 V
Lead Channel Pacing Threshold Amplitude: 0.625 V
Lead Channel Pacing Threshold Pulse Width: 0.4 ms
Lead Channel Pacing Threshold Pulse Width: 0.4 ms
Lead Channel Sensing Intrinsic Amplitude: 1.625 mV
Lead Channel Sensing Intrinsic Amplitude: 1.625 mV
Lead Channel Sensing Intrinsic Amplitude: 8 mV
Lead Channel Sensing Intrinsic Amplitude: 8 mV
Lead Channel Setting Pacing Amplitude: 1.5 V
Lead Channel Setting Pacing Amplitude: 2 V
Lead Channel Setting Pacing Pulse Width: 0.4 ms
Lead Channel Setting Sensing Sensitivity: 0.3 mV
Zone Setting Status: 755011
Zone Setting Status: 755011

## 2022-10-16 NOTE — Progress Notes (Signed)
Remote ICD transmission.   

## 2022-11-05 ENCOUNTER — Ambulatory Visit: Payer: BC Managed Care – PPO | Attending: Internal Medicine | Admitting: Internal Medicine

## 2022-11-05 ENCOUNTER — Encounter: Payer: Self-pay | Admitting: Internal Medicine

## 2022-11-05 VITALS — BP 102/68 | HR 61 | Ht 63.0 in | Wt 152.6 lb

## 2022-11-05 DIAGNOSIS — I469 Cardiac arrest, cause unspecified: Secondary | ICD-10-CM | POA: Diagnosis not present

## 2022-11-05 DIAGNOSIS — I422 Other hypertrophic cardiomyopathy: Secondary | ICD-10-CM

## 2022-11-05 DIAGNOSIS — Z95810 Presence of automatic (implantable) cardiac defibrillator: Secondary | ICD-10-CM

## 2022-11-05 NOTE — Progress Notes (Signed)
HPI Teresa Henry returns today for followup. She is a 57 yo woman with a h/o syncope who was found to have VF and successfully defibrillated while in the hospital over 6 years ago. Her QT interval was increased and she was given a presumptive diagnosis of long QT. She was noted to have an LV thickness of nearly 16 mm. She also had some sinus bradycardia. She underwent insertion of a DDD ICD and has undergone genetic testing which failed to reveal a QT prolonging gene and most recently no evidence of HCM. In the interim she has done well on nadolol with no additional ventricular arrhythmias.   No Known Allergies   Current Outpatient Medications  Medication Sig Dispense Refill   aspirin 81 MG tablet Take 81 mg by mouth daily.     b complex vitamins tablet Take 1 tablet by mouth daily.     Cholecalciferol (D-3-5) 125 MCG (5000 UT) capsule Take 5,000 Units by mouth daily.     Krill Oil 500 MG CAPS Take by mouth.     nadolol (CORGARD) 40 MG tablet TAKE 1 AND 1/2 TABLETS BY MOUTH TWICE A DAY. 270 tablet 1   QUERCETIN PO Take by mouth daily.     vitamin C (ASCORBIC ACID) 500 MG tablet Take 500 mg by mouth daily.     Zinc 50 MG TABS Take 1 tablet by mouth daily.     No current facility-administered medications for this visit.     Past Medical History:  Diagnosis Date   Cardiac arrest    a. s/p MDT dual chamber ICD   Frequent episodic tension-type headache    Prolonged Q-T interval on ECG     ROS:   All systems reviewed and negative except as noted in the HPI.   Past Surgical History:  Procedure Laterality Date   BREAST BIOPSY  1999   Needle Biopsy- Non cancer   CARDIAC CATHETERIZATION N/A 02/28/2015   Procedure: Left Heart Cath and Coronary Angiography;  Surgeon: Lyn Records, MD;  Location: [facility];  Service: Cardiovascular;  Laterality: N/A;   EP IMPLANTABLE DEVICE N/A 02/28/2015   MDT dual chamber ICD implanted by Dr Johney Frame for secondary prevention      Family History  Problem Relation Age of Onset   Arthritis Mother    Hypertension Mother    Colon polyps Mother    Stroke Mother    Arthritis Father    Hypertension Father    Colon polyps Father    Heart attack Brother 53   Stroke Maternal Grandmother    Stroke Paternal Grandmother    Colon cancer Neg Hx    Esophageal cancer Neg Hx    Rectal cancer Neg Hx    Stomach cancer Neg Hx      Social History   Socioeconomic History   Marital status: Married    Spouse name: Not on file   Number of children: Not on file   Years of education: Not on file   Highest education level: Not on file  Occupational History   Occupation: Warehouse manager-- self employed  Tobacco Use   Smoking status: Never   Smokeless tobacco: Never  Vaping Use   Vaping Use: Never used  Substance and Sexual Activity   Alcohol use: No   Drug use: No   Sexual activity: Yes    Partners: Male  Other Topics Concern   Not on file  Social History Narrative   Exercise --  walking 6 days a week-- 3 miles    Social Determinants of Corporate investment banker Strain: Not on file  Food Insecurity: Not on file  Transportation Needs: Not on file  Physical Activity: Not on file  Stress: Not on file  Social Connections: Not on file  Intimate Partner Violence: Not on file     BP 102/68   Pulse 61   Ht 5\' 3"  (1.6 m)   Wt 152 lb 9.6 oz (69.2 kg)   SpO2 96%   BMI 27.03 kg/m   Physical Exam:  Well appearing NAD HEENT: Unremarkable Neck:  No JVD, no thyromegally Lymphatics:  No adenopathy Back:  No CVA tenderness Lungs:  Clear HEART:  Regular rate rhythm, no murmurs, no rubs, no clicks Abd:  soft, positive bowel sounds, no organomegally, no rebound, no guarding Ext:  2 plus pulses, no edema, no cyanosis, no clubbing Skin:  No rashes no nodules Neuro:  CN II through XII intact, motor grossly intact  EKG - nsr with atrial pacing  DEVICE  Normal device function.  See PaceArt for details.    Assess/Plan: 1. 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 She has not had any additional arrhythmia. Her ECG does not look like HCM however.  2. ICD - her medtronic DDD ICD is working normally.  3. HCM - she is asymptomatic. She will continue her current meds.  4. Sinus node dysfunction - she is pacing 65% of the time.    Leonia Reeves.D.

## 2022-11-05 NOTE — Patient Instructions (Addendum)
Medication Instructions:  Your physician recommends that you continue on your current medications as directed. Please refer to the Current Medication list given to you today.  *If you need a refill on your cardiac medications before your next appointment, please call your pharmacy*  Lab Work: None ordered.  If you have labs (blood work) drawn today and your tests are completely normal, you will receive your results only by: MyChart Message (if you have MyChart) OR A paper copy in the mail If you have any lab test that is abnormal or we need to change your treatment, we will call you to review the results.  Testing/Procedures: None ordered.  Follow-Up: At [facility], you and your health needs are our priority.  As part of our continuing mission to provide you with exceptional heart care, we have created designated Provider Care Teams.  These Care Teams include your primary Cardiologist (physician) and Advanced Practice Providers (APPs -  Physician Assistants and Nurse Practitioners) who all work together to provide you with the care you need, when you need it.  Your next appointment:   1 year(s)  The format for your next appointment:   In Person  Provider:   Lewayne Bunting, MD{or one of the following Advanced Practice Providers on your designated Care Team:   Francis Dowse, [place] Casimiro Needle "Mardelle Matte" Lanna Poche, [place]  Remote monitoring is used to monitor your ICD from home. This monitoring reduces the number of office visits required to check your device to one time per year. It allows [place] to keep an eye on the functioning of your device to ensure it is working properly. You are scheduled for a device check from home on 12/25/22. You may send your transmission at any time that day. If you have a wireless device, the transmission will be sent automatically. After your physician reviews your transmission, you will receive a postcard with your next transmission date.

## 2022-11-10 DIAGNOSIS — L570 Actinic keratosis: Secondary | ICD-10-CM | POA: Diagnosis not present

## 2022-11-10 DIAGNOSIS — L603 Nail dystrophy: Secondary | ICD-10-CM | POA: Diagnosis not present

## 2022-11-10 DIAGNOSIS — L814 Other melanin hyperpigmentation: Secondary | ICD-10-CM | POA: Diagnosis not present

## 2022-11-10 DIAGNOSIS — L821 Other seborrheic keratosis: Secondary | ICD-10-CM | POA: Diagnosis not present

## 2022-11-10 DIAGNOSIS — A499 Bacterial infection, unspecified: Secondary | ICD-10-CM | POA: Diagnosis not present

## 2022-11-10 DIAGNOSIS — C44519 Basal cell carcinoma of skin of other part of trunk: Secondary | ICD-10-CM | POA: Diagnosis not present

## 2022-11-10 NOTE — Addendum Note (Signed)
Addended by: Anselm Pancoast on: 11/10/2022 09:11 AM   Modules accepted: Orders

## 2022-11-11 ENCOUNTER — Telehealth: Payer: Self-pay

## 2022-11-11 ENCOUNTER — Ambulatory Visit (AMBULATORY_SURGERY_CENTER): Payer: BC Managed Care – PPO

## 2022-11-11 ENCOUNTER — Encounter: Payer: Self-pay | Admitting: Gastroenterology

## 2022-11-11 VITALS — Ht 63.0 in | Wt 150.0 lb

## 2022-11-11 DIAGNOSIS — Z86010 Personal history of colonic polyps: Secondary | ICD-10-CM

## 2022-11-11 MED ORDER — NA SULFATE-K SULFATE-MG SULF 17.5-3.13-1.6 GM/177ML PO SOLN: 1.0000 | Freq: Once | ORAL | 0 refills | Status: AC

## 2022-11-11 NOTE — Telephone Encounter (Signed)
[facility]. I just complete this pts PV. She made me aware that the last time she had her colonoscopy done she needed cardiac clearance from Dr. Ladona Ridgel due to her having prolonged  QT interval and not being able to receive certain medications. Could you please send over Clearance to Dr. Bruna Potter office. Colon is scheduled with Dr. Adela Lank on 12/08/22.  Thank you

## 2022-11-11 NOTE — Progress Notes (Signed)
No egg or soy allergy known to patient  No issues known to pt with past sedation with any surgeries or procedures - reports hard to wake up  Patient denies ever being told they had issues or difficulty with intubation  No FH of Malignant Hyperthermia Pt is not on diet pills Pt is not on  home 02  Pt is not on blood thinners  Pt denies issues with constipation  No A fib or A flutter pt does have prolonged QT interval with cardiac arrest in 2016 s/p Medtronic ICD placement no cardiac events since  Have any cardiac testing pending--no  Pt instructed to use Singlecare.com or GoodRx for a price reduction on prep   Patient's chart reviewed by Cathlyn Parsons CNRA prior to previsit and patient appropriate for the [facility].  Previsit completed and red dot placed by patient's name on their procedure day (on provider's schedule).

## 2022-11-11 NOTE — Telephone Encounter (Signed)
[facility] Pre-operative Risk Assessment     Request for surgical clearance:     Endoscopy Procedure  What type of surgery is being performed?     Colonoscopy  When is this surgery scheduled?     12/08/22  What type of clearance is required ?   Medical   Are there any medications that need to be held prior to surgery and how long? N/A  Practice name and name of physician performing surgery?      [facility]  What is your office phone and fax number?      Phone- [phone]  Fax- [phone]  Anesthesia type (None, local, MAC, general) ?       MAC

## 2022-11-11 NOTE — Telephone Encounter (Signed)
Telephone encounter created for medical cardiac clearance - see al;ternate telephone encounter for details. Thanks

## 2022-11-16 NOTE — Telephone Encounter (Signed)
   Primary Cardiologist: None  Chart reviewed as part of pre-operative protocol coverage. Given past medical history and time since last visit, based on ACC/AHA guidelines, Teresa Henry would be at acceptable risk for the planned procedure without further cardiovascular testing.   Patient was advised that if she develops new symptoms prior to surgery to contact our office to arrange a follow-up appointment. She verbalized understanding.  I will route this recommendation to the requesting party via Epic fax function and remove from pre-op pool.  Please call with questions.  Levi Aland, NP-C  11/16/2022, 7:53 AM [address], [address] Office [phone] Fax [phone]

## 2022-11-30 ENCOUNTER — Encounter: Payer: Self-pay | Admitting: Internal Medicine

## 2022-12-08 ENCOUNTER — Encounter: Payer: Self-pay | Admitting: Gastroenterology

## 2022-12-08 ENCOUNTER — Ambulatory Visit (AMBULATORY_SURGERY_CENTER): Payer: BC Managed Care – PPO | Admitting: Gastroenterology

## 2022-12-08 VITALS — BP 102/59 | HR 60 | Temp 98.4°F | Resp 12 | Ht 63.0 in | Wt 150.0 lb

## 2022-12-08 DIAGNOSIS — D123 Benign neoplasm of transverse colon: Secondary | ICD-10-CM

## 2022-12-08 DIAGNOSIS — Z1211 Encounter for screening for malignant neoplasm of colon: Secondary | ICD-10-CM | POA: Diagnosis not present

## 2022-12-08 DIAGNOSIS — Z86010 Personal history of colonic polyps: Secondary | ICD-10-CM

## 2022-12-08 DIAGNOSIS — D128 Benign neoplasm of rectum: Secondary | ICD-10-CM

## 2022-12-08 DIAGNOSIS — D120 Benign neoplasm of cecum: Secondary | ICD-10-CM

## 2022-12-08 DIAGNOSIS — Z09 Encounter for follow-up examination after completed treatment for conditions other than malignant neoplasm: Secondary | ICD-10-CM | POA: Diagnosis not present

## 2022-12-08 MED ORDER — SODIUM CHLORIDE 0.9 % IV SOLN: 500.0000 mL | Freq: Once | INTRAVENOUS | Status: DC

## 2022-12-08 NOTE — Progress Notes (Signed)
Pt's states no medical or surgical changes since previsit or office visit. 

## 2022-12-08 NOTE — Op Note (Signed)
[facility] Patient Name: Teresa Henry Procedure Date: 12/08/2022 9:10 AM MRN: [id number] Endoscopist: Viviann Spare P. Adela Lank , MD, [id number] Age: 57 Referring MD:  Date of Birth: 10/06/1965 Gender: Female Account #: [id number] Procedure:                Colonoscopy Indications:              High risk colon cancer surveillance: Personal                            history of colonic polyps - 3 polyps - TA / SSPs -                            removed 03/2018 Medicines:                Monitored Anesthesia Care Procedure:                Pre-Anesthesia Assessment:                           - Prior to the procedure, a History and Physical                            was performed, and patient medications and                            allergies were reviewed. The patient's tolerance of                            previous anesthesia was also reviewed. The risks                            and benefits of the procedure and the sedation                            options and risks were discussed with the patient.                            All questions were answered, and informed consent                            was obtained. Prior Anticoagulants: The patient has                            taken no anticoagulant or antiplatelet agents. ASA                            Grade Assessment: II - A patient with mild systemic                            disease. After reviewing the risks and benefits,                            the patient was deemed in satisfactory condition to  undergo the procedure.                           After obtaining informed consent, the colonoscope                            was passed under direct vision. Throughout the                            procedure, the patient's blood pressure, pulse, and                            oxygen saturations were monitored continuously. The                            Olympus PCF-H190DL [id number])  Colonoscope was                            introduced through the anus and advanced to the the                            cecum, identified by appendiceal orifice and                            ileocecal valve. The colonoscopy was performed                            without difficulty. The patient tolerated the                            procedure well. The quality of the bowel                            preparation was good. The ileocecal valve,                            appendiceal orifice, and rectum were photographed. Scope In: 9:28:26 AM Scope Out: 9:46:56 AM Scope Withdrawal Time: 0 hours 13 minutes 31 seconds  Total Procedure Duration: 0 hours 18 minutes 30 seconds  Findings:                 The perianal and digital rectal examinations were                            normal.                           A diminutive polyp was found in the cecum. The                            polyp was sessile. The polyp was removed with a                            cold snare. Resection and retrieval were complete.  Two sessile polyps were found in the transverse                            colon. The polyps were 3 mm in size. These polyps                            were removed with a cold snare. Resection and                            retrieval were complete.                           Multiple small-mouthed diverticula were found in                            the sigmoid colon.                           A small polypoid lesion was found in the distal                            rectum. The polyp was sessile. This may be                            hyperplastic polyp, a flat peripheral edge of it                            approximated the dentate line. Most of the tissue                            was removed with a cold snare however tissue near /                            on the dentate line was not. Hopefully benign                            hyperplastic change but  removed to rule out adenoma.                           The colon was tortuous.                           The exam was otherwise without abnormality. Complications:            No immediate complications. Estimated blood loss:                            Minimal. Estimated Blood Loss:     Estimated blood loss was minimal. Impression:               - One diminutive polyp in the cecum, removed with a                            cold snare. Resected and retrieved.                           -  Two 3 mm polyps in the transverse colon, removed                            with a cold snare. Resected and retrieved.                           - Diverticulosis in the sigmoid colon.                           - One small polypoid lesion in the distal rectum                            with some extension of tissue to the dentate line,                            mostly removed with a cold snare except tissue                            extending down to the dentate line - hopefully                            benign hyperplastic change                           - Tortuous colon.                           - The examination was otherwise normal. Recommendation:           - Patient has a contact number available for                            emergencies. The signs and symptoms of potential                            delayed complications were discussed with the                            patient. Return to normal activities tomorrow.                            Written discharge instructions were provided to the                            patient.                           - Resume previous diet.                           - Continue present medications.                           - Await pathology results. Viviann Spare P. Ranetta Armacost, MD 12/08/2022 9:56:04 AM This report has been signed electronically.

## 2022-12-08 NOTE — Progress Notes (Signed)
[facility] History and Physical   Primary Care Physician:  Zola Button, Grayling Congress, DO   Reason for Procedure:   History of colon polyps  Plan:    colonoscopy     HPI: Teresa Henry is a 57 y.o. female  here for colonoscopy surveillance - 3 polyps removed 03/2018 - TA and SSPs.    Patient denies any bowel symptoms at this time. No family history of colon cancer known. Otherwise feels well without any cardiopulmonary symptoms.   I have discussed risks / benefits of anesthesia and endoscopic procedure with Teresa Henry and they wish to proceed with the exams as outlined today.    Past Medical History:  Diagnosis Date   Cardiac arrest [facility])    a. s/p MDT dual chamber ICD   Frequent episodic tension-type headache    Prolonged Q-T interval on ECG     Past Surgical History:  Procedure Laterality Date   BREAST BIOPSY  07/27/1997   Needle Biopsy- Non cancer   CARDIAC CATHETERIZATION N/A 02/28/2015   Procedure: Left Heart Cath and Coronary Angiography;  Surgeon: Lyn Records, MD;  Location: [facility] INVASIVE CV LAB;  Service: Cardiovascular;  Laterality: N/A;   COLONOSCOPY  04/20/2018   EP IMPLANTABLE DEVICE N/A 02/28/2015   MDT dual chamber ICD implanted by Dr Johney Frame for secondary prevention    Prior to Admission medications   Medication Sig Start Date End Date Taking? Authorizing Provider  Acetylcysteine (N-ACETYL CYSTEINE) 600 MG CAPS Take 1 capsule by mouth daily.   Yes [provider]  aspirin 81 MG tablet Take 81 mg by mouth daily.   Yes [provider]  b complex vitamins tablet Take 1 tablet by mouth daily.   Yes [provider]  Boris Lown Oil 500 MG CAPS Take 350 mg by mouth.   Yes [provider]  nadolol (CORGARD) 40 MG tablet TAKE 1 AND 1/2 TABLETS BY MOUTH TWICE A DAY. 07/16/22  Yes Marinus Maw, MD  Probiotic Product (PROBIOTIC DAILY) CAPS Take 1 tablet by mouth daily.   Yes [provider]  QUERCETIN PO  Take 250 mg by mouth daily.   Yes [provider]  vitamin C (ASCORBIC ACID) 500 MG tablet Take 1,000 mg by mouth daily.   Yes [provider]  Vitamin D-Vitamin K (VITAMIN K2-VITAMIN D3 PO) Take 1 tablet by mouth daily. 50 mcg of K2 125 mcg of D3   Yes [provider]  Zinc 50 MG TABS Take 1 tablet by mouth daily.   Yes [provider]    Current Outpatient Medications  Medication Sig Dispense Refill   Acetylcysteine (N-ACETYL CYSTEINE) 600 MG CAPS Take 1 capsule by mouth daily.     aspirin 81 MG tablet Take 81 mg by mouth daily.     b complex vitamins tablet Take 1 tablet by mouth daily.     Krill Oil 500 MG CAPS Take 350 mg by mouth.     nadolol (CORGARD) 40 MG tablet TAKE 1 AND 1/2 TABLETS BY MOUTH TWICE A DAY. 270 tablet 1   Probiotic Product (PROBIOTIC DAILY) CAPS Take 1 tablet by mouth daily.     QUERCETIN PO Take 250 mg by mouth daily.     vitamin C (ASCORBIC ACID) 500 MG tablet Take 1,000 mg by mouth daily.     Vitamin D-Vitamin K (VITAMIN K2-VITAMIN D3 PO) Take 1 tablet by mouth daily. 50 mcg of K2 125 mcg of D3  Zinc 50 MG TABS Take 1 tablet by mouth daily.     Current Facility-Administered Medications  Medication Dose Route Frequency Provider Last Rate Last Admin   0.9 %  sodium chloride infusion  500 mL Intravenous Once Julanne Schlueter, Willaim Rayas, MD        Allergies as of 12/08/2022   (No Known Allergies)    Family History  Problem Relation Age of Onset   Arthritis Mother    Hypertension Mother    Colon polyps Mother    Stroke Mother    Arthritis Father    Hypertension Father    Colon polyps Father    Heart attack Brother 36   Stroke Maternal Grandmother    Stroke Paternal Grandmother    Colon cancer Neg Hx    Esophageal cancer Neg Hx    Rectal cancer Neg Hx    Stomach cancer Neg Hx     Social History   Socioeconomic History   Marital status: Married    Spouse name: Not on file   Number of children: Not on file   Years  of education: Not on file   Highest education level: Not on file  Occupational History   Occupation: Warehouse manager-- self employed  Tobacco Use   Smoking status: Never   Smokeless tobacco: Never  Vaping Use   Vaping Use: Never used  Substance and Sexual Activity   Alcohol use: No   Drug use: No   Sexual activity: Yes    Partners: Male  Other Topics Concern   Not on file  Social History Narrative   Exercise --  walking 6 days a week-- 3 miles    Social Determinants of Health   Financial Resource Strain: Not on file  Food Insecurity: Not on file  Transportation Needs: Not on file  Physical Activity: Not on file  Stress: Not on file  Social Connections: Not on file  Intimate Partner Violence: Not on file    Review of Systems: All other review of systems negative except as mentioned in the HPI.  Physical Exam: Vital signs BP 114/65   Pulse 61   Temp 98.4 F (36.9 C)   Ht 5\' 3"  (1.6 m)   Wt 150 lb (68 kg)   SpO2 100%   BMI 26.57 kg/m   General:   Alert,  Well-developed, pleasant and cooperative in NAD Lungs:  Clear throughout to auscultation.   Heart:  Regular rate and rhythm Abdomen:  Soft, nontender and nondistended.   Neuro/Psych:  Alert and cooperative. Normal mood and affect. A and O x 3  Teresa Rain, MD [facility]

## 2022-12-08 NOTE — Progress Notes (Signed)
A/O x 3, gd SR's, VSS, report to RN 

## 2022-12-08 NOTE — Patient Instructions (Signed)
Handout on polyps and diverticulosis given to patient.  Await pathology results. Resume previous diet and continue present medications. Repeat colonoscopy for surveillance will be determined based off of pathology results.   YOU HAD AN ENDOSCOPIC PROCEDURE TODAY AT THE [facility]:   Refer to the procedure report that was given to you for any specific questions about what was found during the examination.  If the procedure report does not answer your questions, please call your gastroenterologist to clarify.  If you requested that your care partner not be given the details of your procedure findings, then the procedure report has been included in a sealed envelope for you to review at your convenience later.  YOU SHOULD EXPECT: Some feelings of bloating in the abdomen. Passage of more gas than usual.  Walking can help get rid of the air that was put into your GI tract during the procedure and reduce the bloating. If you had a lower endoscopy (such as a colonoscopy or flexible sigmoidoscopy) you may notice spotting of blood in your stool or on the toilet paper. If you underwent a bowel prep for your procedure, you may not have a normal bowel movement for a few days.  Please Note:  You might notice some irritation and congestion in your nose or some drainage.  This is from the oxygen used during your procedure.  There is no need for concern and it should clear up in a day or so.  SYMPTOMS TO REPORT IMMEDIATELY:  Following lower endoscopy (colonoscopy or flexible sigmoidoscopy):  Excessive amounts of blood in the stool  Significant tenderness or worsening of abdominal pains  Swelling of the abdomen that is new, acute  Fever of 100F or higher  For urgent or emergent issues, a gastroenterologist can be reached at any hour by calling [phone]. Do not use MyChart messaging for urgent concerns.    DIET:  We do recommend a small meal at first, but then you may proceed to your  regular diet.  Drink plenty of fluids but you should avoid alcoholic beverages for 24 hours.  ACTIVITY:  You should plan to take it easy for the rest of today and you should NOT DRIVE or use heavy machinery until tomorrow (because of the sedation medicines used during the test).    FOLLOW UP: Our staff will call the number listed on your records the next business day following your procedure.  We will call around 7:15- 8:00 am to check on you and address any questions or concerns that you may have regarding the information given to you following your procedure. If we do not reach you, we will leave a message.     If any biopsies were taken you will be contacted by phone or by letter within the next 1-3 weeks.  Please call us at [phone] if you have not heard about the biopsies in 3 weeks.    SIGNATURES/CONFIDENTIALITY: You and/or your care partner have signed paperwork which will be entered into your electronic medical record.  These signatures attest to the fact that that the information above on your After Visit Summary has been reviewed and is understood.  Full responsibility of the confidentiality of this discharge information lies with you and/or your care-partner. 

## 2022-12-08 NOTE — Progress Notes (Signed)
Called to room to assist during endoscopic procedure.  Patient ID and intended procedure confirmed with present staff. Received instructions for my participation in the procedure from the performing physician.  

## 2022-12-09 ENCOUNTER — Telehealth: Payer: Self-pay

## 2022-12-09 NOTE — Telephone Encounter (Signed)
  Follow up Call-     12/08/2022    8:44 AM  Call back number  Post procedure Call Back phone  # [phone]  Permission to leave phone message Yes     Patient questions:  Do you have a fever, pain , or abdominal swelling? No. Pain Score  0 *  Have you tolerated food without any problems? Yes.    Have you been able to return to your normal activities? Yes.    Do you have any questions about your discharge instructions: Diet   No. Medications  No. Follow up visit  No.  Do you have questions or concerns about your Care? No.  Actions: * If pain score is 4 or above: No action needed, pain <4.

## 2022-12-25 ENCOUNTER — Ambulatory Visit (INDEPENDENT_AMBULATORY_CARE_PROVIDER_SITE_OTHER): Payer: BC Managed Care – PPO

## 2022-12-25 DIAGNOSIS — I495 Sick sinus syndrome: Secondary | ICD-10-CM

## 2022-12-25 LAB — CUP PACEART REMOTE DEVICE CHECK
Battery Remaining Longevity: 17 mo
Battery Voltage: 2.9 V
Brady Statistic AP VP Percent: 0.04 %
Brady Statistic AP VS Percent: 63.25 %
Brady Statistic AS VP Percent: 0.01 %
Brady Statistic AS VS Percent: 36.71 %
Brady Statistic RA Percent Paced: 63.26 %
Brady Statistic RV Percent Paced: 0.05 %
Date Time Interrogation Session: 20240531073823
HighPow Impedance: 65 Ohm
Implantable Lead Connection Status: 753985
Implantable Lead Connection Status: 753985
Implantable Lead Implant Date: 20160804
Implantable Lead Implant Date: 20160804
Implantable Lead Location: 753859
Implantable Lead Location: 753860
Implantable Lead Model: 5076
Implantable Pulse Generator Implant Date: 20160804
Lead Channel Impedance Value: 399 Ohm
Lead Channel Impedance Value: 418 Ohm
Lead Channel Impedance Value: 475 Ohm
Lead Channel Pacing Threshold Amplitude: 0.625 V
Lead Channel Pacing Threshold Amplitude: 0.625 V
Lead Channel Pacing Threshold Pulse Width: 0.4 ms
Lead Channel Pacing Threshold Pulse Width: 0.4 ms
Lead Channel Sensing Intrinsic Amplitude: 1 mV
Lead Channel Sensing Intrinsic Amplitude: 1 mV
Lead Channel Sensing Intrinsic Amplitude: 7.625 mV
Lead Channel Sensing Intrinsic Amplitude: 7.625 mV
Lead Channel Setting Pacing Amplitude: 1.5 V
Lead Channel Setting Pacing Amplitude: 2 V
Lead Channel Setting Pacing Pulse Width: 0.4 ms
Lead Channel Setting Sensing Sensitivity: 0.3 mV
Zone Setting Status: 755011
Zone Setting Status: 755011

## 2023-01-12 NOTE — Progress Notes (Signed)
Remote ICD transmission.   

## 2023-01-15 ENCOUNTER — Other Ambulatory Visit: Payer: Self-pay | Admitting: Internal Medicine

## 2023-01-25 ENCOUNTER — Ambulatory Visit: Payer: Self-pay | Admitting: General Surgery

## 2023-01-25 DIAGNOSIS — D128 Benign neoplasm of rectum: Secondary | ICD-10-CM | POA: Diagnosis not present

## 2023-01-25 NOTE — H&P (Signed)
REFERRING PHYSICIAN:  Ruffin Frederick*  PROVIDER:  Elenora Gamma, MD  MRN: [id number] DOB: 01/22/1966 DATE OF ENCOUNTER: 01/25/2023  Subjective  Chief Complaint: No chief complaint on file.     History of Present Illness: Teresa Henry is a 57 y.o. female who is seen today as an office consultation at the request of Dr. Adela Lank for evaluation of No chief complaint on file. .  Patient per since to the office to discuss transanal excision.  She underwent a colonoscopy on 12/08/2022 and a small polypoid lesion was noted in her rectum which extended down into the dentate line.  Dr. Adela Lank was able to remove a portion of this but was not able to get everything out.  Pathology showed tubular adenoma   Review of Systems: A complete review of systems was obtained from the patient.  I have reviewed this information and discussed as appropriate with the patient.  See HPI as well for other ROS.    Medical History: Past Medical History: Diagnosis Date  Arrhythmia   Hyperlipidemia    There is no problem list on file for this patient.   Past Surgical History: Procedure Laterality Date  MASTECTOMY PARTIAL / LUMPECTOMY  1999    No Known Allergies  Current Outpatient Medications on File Prior to Visit Medication Sig Dispense Refill  nadoloL (CORGARD) 40 MG tablet Take 1.5 tablets by mouth 2 (two) times daily    acetylcysteine 600 mg capsule Take 1 capsule by mouth once daily    ascorbic acid, vitamin C, (VITAMIN C) 500 MG tablet Take 1,000 mg by mouth once daily    aspirin 81 MG EC tablet Take 81 mg by mouth once daily    b complex vitamins tablet Take 1 tablet by mouth once daily    calcium-vitamin D3-vitamin K (CALCIUM FOR WOMEN) 500-100-40 mg-unit-mcg Chew Take by mouth    krill oil 500 mg Cap Take by mouth    quercetin dihydrate, bulk, 100 % Powd Take 250 mg by mouth once daily    zinc gluconate 50 mg tablet Take 1 tablet by mouth once daily    No  current facility-administered medications on file prior to visit.   Family History Problem Relation Age of Onset  Stroke Mother   High blood pressure (Hypertension) Mother   Hyperlipidemia (Elevated cholesterol) Mother   Skin cancer Father   High blood pressure (Hypertension) Father   Hyperlipidemia (Elevated cholesterol) Father   High blood pressure (Hypertension) Brother   Coronary Artery Disease (Blocked arteries around heart) Brother     Social History  Tobacco Use Smoking Status Never Smokeless Tobacco Never    Social History  Socioeconomic History  Marital status: Married Tobacco Use  Smoking status: Never  Smokeless tobacco: Never Vaping Use  Vaping status: Never Used Substance and Sexual Activity  Alcohol use: Never  Drug use: Never   Objective:   Vitals:  01/25/23 1501 01/25/23 1507 BP: 118/72  Pulse: 66  Temp: 36.8 C (98.3 F)  SpO2: 96%  Weight: 67.1 kg (148 lb)  Height: 158.8 cm (5' 2.5")  PainSc:  0-No pain    Exam Gen: NAD Abd: soft    Labs, Imaging and Diagnostic Testing: Colonoscopy report and pathology report reviewed.  Assessment and Plan: Diagnoses and all orders for this visit:  Adenomatous rectal polyp    57 year old female with a distal rectal polyp that has been incompletely resected.  I recommended anal exam under anesthesia.  We will remove the remaining polypoid tissue  with transanal excision.  We discussed risk which include bleeding, pain and recurrence.  All questions were answered.  Vanita Panda, MD Colon and Rectal Surgery [facility]

## 2023-02-04 ENCOUNTER — Telehealth: Payer: Self-pay | Admitting: *Deleted

## 2023-02-04 NOTE — Telephone Encounter (Signed)
   Pre-operative Risk Assessment    Patient Name: Teresa Henry  DOB: [birth year] MRN: [id number]     Request for Surgical Clearance    Procedure:   TRANS ANAL EXCISION OF RECTAL POLY  Date of Surgery:  Clearance TBD                                 Surgeon:  Romie Levee Surgeon's Group or Practice Name:  [facility]  Phone number:  [phone] LEAVE MESSAGES WITH TRIAGE  NURSE  Fax number:  [phone] [facility] LPN   Type of Clearance Requested:   - Medical    Type of Anesthesia:  MAC {  Signed, Oleta Mouse   02/04/2023, 8:05 AM

## 2023-02-04 NOTE — Telephone Encounter (Signed)
I s/w the pt and she has been scheduled for tele pre op appt 02/10/23 @ 9 am. Med rec and consent are done.     Patient Consent for Virtual Visit        Teresa Henry has provided verbal consent on 02/04/2023 for a virtual visit (video or telephone).   CONSENT FOR VIRTUAL VISIT FOR:  Teresa Henry  By participating in this virtual visit I agree to the following:  I hereby voluntarily request, consent and authorize [facility] and its employed or contracted physicians, physician assistants, nurse practitioners or other licensed health care professionals (the Practitioner), to provide me with telemedicine health care services (the "Services") as deemed necessary by the treating Practitioner. I acknowledge and consent to receive the Services by the Practitioner via telemedicine. I understand that the telemedicine visit will involve communicating with the Practitioner through live audiovisual communication technology and the disclosure of certain medical information by electronic transmission. I acknowledge that I have been given the opportunity to request an in-person assessment or other available alternative prior to the telemedicine visit and am voluntarily participating in the telemedicine visit.  I understand that I have the right to withhold or withdraw my consent to the use of telemedicine in the course of my care at any time, without affecting my right to future care or treatment, and that the Practitioner or I may terminate the telemedicine visit at any time. I understand that I have the right to inspect all information obtained and/or recorded in the course of the telemedicine visit and may receive copies of available information for a reasonable fee.  I understand that some of the potential risks of receiving the Services via telemedicine include:  Delay or interruption in medical evaluation due to technological equipment failure or disruption; Information transmitted may not  be sufficient (e.g. poor resolution of images) to allow for appropriate medical decision making by the Practitioner; and/or  In rare instances, security protocols could fail, causing a breach of personal health information.  Furthermore, I acknowledge that it is my responsibility to provide information about my medical history, conditions and care that is complete and accurate to the best of my ability. I acknowledge that Practitioner's advice, recommendations, and/or decision may be based on factors not within their control, such as incomplete or inaccurate data provided by me or distortions of diagnostic images or specimens that may result from electronic transmissions. I understand that the practice of medicine is not an exact science and that Practitioner makes no warranties or guarantees regarding treatment outcomes. I acknowledge that a copy of this consent can be made available to me via my patient portal [facility] MyChart), or I can request a printed copy by calling the office of [facility].    I understand that my insurance will be billed for this visit.   I have read or had this consent read to me. I understand the contents of this consent, which adequately explains the benefits and risks of the Services being provided via telemedicine.  I have been provided ample opportunity to ask questions regarding this consent and the Services and have had my questions answered to my satisfaction. I give my informed consent for the services to be provided through the use of telemedicine in my medical care

## 2023-02-04 NOTE — Telephone Encounter (Signed)
I s/w the pt and she has been scheduled for tele pre op appt 02/10/23 @ 9 am. Med rec and consent are done.

## 2023-02-04 NOTE — Telephone Encounter (Signed)
   Name: Teresa Henry  DOB: Mar 13, 1966  MRN: [id number]  Primary Cardiologist: Dr. Ladona Ridgel  Preoperative team, please contact this patient and set up a phone call appointment for further preoperative risk assessment. Please obtain consent and complete medication review. Thank you for your help.   Marcelino Duster, PA 02/04/2023, 5:08 PM [facility]

## 2023-02-09 NOTE — Progress Notes (Unsigned)
Virtual Visit via Telephone Note   Because of Teresa Henry co-morbid illnesses, she is at least at moderate risk for complications without adequate follow up.  This format is felt to be most appropriate for this patient at this time.  The patient did not have access to video technology/had technical difficulties with video requiring transitioning to audio format only (telephone).  All issues noted in this document were discussed and addressed.  No physical exam could be performed with this format.  Please refer to the patient's chart for her consent to telehealth for [facility].  Evaluation Performed:  Preoperative cardiovascular risk assessment _____________   Date:  02/09/2023   Patient ID:  Teresa Henry, DOB 06-15-1966, MRN [id number] Patient Location:  Home Provider location:   Office  Primary Care Provider:  Zola Button, Grayling Congress, DO Primary EP Cardiologist:  Dr. Lewayne Bunting  Chief Complaint / Patient Profile   57 y.o. y/o female with a h/o Vfib, ICD placement, and sinus node dysfunction who is pending trans anal excision of rectal polyp and presents today for telephonic preoperative cardiovascular risk assessment.  History of Present Illness    Teresa Henry is a 57 y.o. female who presents via audio/video conferencing for a telehealth visit today.  Pt was last seen in cardiology clinic on 11/05/22 by Dr. Ladona Ridgel.  At that time Teresa Henry was doing well. The patient is now pending procedure as outlined above. Since her last visit, she has remained stable from a cardiac perspective.   Today she denies chest pain, shortness of breath, lower extremity edema, fatigue, palpitations, melena, hematuria, hemoptysis, diaphoresis, weakness, presyncope, syncope, orthopnea, and PND.   Past Medical History    Past Medical History:  Diagnosis Date   Cardiac arrest [facility])    a. s/p MDT dual chamber ICD   Frequent episodic tension-type headache    Prolonged Q-T  interval on ECG    Past Surgical History:  Procedure Laterality Date   BREAST BIOPSY  07/27/1997   Needle Biopsy- Non cancer   CARDIAC CATHETERIZATION N/A 02/28/2015   Procedure: Left Heart Cath and Coronary Angiography;  Surgeon: Lyn Records, MD;  Location: [facility] INVASIVE CV LAB;  Service: Cardiovascular;  Laterality: N/A;   COLONOSCOPY  04/20/2018   EP IMPLANTABLE DEVICE N/A 02/28/2015   MDT dual chamber ICD implanted by Dr Johney Frame for secondary prevention   Allergies No Known Allergies Home Medications    Prior to Admission medications   Medication Sig Start Date End Date Taking? Authorizing Provider  Acetylcysteine (N-ACETYL CYSTEINE) 600 MG CAPS Take 1 capsule by mouth daily.    [provider]  aspirin 81 MG tablet Take 81 mg by mouth daily.    [provider]  b complex vitamins tablet Take 1 tablet by mouth daily.    [provider]  Boris Lown Oil 500 MG CAPS Take 350 mg by mouth.    [provider]  nadolol (CORGARD) 40 MG tablet TAKE 1 AND 1/2 TABLETS BY MOUTH TWICE A DAY. 01/15/23   Marinus Maw, MD  Probiotic Product (PROBIOTIC DAILY) CAPS Take 1 tablet by mouth daily.    [provider]  QUERCETIN PO Take 250 mg by mouth daily.    [provider]  vitamin C (ASCORBIC ACID) 500 MG tablet Take 1,000 mg by mouth daily.    [provider]  Vitamin D-Vitamin K (VITAMIN K2-VITAMIN D3 PO) Take 1 tablet by mouth daily. 50 mcg of  K2 125 mcg of D3    [provider]  Zinc 50 MG TABS Take 1 tablet by mouth daily.    [provider]   Physical Exam    Vital Signs:  Teresa Henry does not have vital signs available for review today.  Given telephonic nature of communication, physical exam is limited. AAOx3. NAD. Normal affect.  Speech and respirations are unlabored.  Accessory Clinical Findings    None  Assessment & Plan    1.  Preoperative Cardiovascular Risk Assessment:Trans anal excision of  rectal polyp with Dr. Romie Levee, [facility], fax [phone] Attn Michel Bickers LPN   Primary EP Cardiologist: Dr. Lewayne Bunting   Chart reviewed as part of pre-operative protocol coverage. Given past medical history and time since last visit, based on ACC/AHA guidelines, Teresa Henry would be at acceptable risk for the planned procedure without further cardiovascular testing.   Her RCRI is a class I risk with 3.9% risk of major cardiac event. She is able to accomplish greater than 4 METs of activity without anginal symptoms.   Patient was advised that if she develops new symptoms prior to surgery to contact our office to arrange a follow-up appointment.  She verbalized understanding.  I will route this recommendation to the requesting party via Epic fax function and remove from pre-op pool.  Time:   Today, I have spent greater than 5 minutes with the patient with telehealth technology discussing medical history, symptoms, and management plan.  I spent greater than 10 minutes prior to the telephone appointment reviewing her past medical history and cardiac medications.   Rip Harbour, NP  02/09/2023, 8:22 AM

## 2023-02-10 ENCOUNTER — Encounter: Payer: Self-pay | Admitting: Internal Medicine

## 2023-02-10 ENCOUNTER — Ambulatory Visit: Payer: BC Managed Care – PPO | Attending: Cardiology

## 2023-02-10 DIAGNOSIS — Z01810 Encounter for preprocedural cardiovascular examination: Secondary | ICD-10-CM

## 2023-02-10 NOTE — Progress Notes (Signed)
PERIOPERATIVE PRESCRIPTION FOR IMPLANTED CARDIAC DEVICE PROGRAMMING  Patient Information: Name:  Teresa Henry  DOB:  07-Sep-1965  MRN:  [id number]   Procedure:   TRANS ANAL EXCISION OF RECTAL POLY   Date of Surgery:  Clearance TBD                                 Surgeon:  Romie Levee Surgeon's Group or Practice Name:  [facility]  Phone number:  [phone] LEAVE MESSAGES WITH TRIAGE  NURSE  Fax number:  [phone] [facility] DOCKERY LPN   Type of Clearance Requested:   - Medical    Type of Anesthesia:  MAC  Device Information:  Clinic EP Physician:  Lewayne Bunting, MD   Device Type:  Defibrillator Manufacturer and Phone #:  Medtronic: [id number] Pacemaker Dependent?:  No. Date of Last Device Check:  12/25/2022 Normal Device Function?:  Yes.    Electrophysiologist's Recommendations:  Have magnet available. Provide continuous ECG monitoring when magnet is used or reprogramming is to be performed.  Procedure should not interfere with device function.  No device programming or magnet placement needed.  Per Device Clinic Standing Orders, Wiliam Ke, RN  9:10 AM 02/10/2023

## 2023-02-10 NOTE — Telephone Encounter (Signed)
Per pre op APP today to forward to device as well.    Per Reather Littler, PAC:  Looks like she has a device, can you please forward to device clinic as well for review. Thanks

## 2023-03-03 ENCOUNTER — Encounter: Payer: Self-pay | Admitting: Internal Medicine

## 2023-03-03 ENCOUNTER — Encounter (HOSPITAL_BASED_OUTPATIENT_CLINIC_OR_DEPARTMENT_OTHER): Payer: Self-pay | Admitting: General Surgery

## 2023-03-03 NOTE — Progress Notes (Signed)
PERIOPERATIVE PRESCRIPTION FOR IMPLANTED CARDIAC DEVICE PROGRAMMING  Patient Information: Name:  Teresa Henry  DOB:  13-Mar-1966  MRN:  [id number]    Planned Procedure:  anal exam under anesthesia, transanal excisional biopsy  Surgeon:  dr Romie Levee  Date of Procedure:  03-12-2023  Cautery will be used.  Position during surgery:  prone   Please send documentation back to:  [facility] (Fax # [phone])   Device Information:  Clinic EP Physician:  Lewayne Bunting, MD   Device Type:  Defibrillator Manufacturer and Phone #:  [employer]: [id number] Pacemaker Dependent?:  No. Date of Last Device Check:  12/25/2022 Normal Device Function?:  Yes.    Electrophysiologist's Recommendations:  Have magnet available. Provide continuous ECG monitoring when magnet is used or reprogramming is to be performed.  Procedure should not interfere with device function.  No device programming or magnet placement needed.  Per Device Clinic Standing Orders, Lenor Coffin, RN  3:00 PM 03/03/2023

## 2023-03-04 ENCOUNTER — Other Ambulatory Visit: Payer: Self-pay

## 2023-03-04 ENCOUNTER — Encounter (HOSPITAL_BASED_OUTPATIENT_CLINIC_OR_DEPARTMENT_OTHER): Payer: Self-pay | Admitting: General Surgery

## 2023-03-04 NOTE — Progress Notes (Signed)
Spoke w/ via phone for pre-op interview---pt Lab needs dos----I stat,             Lab results------EKG 11-05-2022 epic COVID test -----patient states asymptomatic no test needed Arrive at -------630 03-12-2023 NPO after MN NO Solid Food.  Clear liquids from MN until---530 am Med rec completed Medications to take morning of surgery -----Nadolol Diabetic medication -----n/a Patient instructed no nail polish to be worn day of surgery Patient instructed to bring photo id and insurance card day of surgery Patient aware to have Driver (ride ) / caregiver   husband Teresa Henry or daughter Teresa Henry  for 24 hours after surgery  Patient Special Instructions -----none Pre-Op special Instructions -----none Patient verbalized understanding of instructions that were given at this phone interview. Patient denies shortness of breath, chest pain, fever, cough at this phone interview.  Lov dr taylor 11-05-2022 epic Cardiac clearance note katlyn west np 02-09-2023 chart/epic Icd orders on chart Echo 02-27-2015 epic Patient to stay on 81 mg asa per dr Maisie Fus note, last dose to be 03-11-2023

## 2023-03-11 NOTE — Anesthesia Preprocedure Evaluation (Addendum)
Anesthesia Evaluation  Patient identified by MRN, date of birth, ID band Patient awake    Reviewed: Allergy & Precautions, NPO status , Patient's Chart, lab work & pertinent test results  History of Anesthesia Complications (+) PONV and history of anesthetic complications  Airway Mallampati: I  TM Distance: >3 FB Neck ROM: Full    Dental  (+) Dental Advisory Given   Pulmonary neg pulmonary ROS   Pulmonary exam normal breath sounds clear to auscultation       Cardiovascular (-) hypertension(-) angina (-) Past MI, (-) Cardiac Stents and (-) CABG + dysrhythmias (prolonged QT) + Cardiac Defibrillator + Valvular Problems/Murmurs (MVP as a teenager)  Rhythm:Regular Rate:Normal  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 No regional wall motion abnormality noted in RAO projection. EF estimated to be 55%.   TTE 02/27/2015: Study Conclusions   - Left ventricle: The cavity size was normal. There was moderate    concentric hypertrophy. Systolic function was vigorous. The    estimated ejection fraction was in the range of 65% to 70%. Wall    motion was normal; there were no regional wall motion    abnormalities. Doppler parameters are consistent with abnormal    left ventricular relaxation (grade 1 diastolic dysfunction).    There was no evidence of elevated ventricular filling pressure by    Doppler parameters.  - Aortic valve: Trileaflet; normal thickness leaflets. There was no    regurgitation.  - Aortic root: The aortic root was normal in size.  - Mitral valve: Structurally normal valve. There was no    regurgitation.  - Left atrium: The atrium was normal in size.  - Right ventricle: Systolic function was normal.  - Right atrium: The atrium was normal in size.  - Tricuspid valve: There was trivial regurgitation.  - Pulmonic valve: There was no  regurgitation.  - Pulmonary arteries: Systolic pressure was within the normal    range.  - Inferior vena cava: The vessel was normal in size.  - Pericardium, extracardiac: There was no pericardial effusion.      Neuro/Psych  Headaches, neg Seizures    GI/Hepatic negative GI ROS, Neg liver ROS,,,  Endo/Other  negative endocrine ROS    Renal/GU negative Renal ROS     Musculoskeletal   Abdominal   Peds  Hematology negative hematology ROS (+)   Anesthesia Other Findings Electrophysiologist's Recommendations:    Have magnet available.  Provide continuous ECG monitoring when magnet is used or reprogramming is to be performed.   Procedure should not interfere with device function.  No device programming or magnet placement needed.   Reproductive/Obstetrics                             Anesthesia Physical Anesthesia Plan  ASA: 3  Anesthesia Plan: MAC   Post-op Pain Management:    Induction: Intravenous  PONV Risk Score and Plan: 3 and Ondansetron, Dexamethasone, Treatment may vary due to age or medical condition and Propofol infusion  Airway Management Planned: Natural Airway and Simple Face Mask  Additional Equipment:   Intra-op Plan:   Post-operative Plan:   Informed Consent: I have reviewed the patients History and Physical, chart, labs and discussed the procedure including the risks, benefits and alternatives for the proposed anesthesia with the patient or authorized representative who has indicated his/her understanding and acceptance.  Dental advisory given  Plan Discussed with: CRNA and Anesthesiologist  Anesthesia Plan Comments: (Risks of general anesthesia discussed including, but not limited to, sore throat, hoarse voice, chipped/damaged teeth, injury to vocal cords, nausea and vomiting, allergic reactions, lung infection, heart attack, stroke, and death. All questions answered. )       Anesthesia Quick  Evaluation

## 2023-03-12 ENCOUNTER — Encounter (HOSPITAL_BASED_OUTPATIENT_CLINIC_OR_DEPARTMENT_OTHER): Payer: Self-pay | Admitting: General Surgery

## 2023-03-12 ENCOUNTER — Other Ambulatory Visit: Payer: Self-pay

## 2023-03-12 ENCOUNTER — Ambulatory Visit (HOSPITAL_BASED_OUTPATIENT_CLINIC_OR_DEPARTMENT_OTHER): Payer: BC Managed Care – PPO | Admitting: Anesthesiology

## 2023-03-12 ENCOUNTER — Encounter (HOSPITAL_BASED_OUTPATIENT_CLINIC_OR_DEPARTMENT_OTHER)
Admission: RE | Disposition: A | Discharge status: Home / Self Care | Payer: Self-pay | Source: Home / Self Care | Attending: General Surgery

## 2023-03-12 ENCOUNTER — Ambulatory Visit (HOSPITAL_BASED_OUTPATIENT_CLINIC_OR_DEPARTMENT_OTHER)
Admission: RE | Admit: 2023-03-12 | Discharge: 2023-03-12 | Disposition: A | Discharge status: Home / Self Care | Payer: BC Managed Care – PPO | Attending: General Surgery | Admitting: General Surgery

## 2023-03-12 DIAGNOSIS — Z8674 Personal history of sudden cardiac arrest: Secondary | ICD-10-CM | POA: Diagnosis not present

## 2023-03-12 DIAGNOSIS — D128 Benign neoplasm of rectum: Secondary | ICD-10-CM | POA: Diagnosis not present

## 2023-03-12 DIAGNOSIS — Z8249 Family history of ischemic heart disease and other diseases of the circulatory system: Secondary | ICD-10-CM | POA: Insufficient documentation

## 2023-03-12 DIAGNOSIS — Z01818 Encounter for other preprocedural examination: Secondary | ICD-10-CM

## 2023-03-12 DIAGNOSIS — I469 Cardiac arrest, cause unspecified: Secondary | ICD-10-CM | POA: Diagnosis not present

## 2023-03-12 DIAGNOSIS — K621 Rectal polyp: Secondary | ICD-10-CM | POA: Diagnosis not present

## 2023-03-12 HISTORY — DX: Nausea with vomiting, unspecified: R11.2

## 2023-03-12 HISTORY — DX: Cardiac murmur, unspecified: R01.1

## 2023-03-12 HISTORY — PX: TRANSANAL EXCISION OF RECTAL MASS: SHX6134

## 2023-03-12 HISTORY — PX: RECTAL EXAM UNDER ANESTHESIA: SHX6399

## 2023-03-12 HISTORY — DX: Rectal polyp: K62.1

## 2023-03-12 HISTORY — DX: Other specified postprocedural states: Z98.890

## 2023-03-12 LAB — POCT I-STAT, CHEM 8
BUN: 15 mg/dL (ref 6–20)
Calcium, Ion: 1.27 mmol/L (ref 1.15–1.40)
Chloride: 99 mmol/L (ref 98–111)
Creatinine, Ser: 0.8 mg/dL (ref 0.44–1.00)
Glucose, Bld: 98 mg/dL (ref 70–99)
HCT: 45 % (ref 36.0–46.0)
Hemoglobin: 15.3 g/dL — ABNORMAL HIGH (ref 12.0–15.0)
Potassium: 4.2 mmol/L (ref 3.5–5.1)
Sodium: 139 mmol/L (ref 135–145)
TCO2: 28 mmol/L (ref 22–32)

## 2023-03-12 SURGERY — EXCISION, MASS, RECTUM, ANAL APPROACH
Anesthesia: Monitor Anesthesia Care

## 2023-03-12 MED ORDER — ONABOTULINUMTOXINA 100 UNITS IJ SOLR
INTRAMUSCULAR | Status: AC
  Filled 2023-03-12: qty 100

## 2023-03-12 MED ORDER — LACTATED RINGERS IV SOLN: INTRAVENOUS | Status: DC

## 2023-03-12 MED ORDER — 0.9 % SODIUM CHLORIDE (POUR BTL) OPTIME
TOPICAL | Status: DC | PRN
  Administered 2023-03-12: 1000 mL

## 2023-03-12 MED ORDER — LIDOCAINE HCL (PF) 2 % IJ SOLN
INTRAMUSCULAR | Status: AC
  Filled 2023-03-12: qty 5

## 2023-03-12 MED ORDER — FENTANYL CITRATE (PF) 100 MCG/2ML IJ SOLN
INTRAMUSCULAR | Status: DC | PRN
  Administered 2023-03-12: 50 ug via INTRAVENOUS

## 2023-03-12 MED ORDER — FENTANYL CITRATE (PF) 100 MCG/2ML IJ SOLN
INTRAMUSCULAR | Status: AC
  Filled 2023-03-12: qty 2

## 2023-03-12 MED ORDER — OXYCODONE HCL 5 MG PO TABS: 5.0000 mg | ORAL_TABLET | Freq: Once | ORAL | Status: DC | PRN

## 2023-03-12 MED ORDER — AMISULPRIDE (ANTIEMETIC) 5 MG/2ML IV SOLN: 10.0000 mg | Freq: Once | INTRAVENOUS | Status: DC | PRN

## 2023-03-12 MED ORDER — ACETIC ACID 5 % SOLN
Status: AC
  Filled 2023-03-12: qty 50

## 2023-03-12 MED ORDER — PROPOFOL 500 MG/50ML IV EMUL
INTRAVENOUS | Status: DC | PRN
  Administered 2023-03-12: 30 mg via INTRAVENOUS
  Administered 2023-03-12: 200 ug/kg/min via INTRAVENOUS

## 2023-03-12 MED ORDER — TRAMADOL HCL 50 MG PO TABS: 50.0000 mg | ORAL_TABLET | Freq: Four times a day (QID) | ORAL | 0 refills | Status: DC | PRN

## 2023-03-12 MED ORDER — BUPIVACAINE LIPOSOME 1.3 % IJ SUSP
INTRAMUSCULAR | Status: AC
  Filled 2023-03-12: qty 20

## 2023-03-12 MED ORDER — ACETAMINOPHEN 500 MG PO TABS
1000.0000 mg | ORAL_TABLET | ORAL | Status: AC
  Administered 2023-03-12: 1000 mg via ORAL

## 2023-03-12 MED ORDER — OXYCODONE HCL 5 MG/5ML PO SOLN: 5.0000 mg | Freq: Once | ORAL | Status: DC | PRN

## 2023-03-12 MED ORDER — PROPOFOL 500 MG/50ML IV EMUL
INTRAVENOUS | Status: AC
  Filled 2023-03-12: qty 50

## 2023-03-12 MED ORDER — BUPIVACAINE-EPINEPHRINE 0.5% -1:200000 IJ SOLN
INTRAMUSCULAR | Status: DC | PRN
  Administered 2023-03-12: 2 mL

## 2023-03-12 MED ORDER — FENTANYL CITRATE (PF) 100 MCG/2ML IJ SOLN: 25.0000 ug | INTRAMUSCULAR | Status: DC | PRN

## 2023-03-12 MED ORDER — PROPOFOL 500 MG/50ML IV EMUL: INTRAVENOUS | Status: DC | PRN

## 2023-03-12 MED ORDER — BUPIVACAINE LIPOSOME 1.3 % IJ SUSP
INTRAMUSCULAR | Status: DC | PRN
  Administered 2023-03-12: 20 mL

## 2023-03-12 MED ORDER — ACETAMINOPHEN 500 MG PO TABS
ORAL_TABLET | ORAL | Status: AC
  Filled 2023-03-12: qty 2

## 2023-03-12 MED ORDER — MIDAZOLAM HCL 2 MG/2ML IJ SOLN
INTRAMUSCULAR | Status: AC
  Filled 2023-03-12: qty 2

## 2023-03-12 MED ORDER — MIDAZOLAM HCL 2 MG/2ML IJ SOLN
INTRAMUSCULAR | Status: DC | PRN
  Administered 2023-03-12: 1 mg via INTRAVENOUS

## 2023-03-12 MED ORDER — SODIUM CHLORIDE 0.9% FLUSH: 3.0000 mL | Freq: Two times a day (BID) | INTRAVENOUS | Status: DC

## 2023-03-12 SURGICAL SUPPLY — 56 items
APL SKNCLS STERI-STRIP NONHPOA (GAUZE/BANDAGES/DRESSINGS) ×2
BENZOIN TINCTURE PRP APPL 2/3 (GAUZE/BANDAGES/DRESSINGS) ×4 IMPLANT
BLADE EXTENDED COATED 6.5IN (ELECTRODE) IMPLANT
BLADE SURG 10 STRL SS (BLADE) ×2 IMPLANT
BLADE SURG 15 STRL LF DISP TIS (BLADE) IMPLANT
BLADE SURG 15 STRL SS (BLADE) ×1
BRIEF MESH DISP LRG (UNDERPADS AND DIAPERS) ×2 IMPLANT
COVER BACK TABLE 60X90IN (DRAPES) ×2 IMPLANT
COVER MAYO STAND STRL (DRAPES) ×2 IMPLANT
DRAPE HYSTEROSCOPY (MISCELLANEOUS) IMPLANT
DRAPE LAPAROTOMY 100X72 PEDS (DRAPES) ×2 IMPLANT
DRAPE SHEET LG 3/4 BI-LAMINATE (DRAPES) IMPLANT
DRAPE UTILITY XL STRL (DRAPES) ×2 IMPLANT
ELECT REM PT RETURN 9FT ADLT (ELECTROSURGICAL) ×1
ELECTRODE REM PT RTRN 9FT ADLT (ELECTROSURGICAL) ×2 IMPLANT
GAUZE 4X4 16PLY ~~LOC~~+RFID DBL (SPONGE) ×2 IMPLANT
GAUZE PAD ABD 8X10 STRL (GAUZE/BANDAGES/DRESSINGS) ×2 IMPLANT
GAUZE SPONGE 4X4 12PLY STRL (GAUZE/BANDAGES/DRESSINGS) ×2 IMPLANT
GLOVE BIO SURGEON STRL SZ 6.5 (GLOVE) ×2 IMPLANT
GLOVE INDICATOR 6.5 STRL GRN (GLOVE) ×2 IMPLANT
GOWN STRL REUS W/TWL XL LVL3 (GOWN DISPOSABLE) ×2 IMPLANT
HYDROGEN PEROXIDE 16OZ (MISCELLANEOUS) ×2 IMPLANT
IV CATH 14GX2 1/4 (CATHETERS) ×2 IMPLANT
IV CATH 18G SAFETY (IV SOLUTION) ×2 IMPLANT
KIT SIGMOIDOSCOPE (SET/KITS/TRAYS/PACK) IMPLANT
KIT TURNOVER CYSTO (KITS) ×2 IMPLANT
LEGGING LITHOTOMY PAIR STRL (DRAPES) IMPLANT
LOOP VASCLR MAXI BLUE 18IN ST (MISCELLANEOUS) IMPLANT
LOOP VASCULAR MAXI 18 BLUE (MISCELLANEOUS)
NDL HYPO 22X1.5 SAFETY MO (MISCELLANEOUS) ×2 IMPLANT
NDL SAFETY ECLIP 18X1.5 (MISCELLANEOUS) IMPLANT
NEEDLE HYPO 22X1.5 SAFETY MO (MISCELLANEOUS) ×1
NS IRRIG 500ML POUR BTL (IV SOLUTION) ×2 IMPLANT
PACK BASIN DAY SURGERY FS (CUSTOM PROCEDURE TRAY) ×2 IMPLANT
PAD ARMBOARD 7.5X6 YLW CONV (MISCELLANEOUS) IMPLANT
PENCIL SMOKE EVACUATOR (MISCELLANEOUS) ×2 IMPLANT
SLEEVE SCD COMPRESS KNEE MED (STOCKING) ×2 IMPLANT
SPIKE FLUID TRANSFER (MISCELLANEOUS) ×2 IMPLANT
SPONGE HEMORRHOID 8X3CM (HEMOSTASIS) IMPLANT
SPONGE SURGIFOAM ABS GEL 12-7 (HEMOSTASIS) IMPLANT
SUCTION TUBE FRAZIER 10FR DISP (SUCTIONS) IMPLANT
SUT CHROMIC 2 0 SH (SUTURE) IMPLANT
SUT CHROMIC 3 0 SH 27 (SUTURE) IMPLANT
SUT ETHIBOND 0 (SUTURE) IMPLANT
SUT VIC AB 2-0 SH 27 (SUTURE)
SUT VIC AB 2-0 SH 27XBRD (SUTURE) IMPLANT
SUT VIC AB 3-0 SH 18 (SUTURE) IMPLANT
SUT VIC AB 3-0 SH 27 (SUTURE)
SUT VIC AB 3-0 SH 27X BRD (SUTURE) IMPLANT
SUT VIC AB 3-0 SH 27XBRD (SUTURE) IMPLANT
SYR CONTROL 10ML LL (SYRINGE) ×2 IMPLANT
TOWEL OR 17X24 6PK STRL BLUE (TOWEL DISPOSABLE) ×2 IMPLANT
TRAY DSU PREP LF (CUSTOM PROCEDURE TRAY) ×2 IMPLANT
TUBE CONNECTING 12X1/4 (SUCTIONS) ×2 IMPLANT
VASCULAR TIE MAXI BLUE 18IN ST (MISCELLANEOUS)
YANKAUER SUCT BULB TIP NO VENT (SUCTIONS) ×2 IMPLANT

## 2023-03-12 NOTE — Anesthesia Postprocedure Evaluation (Signed)
Anesthesia Post Note  Patient: Teresa Henry  Procedure(s) Performed: TRANSANAL EXCISION OF RECTAL POLYP RECTAL EXAM UNDER ANESTHESIA     Patient location during evaluation: PACU Anesthesia Type: MAC Level of consciousness: awake Pain management: pain level controlled Vital Signs Assessment: post-procedure vital signs reviewed and stable Respiratory status: spontaneous breathing, nonlabored ventilation and respiratory function stable Cardiovascular status: stable and blood pressure returned to baseline Postop Assessment: no apparent nausea or vomiting Anesthetic complications: no   No notable events documented.  Last Vitals:  Vitals:   03/12/23 1010 03/12/23 1016  BP:  125/81  Pulse: 60 (!) 58  Resp: 15 13  Temp:    SpO2: 99% 100%    Last Pain:  Vitals:   03/12/23 1016  TempSrc:   PainSc: 0-No pain                 Linton Rump

## 2023-03-12 NOTE — Discharge Instr - Supplementary Instructions (Signed)
May take Tylenol after 1pm if needed for discomfort.

## 2023-03-12 NOTE — H&P (Signed)
REFERRING PHYSICIAN:  Ruffin Frederick*   PROVIDER:  Elenora Gamma, MD   MRN: [id number] DOB: 04-20-1966    Subjective      History of Present Illness: Teresa Henry is a 57 y.o. female who is seen today as an office consultation at the request of Dr. Adela Lank for evaluation of No chief complaint on file. .  Patient per since to the office to discuss transanal excision.  She underwent a colonoscopy on 12/08/2022 and a small polypoid lesion was noted in her rectum which extended down into the dentate line.  Dr. Adela Lank was able to remove a portion of this but was not able to get everything out.  Pathology showed tubular adenoma     Review of Systems: A complete review of systems was obtained from the patient.  I have reviewed this information and discussed as appropriate with the patient.  See HPI as well for other ROS.       Medical History: Past Medical History: DiagnosisDate            Arrhythmia                   Hyperlipidemia                 There is no problem list on file for this patient.     Past Surgical History: ProcedureLateralityDate            MASTECTOMY PARTIAL / LUMPECTOMY             1999     No Known Allergies   Current Outpatient Medications on File Prior to Visit MedicationSigDispenseRefill            nadoloL (CORGARD) 40 MG tablet   Take 1.5 tablets by mouth 2 (two) times daily                            acetylcysteine 600 mg capsule          Take 1 capsule by mouth once daily                          ascorbic acid, vitamin C, (VITAMIN C) 500 MG tablet           Take 1,000 mg by mouth once daily                                aspirin 81 MG EC tablet         Take 81 mg by mouth once daily                               b complex vitamins tablet       Take 1 tablet by mouth once daily                              calcium-vitamin D3-vitamin K (CALCIUM FOR WOMEN) 500-100-40 mg-unit-mcg Chew     Take by mouth                                     krill oil 500 mg Cap  Take by mouth                                    quercetin dihydrate, bulk, 100 % Powd          Take 250 mg by mouth once daily                             zinc gluconate 50 mg tablet    Take 1 tablet by mouth once daily                     No current facility-administered medications on file prior to visit.     Family History ProblemRelationAge of Onset            Stroke  Mother             High blood pressure (Hypertension)   Mother             Hyperlipidemia (Elevated cholesterol)            Mother             Skin cancer     Father              High blood pressure (Hypertension)   Father              Hyperlipidemia (Elevated cholesterol)            Father              High blood pressure (Hypertension)   Brother                        Coronary Artery Disease (Blocked arteries around heart)     Brother                 Social History   Tobacco Use Smoking StatusNever Smokeless TobaccoNever     Social History   Socioeconomic History Marital status:Married Tobacco Use Smoking status:Never Smokeless tobacco:Never Vaping Use Vaping status:Never Used Substance and Sexual Activity Alcohol WUJ:WJXBJ Drug YNW:GNFAO     Objective:   Vitals:   03/12/23 0659  BP: 129/81  Pulse: 62  Resp: 17  Temp: 98.1 F (36.7 C)  SpO2: 97%      Exam Gen: NAD Abd: soft       Labs, Imaging and Diagnostic Testing: Colonoscopy report and pathology report reviewed.   Assessment and Plan: Diagnoses and all orders for this visit:   Adenomatous rectal polyp     57 year old female with a distal rectal polyp that has been incompletely resected.  I recommended anal exam under anesthesia.  We will remove the remaining polypoid tissue with transanal excision.  We discussed risk which include bleeding, pain and recurrence.  All questions were answered.   Vanita Panda, MD Colon and Rectal Surgery [facility]

## 2023-03-12 NOTE — Discharge Instructions (Addendum)
Beginning the day after surgery:  You may sit in a tub of warm water 2-3 times a day to relieve discomfort.  Eat a regular diet high in fiber.  Avoid foods that give you constipation or diarrhea.  Avoid foods that are difficult to digest, such as seeds, nuts, corn or popcorn.  Do not go any longer than 2 days without a bowel movement.  You may take a dose of Milk of Magnesia if you become constipated.    Drink 6-8 glasses of water daily.  Walking is encouraged.  Avoid strenuous activity and heavy lifting for one month after surgery.    Call the office if you have any questions or concerns.  Call immediately if you develop:  Excessive rectal bleeding (more than a cup or passing large clots) Increased discomfort Fever greater than 100 F Difficulty urinating  Post Anesthesia Home Care Instructions  Activity: Get plenty of rest for the remainder of the day. A responsible adult should stay with you for 24 hours following the procedure.  For the next 24 hours, DO NOT: -Drive a car -Paediatric nurse -Drink alcoholic beverages -Take any medication unless instructed by your physician -Make any legal decisions or sign important papers.  Meals: Start with liquid foods such as gelatin or soup. Progress to regular foods as tolerated. Avoid greasy, spicy, heavy foods. If nausea and/or vomiting occur, drink only clear liquids until the nausea and/or vomiting subsides. Call your physician if vomiting continues.  Special Instructions/Symptoms: Your throat may feel dry or sore from the anesthesia or the breathing tube placed in your throat during surgery. If this causes discomfort, gargle with warm salt water. The discomfort should disappear within 24 hours.     Information for Discharge Teaching: EXPAREL (bupivacaine liposome injectable suspension)   Your surgeon gave you EXPAREL(bupivacaine) in your surgical incision to help control your pain after surgery.  EXPAREL is a local anesthetic  that provides pain relief by numbing the tissue around the surgical site. EXPAREL is designed to release pain medication over time and can control pain for up to 72 hours. Depending on how you respond to EXPAREL, you may require less pain medication during your recovery.  Possible side effects: Temporary loss of sensation or ability to move in the area where bupivacaine was injected. Nausea, vomiting, constipation Rarely, numbness and tingling in your mouth or lips, lightheadedness, or anxiety may occur. Call your doctor right away if you think you may be experiencing any of these sensations, or if you have other questions regarding possible side effects.  Follow all other discharge instructions given to you by your surgeon or nurse. Eat a healthy diet and drink plenty of water or other fluids.  If you return to the hospital for any reason within 96 hours following the administration of EXPAREL, please inform your health care providers.

## 2023-03-12 NOTE — Op Note (Signed)
03/12/2023  9:19 AM  PATIENT:  Teresa Henry  58 y.o. female  Patient Care Team: Zola Button, Grayling Congress, DO as PCP - General (Family Medicine) Marinus Maw, MD as Consulting Physician (Cardiology) Olegario Shearer, MD as Referring Physician (Dermatology)  PRE-OPERATIVE DIAGNOSIS:  rectal polyp  POST-OPERATIVE DIAGNOSIS:  R lateral rectal polyp  PROCEDURE:  TRANSANAL EXCISION OF RECTAL POLYP RECTAL EXAM UNDER ANESTHESIA   Surgeon(s): Romie Levee, MD  ASSISTANT: none   ANESTHESIA:   local and MAC  SPECIMEN:  Source of Specimen:  distal rectal polyp  DISPOSITION OF SPECIMEN:  PATHOLOGY  COUNTS:  YES  PLAN OF CARE: Discharge to home after PACU  PATIENT DISPOSITION:  PACU - hemodynamically stable.  INDICATION: endoscopically unresectable adenomatous rectal polyp   OR FINDINGS: R lateral polyp extending across dentate line into the anal cana  DESCRIPTION: the patient was identified in the preoperative holding area and taken to the OR where they were laid on the operating room table.  MAC anesthesia was induced without difficulty. The patient was then positioned in prone jackknife position with buttocks gently taped apart.  The patient was then prepped and draped in usual sterile fashion.  SCDs were noted to be in place prior to the initiation of anesthesia. A surgical timeout was performed indicating the correct patient, procedure, positioning and need for preoperative antibiotics.  A rectal block was performed using Marcaine with epinephrine mixed with Experel.    I began with a digital rectal exam.  No lesions were palpated.  I then placed a Hill-Ferguson anoscope into the anal canal and evaluated this completely.  I identified an area at the right lateral anal canal that appeared to have scar from previous biopsy at the dentate line.  There was a faint raised tissue extending beyond this into the anal canal.  This was presumed to be the residual polyp.  The edges of  this raised tissue were marked with cautery.  I then excised the entire area using electrocautery to obtain hemostasis as we went.  The underlying muscle inspector complex was preserved.  This was then sent to pathology for further examination.  The edges of the resection site were closed using interrupted 3-0 chromic suture.  Additional Exparel was placed around the incision for postoperative pain control.  Patient was then awakened from anesthesia and sent to the postanesthesia care unit in stable condition.  All counts were correct per operating room staff.  Vanita Panda, MD  Colorectal and General Surgery [facility]

## 2023-03-12 NOTE — Anesthesia Procedure Notes (Signed)
Procedure Name: MAC Date/Time: 03/12/2023 8:49 AM  Performed by: Francie Massing, CRNAPre-anesthesia Checklist: Emergency Drugs available, Patient identified, Patient being monitored, Suction available and Timeout performed Patient Re-evaluated:Patient Re-evaluated prior to induction Oxygen Delivery Method: Simple face mask

## 2023-03-12 NOTE — Transfer of Care (Signed)
Immediate Anesthesia Transfer of Care Note  Patient: Teresa Henry  Procedure(s) Performed: Procedure(s) (LRB): TRANSANAL EXCISION OF RECTAL POLYP (N/A) RECTAL EXAM UNDER ANESTHESIA (N/A)  Patient Location: PACU  Anesthesia Type: MAC  Level of Consciousness: awake, alert , oriented and patient cooperative  Airway & Oxygen Therapy: Patient Spontanous Breathing rom air  Post-op Assessment: Report given to PACU RN and Post -op Vital signs reviewed and stable  Post vital signs: Reviewed and stable  Complications: No apparent anesthesia complications Last Vitals:  Vitals Value Taken Time  BP 128/78 03/12/23 0925  Temp    Pulse 68 03/12/23 0928  Resp 18 03/12/23 0928  SpO2 94 % 03/12/23 0928  Vitals shown include unfiled device data.  Last Pain:  Vitals:   03/12/23 0659  TempSrc: Oral  PainSc: 0-No pain      Patients Stated Pain Goal: 7 (03/12/23 0659)  Complications: No notable events documented.

## 2023-03-15 ENCOUNTER — Encounter (HOSPITAL_BASED_OUTPATIENT_CLINIC_OR_DEPARTMENT_OTHER): Payer: Self-pay | Admitting: General Surgery

## 2023-03-15 LAB — SURGICAL PATHOLOGY

## 2023-03-26 ENCOUNTER — Ambulatory Visit (INDEPENDENT_AMBULATORY_CARE_PROVIDER_SITE_OTHER): Payer: BC Managed Care – PPO

## 2023-03-26 DIAGNOSIS — I495 Sick sinus syndrome: Secondary | ICD-10-CM

## 2023-03-26 LAB — CUP PACEART REMOTE DEVICE CHECK
Battery Remaining Longevity: 13 mo
Battery Voltage: 2.88 V
Brady Statistic AP VP Percent: 0.05 %
Brady Statistic AP VS Percent: 64.12 %
Brady Statistic AS VP Percent: 0.01 %
Brady Statistic AS VS Percent: 35.82 %
Brady Statistic RA Percent Paced: 64.1 %
Brady Statistic RV Percent Paced: 0.06 %
Date Time Interrogation Session: 20240830022704
HighPow Impedance: 72 Ohm
Implantable Lead Connection Status: 753985
Implantable Lead Connection Status: 753985
Implantable Lead Implant Date: 20160804
Implantable Lead Implant Date: 20160804
Implantable Lead Location: 753859
Implantable Lead Location: 753860
Implantable Lead Model: 5076
Implantable Pulse Generator Implant Date: 20160804
Lead Channel Impedance Value: 456 Ohm
Lead Channel Impedance Value: 513 Ohm
Lead Channel Impedance Value: 589 Ohm
Lead Channel Pacing Threshold Amplitude: 0.625 V
Lead Channel Pacing Threshold Amplitude: 0.625 V
Lead Channel Pacing Threshold Pulse Width: 0.4 ms
Lead Channel Pacing Threshold Pulse Width: 0.4 ms
Lead Channel Sensing Intrinsic Amplitude: 1.375 mV
Lead Channel Sensing Intrinsic Amplitude: 1.375 mV
Lead Channel Sensing Intrinsic Amplitude: 8.125 mV
Lead Channel Sensing Intrinsic Amplitude: 8.125 mV
Lead Channel Setting Pacing Amplitude: 1.5 V
Lead Channel Setting Pacing Amplitude: 2 V
Lead Channel Setting Pacing Pulse Width: 0.4 ms
Lead Channel Setting Sensing Sensitivity: 0.3 mV
Zone Setting Status: 755011
Zone Setting Status: 755011

## 2023-03-30 NOTE — Progress Notes (Signed)
Remote ICD transmission.   

## 2023-05-11 DIAGNOSIS — Z01419 Encounter for gynecological examination (general) (routine) without abnormal findings: Secondary | ICD-10-CM | POA: Diagnosis not present

## 2023-05-11 DIAGNOSIS — Z1231 Encounter for screening mammogram for malignant neoplasm of breast: Secondary | ICD-10-CM | POA: Diagnosis not present

## 2023-05-21 ENCOUNTER — Encounter: Payer: Self-pay | Admitting: Family Medicine

## 2023-05-21 ENCOUNTER — Ambulatory Visit (INDEPENDENT_AMBULATORY_CARE_PROVIDER_SITE_OTHER): Payer: BC Managed Care – PPO | Admitting: Family Medicine

## 2023-05-21 VITALS — BP 108/78 | HR 62 | Temp 97.6°F | Resp 16 | Ht 62.0 in | Wt 146.0 lb

## 2023-05-21 DIAGNOSIS — Z0000 Encounter for general adult medical examination without abnormal findings: Secondary | ICD-10-CM

## 2023-05-21 DIAGNOSIS — Z13220 Encounter for screening for lipoid disorders: Secondary | ICD-10-CM

## 2023-05-21 DIAGNOSIS — E559 Vitamin D deficiency, unspecified: Secondary | ICD-10-CM | POA: Diagnosis not present

## 2023-05-21 LAB — LIPID PANEL
Cholesterol: 197 mg/dL (ref 0–200)
HDL: 45 mg/dL (ref >39.00)
LDL Cholesterol: 129 mg/dL — ABNORMAL HIGH (ref 0–99)
NonHDL: 152.18
Total CHOL/HDL Ratio: 4
Triglycerides: 115 mg/dL (ref 0.0–149.0)
VLDL: 23 mg/dL (ref 0.0–40.0)

## 2023-05-21 LAB — COMPREHENSIVE METABOLIC PANEL
ALT: 36 U/L — ABNORMAL HIGH (ref 0–35)
AST: 25 U/L (ref 0–37)
Albumin: 4.6 g/dL (ref 3.5–5.2)
Alkaline Phosphatase: 122 U/L — ABNORMAL HIGH (ref 39–117)
BUN: 15 mg/dL (ref 6–23)
CO2: 30 meq/L (ref 19–32)
Calcium: 10.4 mg/dL (ref 8.4–10.5)
Chloride: 100 meq/L (ref 96–112)
Creatinine, Ser: 0.73 mg/dL (ref 0.40–1.20)
GFR: 91.47 mL/min (ref >60.00)
Glucose, Bld: 96 mg/dL (ref 70–99)
Potassium: 4.7 meq/L (ref 3.5–5.1)
Sodium: 139 meq/L (ref 135–145)
Total Bilirubin: 0.6 mg/dL (ref 0.2–1.2)
Total Protein: 7 g/dL (ref 6.0–8.3)

## 2023-05-21 LAB — CBC WITH DIFFERENTIAL/PLATELET
Basophils Absolute: 0.1 10*3/uL (ref 0.0–0.1)
Basophils Relative: 1 % (ref 0.0–3.0)
Eosinophils Absolute: 0.2 10*3/uL (ref 0.0–0.7)
Eosinophils Relative: 3 % (ref 0.0–5.0)
HCT: 44.5 % (ref 36.0–46.0)
Hemoglobin: 14.5 g/dL (ref 12.0–15.0)
Lymphocytes Relative: 41 % (ref 12.0–46.0)
Lymphs Abs: 2.8 10*3/uL (ref 0.7–4.0)
MCHC: 32.5 g/dL (ref 30.0–36.0)
MCV: 85.1 fL (ref 78.0–100.0)
Monocytes Absolute: 0.6 10*3/uL (ref 0.1–1.0)
Monocytes Relative: 8.2 % (ref 3.0–12.0)
Neutro Abs: 3.2 10*3/uL (ref 1.4–7.7)
Neutrophils Relative %: 46.8 % (ref 43.0–77.0)
Platelets: 250 10*3/uL (ref 150.0–400.0)
RBC: 5.23 Mil/uL — ABNORMAL HIGH (ref 3.87–5.11)
RDW: 13.6 % (ref 11.5–15.5)
WBC: 6.9 10*3/uL (ref 4.0–10.5)

## 2023-05-21 LAB — VITAMIN D 25 HYDROXY (VIT D DEFICIENCY, FRACTURES): VITD: 71.29 ng/mL (ref 30.00–100.00)

## 2023-05-21 LAB — TSH: TSH: 1.16 u[IU]/mL (ref 0.35–5.50)

## 2023-05-21 NOTE — Patient Instructions (Signed)
Preventive Care 40-57 Years Old, Female Preventive care refers to lifestyle choices and visits with your health care provider that can promote health and wellness. Preventive care visits are also called wellness exams. What can I expect for my preventive care visit? Counseling Your health care provider may ask you questions about your: Medical history, including: Past medical problems. Family medical history. Pregnancy history. Current health, including: Menstrual cycle. Method of birth control. Emotional well-being. Home life and relationship well-being. Sexual activity and sexual health. Lifestyle, including: Alcohol, nicotine or tobacco, and drug use. Access to firearms. Diet, exercise, and sleep habits. Work and work environment. Sunscreen use. Safety issues such as seatbelt and bike helmet use. Physical exam Your health care provider will check your: Height and weight. These may be used to calculate your BMI (body mass index). BMI is a measurement that tells if you are at a healthy weight. Waist circumference. This measures the distance around your waistline. This measurement also tells if you are at a healthy weight and may help predict your risk of certain diseases, such as type 2 diabetes and high blood pressure. Heart rate and blood pressure. Body temperature. Skin for abnormal spots. What immunizations do I need?  Vaccines are usually given at various ages, according to a schedule. Your health care provider will recommend vaccines for you based on your age, medical history, and lifestyle or other factors, such as travel or where you work. What tests do I need? Screening Your health care provider may recommend screening tests for certain conditions. This may include: Lipid and cholesterol levels. Diabetes screening. This is done by checking your blood sugar (glucose) after you have not eaten for a while (fasting). Pelvic exam and Pap test. Hepatitis B test. Hepatitis C  test. HIV (human immunodeficiency virus) test. STI (sexually transmitted infection) testing, if you are at risk. Lung cancer screening. Colorectal cancer screening. Mammogram. Talk with your health care provider about when you should start having regular mammograms. This may depend on whether you have a family history of breast cancer. BRCA-related cancer screening. This may be done if you have a family history of breast, ovarian, tubal, or peritoneal cancers. Bone density scan. This is done to screen for osteoporosis. Talk with your health care provider about your test results, treatment options, and if necessary, the need for more tests. Follow these instructions at home: Eating and drinking  Eat a diet that includes fresh fruits and vegetables, whole grains, lean protein, and low-fat dairy products. Take vitamin and mineral supplements as recommended by your health care provider. Do not drink alcohol if: Your health care provider tells you not to drink. You are pregnant, may be pregnant, or are planning to become pregnant. If you drink alcohol: Limit how much you have to 0-1 drink a day. Know how much alcohol is in your drink. In the [place] one drink equals one 12 oz bottle of beer (355 mL), one 5 oz glass of wine (148 mL), or one 1 oz glass of hard liquor (44 mL). Lifestyle Brush your teeth every morning and night with fluoride toothpaste. Floss one time each day. Exercise for at least 30 minutes 5 or more days each week. Do not use any products that contain nicotine or tobacco. These products include cigarettes, chewing tobacco, and vaping devices, such as e-cigarettes. If you need help quitting, ask your health care provider. Do not use drugs. If you are sexually active, practice safe sex. Use a condom or other form of protection to   prevent STIs. If you do not wish to become pregnant, use a form of birth control. If you plan to become pregnant, see your health care provider for a  prepregnancy visit. Take aspirin only as told by your health care provider. Make sure that you understand how much to take and what form to take. Work with your health care provider to find out whether it is safe and beneficial for you to take aspirin daily. Find healthy ways to manage stress, such as: Meditation, yoga, or listening to music. Journaling. Talking to a trusted person. Spending time with friends and family. Minimize exposure to UV radiation to reduce your risk of skin cancer. Safety Always wear your seat belt while driving or riding in a vehicle. Do not drive: If you have been drinking alcohol. Do not ride with someone who has been drinking. When you are tired or distracted. While texting. If you have been using any mind-altering substances or drugs. Wear a helmet and other protective equipment during sports activities. If you have firearms in your house, make sure you follow all gun safety procedures. Seek help if you have been physically or sexually abused. What's next? Visit your health care provider once a year for an annual wellness visit. Ask your health care provider how often you should have your eyes and teeth checked. Stay up to date on all vaccines. This information is not intended to replace advice given to you by your health care provider. Make sure you discuss any questions you have with your health care provider. Document Revised: 01/08/2021 Document Reviewed: 01/08/2021 Elsevier Patient Education  2024 [employer]  
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
## 2023-06-25 ENCOUNTER — Ambulatory Visit (INDEPENDENT_AMBULATORY_CARE_PROVIDER_SITE_OTHER): Payer: BC Managed Care – PPO

## 2023-06-25 DIAGNOSIS — I495 Sick sinus syndrome: Secondary | ICD-10-CM | POA: Diagnosis not present

## 2023-06-25 LAB — CUP PACEART REMOTE DEVICE CHECK
Battery Remaining Longevity: 12 mo
Battery Voltage: 2.87 V
Brady Statistic AP VP Percent: 0.05 %
Brady Statistic AP VS Percent: 61.31 %
Brady Statistic AS VP Percent: 0.01 %
Brady Statistic AS VS Percent: 38.62 %
Brady Statistic RA Percent Paced: 61.32 %
Brady Statistic RV Percent Paced: 0.06 %
Date Time Interrogation Session: 20241129031705
HighPow Impedance: 72 Ohm
Implantable Lead Connection Status: 753985
Implantable Lead Connection Status: 753985
Implantable Lead Implant Date: 20160804
Implantable Lead Implant Date: 20160804
Implantable Lead Location: 753859
Implantable Lead Location: 753860
Implantable Lead Model: 5076
Implantable Pulse Generator Implant Date: 20160804
Lead Channel Impedance Value: 418 Ohm
Lead Channel Impedance Value: 418 Ohm
Lead Channel Impedance Value: 513 Ohm
Lead Channel Pacing Threshold Amplitude: 0.625 V
Lead Channel Pacing Threshold Amplitude: 0.75 V
Lead Channel Pacing Threshold Pulse Width: 0.4 ms
Lead Channel Pacing Threshold Pulse Width: 0.4 ms
Lead Channel Sensing Intrinsic Amplitude: 1.25 mV
Lead Channel Sensing Intrinsic Amplitude: 1.25 mV
Lead Channel Sensing Intrinsic Amplitude: 7.625 mV
Lead Channel Sensing Intrinsic Amplitude: 7.625 mV
Lead Channel Setting Pacing Amplitude: 1.5 V
Lead Channel Setting Pacing Amplitude: 2 V
Lead Channel Setting Pacing Pulse Width: 0.4 ms
Lead Channel Setting Sensing Sensitivity: 0.3 mV
Zone Setting Status: 755011
Zone Setting Status: 755011

## 2023-07-16 NOTE — Progress Notes (Signed)
Remote ICD transmission.   

## 2023-09-24 ENCOUNTER — Ambulatory Visit: Payer: No Typology Code available for payment source

## 2023-09-28 ENCOUNTER — Ambulatory Visit (INDEPENDENT_AMBULATORY_CARE_PROVIDER_SITE_OTHER): Payer: Self-pay

## 2023-09-28 DIAGNOSIS — I495 Sick sinus syndrome: Secondary | ICD-10-CM | POA: Diagnosis not present

## 2023-09-30 ENCOUNTER — Encounter: Payer: Self-pay | Admitting: Internal Medicine

## 2023-09-30 LAB — CUP PACEART REMOTE DEVICE CHECK
Battery Remaining Longevity: 8 mo
Battery Voltage: 2.85 V
Brady Statistic AP VP Percent: 0.03 %
Brady Statistic AP VS Percent: 62.6 %
Brady Statistic AS VP Percent: 0.02 %
Brady Statistic AS VS Percent: 37.35 %
Brady Statistic RA Percent Paced: 62.61 %
Brady Statistic RV Percent Paced: 0.05 %
Date Time Interrogation Session: 20250304033326
HighPow Impedance: 72 Ohm
Implantable Lead Connection Status: 753985
Implantable Lead Connection Status: 753985
Implantable Lead Implant Date: 20160804
Implantable Lead Implant Date: 20160804
Implantable Lead Location: 753859
Implantable Lead Location: 753860
Implantable Lead Model: 5076
Implantable Pulse Generator Implant Date: 20160804
Lead Channel Impedance Value: 418 Ohm
Lead Channel Impedance Value: 418 Ohm
Lead Channel Impedance Value: 513 Ohm
Lead Channel Pacing Threshold Amplitude: 0.5 V
Lead Channel Pacing Threshold Amplitude: 0.75 V
Lead Channel Pacing Threshold Pulse Width: 0.4 ms
Lead Channel Pacing Threshold Pulse Width: 0.4 ms
Lead Channel Sensing Intrinsic Amplitude: 1 mV
Lead Channel Sensing Intrinsic Amplitude: 1 mV
Lead Channel Sensing Intrinsic Amplitude: 8.25 mV
Lead Channel Sensing Intrinsic Amplitude: 8.25 mV
Lead Channel Setting Pacing Amplitude: 1.5 V
Lead Channel Setting Pacing Amplitude: 2 V
Lead Channel Setting Pacing Pulse Width: 0.4 ms
Lead Channel Setting Sensing Sensitivity: 0.3 mV
Zone Setting Status: 755011
Zone Setting Status: 755011

## 2023-10-29 DIAGNOSIS — L814 Other melanin hyperpigmentation: Secondary | ICD-10-CM | POA: Diagnosis not present

## 2023-10-29 DIAGNOSIS — C44612 Basal cell carcinoma of skin of right upper limb, including shoulder: Secondary | ICD-10-CM | POA: Diagnosis not present

## 2023-10-29 DIAGNOSIS — D225 Melanocytic nevi of trunk: Secondary | ICD-10-CM | POA: Diagnosis not present

## 2023-10-29 DIAGNOSIS — L820 Inflamed seborrheic keratosis: Secondary | ICD-10-CM | POA: Diagnosis not present

## 2023-10-29 DIAGNOSIS — L918 Other hypertrophic disorders of the skin: Secondary | ICD-10-CM | POA: Diagnosis not present

## 2023-11-01 NOTE — Progress Notes (Signed)
 Remote ICD transmission.

## 2023-11-01 NOTE — Addendum Note (Signed)
 Addended by: Geralyn Flash D on: 11/01/2023 02:42 PM   Modules accepted: Orders

## 2023-11-26 ENCOUNTER — Encounter: Payer: BC Managed Care – PPO | Admitting: Pulmonary Disease

## 2023-12-01 ENCOUNTER — Encounter: Payer: Self-pay | Admitting: Internal Medicine

## 2023-12-01 ENCOUNTER — Ambulatory Visit: Payer: BC Managed Care – PPO | Attending: Internal Medicine | Admitting: Internal Medicine

## 2023-12-01 VITALS — BP 126/70 | HR 68 | Ht 62.0 in | Wt 153.8 lb

## 2023-12-01 DIAGNOSIS — I4729 Other ventricular tachycardia: Secondary | ICD-10-CM

## 2023-12-01 DIAGNOSIS — Z01810 Encounter for preprocedural cardiovascular examination: Secondary | ICD-10-CM | POA: Diagnosis not present

## 2023-12-01 LAB — CUP PACEART INCLINIC DEVICE CHECK
Date Time Interrogation Session: 20250507100440
Implantable Lead Connection Status: 753985
Implantable Lead Connection Status: 753985
Implantable Lead Implant Date: 20160804
Implantable Lead Implant Date: 20160804
Implantable Lead Location: 753859
Implantable Lead Location: 753860
Implantable Lead Model: 5076
Implantable Pulse Generator Implant Date: 20160804

## 2023-12-01 NOTE — Patient Instructions (Signed)
 Medication Instructions:  Your physician recommends that you continue on your current medications as directed. Please refer to the Current Medication list given to you today.  *If you need a refill on your cardiac medications before your next appointment, please call your pharmacy*  Lab Work: None ordered.  You may go to any [facility] Location for your lab work:  [facility] - [address] ([facility]) - [address] [id number]  [place] - [address]   [place]  - [address]   [place] - [address] - [address] [employer]  - [address] ([facility]   If you have labs (blood work) drawn today and your tests are completely normal, you will receive your results only by: [employer] (if you have MyChart)  If you have any lab test that is abnormal or we need to change your treatment, we will call you or send a MyChart message to review the results.  Testing/Procedures: None ordered.  Follow-Up: At [facility], you and your health needs are our priority.  As part of our continuing mission to provide you with exceptional heart care, we have created designated Provider Care Teams.  These Care Teams include your primary Cardiologist (physician) and Advanced Practice Providers (APPs -  Physician Assistants and Nurse Practitioners) who all work together to provide you with the care you need, when you need it.  Your next appointment:   1 year(s)  The format for your next appointment:   In Person  Provider:   Manya Sells, MD{or one of the following Advanced Practice Providers on your designated Care Team:   Mertha Abrahams, [place] Bambi Lever "Jonelle Neri" [place], [place] Neda Balk, NP  Note: Remote monitoring is used to monitor your Pacemaker/ ICD from home. This monitoring reduces the number of office visits required to check your  device to one time per year. It allows us  to keep an eye on the functioning of your device to ensure it is working properly.
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
## 2023-12-22 ENCOUNTER — Encounter: Payer: Self-pay | Admitting: Family Medicine

## 2023-12-22 ENCOUNTER — Ambulatory Visit (INDEPENDENT_AMBULATORY_CARE_PROVIDER_SITE_OTHER): Admitting: Family Medicine

## 2023-12-22 VITALS — BP 128/76 | HR 96 | Temp 98.0°F | Resp 16 | Ht 62.0 in | Wt 153.8 lb

## 2023-12-22 DIAGNOSIS — L282 Other prurigo: Secondary | ICD-10-CM | POA: Diagnosis not present

## 2023-12-22 MED ORDER — TRIAMCINOLONE ACETONIDE 0.1 % EX CREA: 1.0000 | TOPICAL_CREAM | Freq: Two times a day (BID) | CUTANEOUS | 0 refills | Status: DC
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
## 2023-12-22 NOTE — Patient Instructions (Signed)
Try not to scratch as this can make things worse. Avoid scented products while dealing with this. You may resume when the itchiness resolves. Cold/cool compresses can help.   Claritin (loratadine), Allegra (fexofenadine), Zyrtec (cetirizine) which is also equivalent to Xyzal (levocetirizine); these are listed in order from weakest to strongest. Generic, and therefore cheaper, options are in the parentheses.   There are available OTC, and the generic versions, which may be cheaper, are in parentheses. Show this to a pharmacist if you have trouble finding any of these items.  Let us know if you need anything.

## 2023-12-28 ENCOUNTER — Ambulatory Visit (INDEPENDENT_AMBULATORY_CARE_PROVIDER_SITE_OTHER): Payer: No Typology Code available for payment source

## 2023-12-28 DIAGNOSIS — I495 Sick sinus syndrome: Secondary | ICD-10-CM | POA: Diagnosis not present

## 2023-12-28 LAB — CUP PACEART REMOTE DEVICE CHECK
Battery Remaining Longevity: 6 mo
Battery Voltage: 2.83 V
Brady Statistic AP VP Percent: 0.04 %
Brady Statistic AP VS Percent: 64.25 %
Brady Statistic AS VP Percent: 0.01 %
Brady Statistic AS VS Percent: 35.7 %
Brady Statistic RA Percent Paced: 64.27 %
Brady Statistic RV Percent Paced: 0.05 %
Date Time Interrogation Session: 20250603033322
HighPow Impedance: 72 Ohm
Implantable Lead Connection Status: 753985
Implantable Lead Connection Status: 753985
Implantable Lead Implant Date: 20160804
Implantable Lead Implant Date: 20160804
Implantable Lead Location: 753859
Implantable Lead Location: 753860
Implantable Lead Model: 5076
Implantable Pulse Generator Implant Date: 20160804
Lead Channel Impedance Value: 399 Ohm
Lead Channel Impedance Value: 418 Ohm
Lead Channel Impedance Value: 475 Ohm
Lead Channel Pacing Threshold Amplitude: 0.625 V
Lead Channel Pacing Threshold Amplitude: 0.625 V
Lead Channel Pacing Threshold Pulse Width: 0.4 ms
Lead Channel Pacing Threshold Pulse Width: 0.4 ms
Lead Channel Sensing Intrinsic Amplitude: 1 mV
Lead Channel Sensing Intrinsic Amplitude: 1 mV
Lead Channel Sensing Intrinsic Amplitude: 7.875 mV
Lead Channel Sensing Intrinsic Amplitude: 7.875 mV
Lead Channel Setting Pacing Amplitude: 1.5 V
Lead Channel Setting Pacing Amplitude: 2 V
Lead Channel Setting Pacing Pulse Width: 0.4 ms
Lead Channel Setting Sensing Sensitivity: 0.3 mV
Zone Setting Status: 755011
Zone Setting Status: 755011

## 2023-12-30 ENCOUNTER — Ambulatory Visit: Payer: Self-pay | Admitting: Internal Medicine

## 2024-01-13 ENCOUNTER — Other Ambulatory Visit: Payer: Self-pay | Admitting: Internal Medicine

## 2024-01-28 ENCOUNTER — Ambulatory Visit

## 2024-02-01 ENCOUNTER — Encounter: Payer: Self-pay | Admitting: Internal Medicine

## 2024-02-07 ENCOUNTER — Ambulatory Visit: Payer: Self-pay | Admitting: Internal Medicine

## 2024-02-07 LAB — CUP PACEART REMOTE DEVICE CHECK
Battery Remaining Longevity: 6 mo
Battery Voltage: 2.82 V
Brady Statistic AP VP Percent: 0.06 %
Brady Statistic AP VS Percent: 66.69 %
Brady Statistic AS VP Percent: 0.01 %
Brady Statistic AS VS Percent: 33.23 %
Brady Statistic RA Percent Paced: 66.74 %
Brady Statistic RV Percent Paced: 0.07 %
Date Time Interrogation Session: 20250711161955
HighPow Impedance: 69 Ohm
Implantable Lead Connection Status: 753985
Implantable Lead Connection Status: 753985
Implantable Lead Implant Date: 20160804
Implantable Lead Implant Date: 20160804
Implantable Lead Location: 753859
Implantable Lead Location: 753860
Implantable Lead Model: 5076
Implantable Pulse Generator Implant Date: 20160804
Lead Channel Impedance Value: 418 Ohm
Lead Channel Impedance Value: 475 Ohm
Lead Channel Impedance Value: 551 Ohm
Lead Channel Pacing Threshold Amplitude: 0.5 V
Lead Channel Pacing Threshold Amplitude: 0.75 V
Lead Channel Pacing Threshold Pulse Width: 0.4 ms
Lead Channel Pacing Threshold Pulse Width: 0.4 ms
Lead Channel Sensing Intrinsic Amplitude: 1.375 mV
Lead Channel Sensing Intrinsic Amplitude: 1.375 mV
Lead Channel Sensing Intrinsic Amplitude: 8.25 mV
Lead Channel Sensing Intrinsic Amplitude: 8.25 mV
Lead Channel Setting Pacing Amplitude: 1.5 V
Lead Channel Setting Pacing Amplitude: 2 V
Lead Channel Setting Pacing Pulse Width: 0.4 ms
Lead Channel Setting Sensing Sensitivity: 0.3 mV
Zone Setting Status: 755011
Zone Setting Status: 755011

## 2024-02-22 NOTE — Progress Notes (Signed)
 Remote ICD transmission.

## 2024-02-28 ENCOUNTER — Ambulatory Visit

## 2024-02-28 DIAGNOSIS — I495 Sick sinus syndrome: Secondary | ICD-10-CM

## 2024-02-29 ENCOUNTER — Encounter: Payer: Self-pay | Admitting: Internal Medicine

## 2024-03-06 ENCOUNTER — Ambulatory Visit: Payer: Self-pay | Admitting: Internal Medicine

## 2024-03-06 LAB — CUP PACEART REMOTE DEVICE CHECK
Battery Remaining Longevity: 5 mo
Battery Voltage: 2.82 V
Brady Statistic AP VP Percent: 0.06 %
Brady Statistic AP VS Percent: 68.35 %
Brady Statistic AS VP Percent: 0.01 %
Brady Statistic AS VS Percent: 31.58 %
Brady Statistic RA Percent Paced: 68.4 %
Brady Statistic RV Percent Paced: 0.07 %
Date Time Interrogation Session: 20250809145514
HighPow Impedance: 72 Ohm
Implantable Lead Connection Status: 753985
Implantable Lead Connection Status: 753985
Implantable Lead Implant Date: 20160804
Implantable Lead Implant Date: 20160804
Implantable Lead Location: 753859
Implantable Lead Location: 753860
Implantable Lead Model: 5076
Implantable Pulse Generator Implant Date: 20160804
Lead Channel Impedance Value: 456 Ohm
Lead Channel Impedance Value: 456 Ohm
Lead Channel Impedance Value: 589 Ohm
Lead Channel Pacing Threshold Amplitude: 0.5 V
Lead Channel Pacing Threshold Amplitude: 0.75 V
Lead Channel Pacing Threshold Pulse Width: 0.4 ms
Lead Channel Pacing Threshold Pulse Width: 0.4 ms
Lead Channel Sensing Intrinsic Amplitude: 1.625 mV
Lead Channel Sensing Intrinsic Amplitude: 1.625 mV
Lead Channel Sensing Intrinsic Amplitude: 8.125 mV
Lead Channel Sensing Intrinsic Amplitude: 8.125 mV
Lead Channel Setting Pacing Amplitude: 1.5 V
Lead Channel Setting Pacing Amplitude: 2 V
Lead Channel Setting Pacing Pulse Width: 0.4 ms
Lead Channel Setting Sensing Sensitivity: 0.3 mV
Zone Setting Status: 755011
Zone Setting Status: 755011

## 2024-03-11 DIAGNOSIS — M7741 Metatarsalgia, right foot: Secondary | ICD-10-CM | POA: Diagnosis not present

## 2024-03-12 ENCOUNTER — Encounter: Payer: Self-pay | Admitting: Family Medicine

## 2024-03-28 ENCOUNTER — Ambulatory Visit: Payer: No Typology Code available for payment source

## 2024-03-30 ENCOUNTER — Ambulatory Visit (INDEPENDENT_AMBULATORY_CARE_PROVIDER_SITE_OTHER)

## 2024-03-30 DIAGNOSIS — I495 Sick sinus syndrome: Secondary | ICD-10-CM | POA: Diagnosis not present

## 2024-03-31 LAB — CUP PACEART REMOTE DEVICE CHECK
Battery Remaining Longevity: 1 mo
Battery Voltage: 2.8 V
Brady Statistic AP VP Percent: 0.05 %
Brady Statistic AP VS Percent: 66.79 %
Brady Statistic AS VP Percent: 0.01 %
Brady Statistic AS VS Percent: 33.16 %
Brady Statistic RA Percent Paced: 66.81 %
Brady Statistic RV Percent Paced: 0.06 %
Date Time Interrogation Session: 20250905073625
HighPow Impedance: 70 Ohm
Implantable Lead Connection Status: 753985
Implantable Lead Connection Status: 753985
Implantable Lead Implant Date: 20160804
Implantable Lead Implant Date: 20160804
Implantable Lead Location: 753859
Implantable Lead Location: 753860
Implantable Lead Model: 5076
Implantable Pulse Generator Implant Date: 20160804
Lead Channel Impedance Value: 418 Ohm
Lead Channel Impedance Value: 456 Ohm
Lead Channel Impedance Value: 532 Ohm
Lead Channel Pacing Threshold Amplitude: 0.5 V
Lead Channel Pacing Threshold Amplitude: 0.75 V
Lead Channel Pacing Threshold Pulse Width: 0.4 ms
Lead Channel Pacing Threshold Pulse Width: 0.4 ms
Lead Channel Sensing Intrinsic Amplitude: 1.375 mV
Lead Channel Sensing Intrinsic Amplitude: 1.375 mV
Lead Channel Sensing Intrinsic Amplitude: 8.125 mV
Lead Channel Sensing Intrinsic Amplitude: 8.125 mV
Lead Channel Setting Pacing Amplitude: 1.5 V
Lead Channel Setting Pacing Amplitude: 2 V
Lead Channel Setting Pacing Pulse Width: 0.4 ms
Lead Channel Setting Sensing Sensitivity: 0.3 mV
Zone Setting Status: 755011
Zone Setting Status: 755011

## 2024-04-01 ENCOUNTER — Encounter: Payer: Self-pay | Admitting: Internal Medicine

## 2024-04-02 ENCOUNTER — Ambulatory Visit: Payer: Self-pay | Admitting: Internal Medicine

## 2024-04-08 NOTE — Progress Notes (Signed)
Remote ICD Transmission.

## 2024-04-10 ENCOUNTER — Other Ambulatory Visit: Payer: Self-pay | Admitting: Internal Medicine

## 2024-05-01 ENCOUNTER — Ambulatory Visit

## 2024-05-02 ENCOUNTER — Encounter: Payer: Self-pay | Admitting: Internal Medicine

## 2024-05-03 LAB — CUP PACEART REMOTE DEVICE CHECK
Battery Remaining Longevity: 3 mo
Battery Voltage: 2.8 V
Brady Statistic AP VP Percent: 0.04 %
Brady Statistic AP VS Percent: 58.25 %
Brady Statistic AS VP Percent: 0.01 %
Brady Statistic AS VS Percent: 41.7 %
Brady Statistic RA Percent Paced: 58.27 %
Brady Statistic RV Percent Paced: 0.05 %
Date Time Interrogation Session: 20251006001804
HighPow Impedance: 71 Ohm
Implantable Lead Connection Status: 753985
Implantable Lead Connection Status: 753985
Implantable Lead Implant Date: 20160804
Implantable Lead Implant Date: 20160804
Implantable Lead Location: 753859
Implantable Lead Location: 753860
Implantable Lead Model: 5076
Implantable Pulse Generator Implant Date: 20160804
Lead Channel Impedance Value: 418 Ohm
Lead Channel Impedance Value: 475 Ohm
Lead Channel Impedance Value: 551 Ohm
Lead Channel Pacing Threshold Amplitude: 0.5 V
Lead Channel Pacing Threshold Amplitude: 0.75 V
Lead Channel Pacing Threshold Pulse Width: 0.4 ms
Lead Channel Pacing Threshold Pulse Width: 0.4 ms
Lead Channel Sensing Intrinsic Amplitude: 1.25 mV
Lead Channel Sensing Intrinsic Amplitude: 1.25 mV
Lead Channel Sensing Intrinsic Amplitude: 9 mV
Lead Channel Sensing Intrinsic Amplitude: 9 mV
Lead Channel Setting Pacing Amplitude: 1.5 V
Lead Channel Setting Pacing Amplitude: 2 V
Lead Channel Setting Pacing Pulse Width: 0.4 ms
Lead Channel Setting Sensing Sensitivity: 0.3 mV
Zone Setting Status: 755011
Zone Setting Status: 755011

## 2024-05-04 ENCOUNTER — Ambulatory Visit: Payer: Self-pay | Admitting: Internal Medicine

## 2024-05-16 DIAGNOSIS — Z1231 Encounter for screening mammogram for malignant neoplasm of breast: Secondary | ICD-10-CM | POA: Diagnosis not present

## 2024-05-16 DIAGNOSIS — Z01419 Encounter for gynecological examination (general) (routine) without abnormal findings: Secondary | ICD-10-CM | POA: Diagnosis not present

## 2024-05-16 DIAGNOSIS — Z124 Encounter for screening for malignant neoplasm of cervix: Secondary | ICD-10-CM | POA: Diagnosis not present

## 2024-05-22 ENCOUNTER — Encounter: Payer: Self-pay | Admitting: Family Medicine

## 2024-05-22 ENCOUNTER — Other Ambulatory Visit: Payer: Self-pay

## 2024-05-22 ENCOUNTER — Ambulatory Visit: Payer: BC Managed Care – PPO | Admitting: Family Medicine

## 2024-05-22 VITALS — BP 120/86 | HR 64 | Temp 97.9°F | Resp 16 | Ht 62.0 in | Wt 152.2 lb

## 2024-05-22 DIAGNOSIS — M2550 Pain in unspecified joint: Secondary | ICD-10-CM | POA: Diagnosis not present

## 2024-05-22 DIAGNOSIS — R928 Other abnormal and inconclusive findings on diagnostic imaging of breast: Secondary | ICD-10-CM

## 2024-05-22 DIAGNOSIS — Z23 Encounter for immunization: Secondary | ICD-10-CM

## 2024-05-22 DIAGNOSIS — G8929 Other chronic pain: Secondary | ICD-10-CM

## 2024-05-22 DIAGNOSIS — E559 Vitamin D deficiency, unspecified: Secondary | ICD-10-CM

## 2024-05-22 DIAGNOSIS — Z13220 Encounter for screening for lipoid disorders: Secondary | ICD-10-CM

## 2024-05-22 DIAGNOSIS — Z95810 Presence of automatic (implantable) cardiac defibrillator: Secondary | ICD-10-CM

## 2024-05-22 DIAGNOSIS — Z0000 Encounter for general adult medical examination without abnormal findings: Secondary | ICD-10-CM | POA: Diagnosis not present

## 2024-05-22 LAB — LIPID PANEL
Cholesterol: 208 mg/dL — ABNORMAL HIGH (ref 0–200)
HDL: 41.3 mg/dL (ref >39.00)
LDL Cholesterol: 140 mg/dL — ABNORMAL HIGH (ref 0–99)
NonHDL: 166.67
Total CHOL/HDL Ratio: 5
Triglycerides: 135 mg/dL (ref 0.0–149.0)
VLDL: 27 mg/dL (ref 0.0–40.0)

## 2024-05-22 LAB — CBC WITH DIFFERENTIAL/PLATELET
Basophils Absolute: 0.1 K/uL (ref 0.0–0.1)
Basophils Relative: 0.8 % (ref 0.0–3.0)
Eosinophils Absolute: 0.3 K/uL (ref 0.0–0.7)
Eosinophils Relative: 4.5 % (ref 0.0–5.0)
HCT: 45.4 % (ref 36.0–46.0)
Hemoglobin: 14.9 g/dL (ref 12.0–15.0)
Lymphocytes Relative: 33.9 % (ref 12.0–46.0)
Lymphs Abs: 2.4 K/uL (ref 0.7–4.0)
MCHC: 32.9 g/dL (ref 30.0–36.0)
MCV: 84.7 fl (ref 78.0–100.0)
Monocytes Absolute: 0.5 K/uL (ref 0.1–1.0)
Monocytes Relative: 7.4 % (ref 3.0–12.0)
Neutro Abs: 3.8 K/uL (ref 1.4–7.7)
Neutrophils Relative %: 53.4 % (ref 43.0–77.0)
Platelets: 252 K/uL (ref 150.0–400.0)
RBC: 5.36 Mil/uL — ABNORMAL HIGH (ref 3.87–5.11)
RDW: 13.4 % (ref 11.5–15.5)
WBC: 7.2 K/uL (ref 4.0–10.5)

## 2024-05-22 LAB — COMPREHENSIVE METABOLIC PANEL WITH GFR
ALT: 35 U/L (ref 0–35)
AST: 25 U/L (ref 0–37)
Albumin: 4.6 g/dL (ref 3.5–5.2)
Alkaline Phosphatase: 118 U/L — ABNORMAL HIGH (ref 39–117)
BUN: 12 mg/dL (ref 6–23)
CO2: 28 meq/L (ref 19–32)
Calcium: 9.9 mg/dL (ref 8.4–10.5)
Chloride: 99 meq/L (ref 96–112)
Creatinine, Ser: 0.64 mg/dL (ref 0.40–1.20)
GFR: 97.6 mL/min (ref >60.00)
Glucose, Bld: 90 mg/dL (ref 70–99)
Potassium: 4.4 meq/L (ref 3.5–5.1)
Sodium: 135 meq/L (ref 135–145)
Total Bilirubin: 0.6 mg/dL (ref 0.2–1.2)
Total Protein: 7 g/dL (ref 6.0–8.3)

## 2024-05-22 LAB — TSH: TSH: 1 u[IU]/mL (ref 0.35–5.50)

## 2024-05-22 LAB — VITAMIN B12: Vitamin B-12: 631 pg/mL (ref 211–911)

## 2024-05-22 LAB — VITAMIN D 25 HYDROXY (VIT D DEFICIENCY, FRACTURES): VITD: 95.71 ng/mL (ref 30.00–100.00)
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
## 2024-05-22 NOTE — Assessment & Plan Note (Signed)
 Per cardiology

## 2024-05-22 NOTE — Assessment & Plan Note (Signed)
 Ghm utd Check labs  See AVS  Health Maintenance  Topic Date Due   Hepatitis B Vaccines 19-59 Average Risk (1 of 3 - 19+ 3-dose series) Never done   Cervical Cancer Screening (Pap smear)  05/01/2014   DTaP/Tdap/Td (2 - Td or Tdap) 04/19/2022   COVID-19 Vaccine (1) 06/07/2024 (Originally 03/29/1971)   Zoster Vaccines- Shingrix (1 of 2) 08/22/2024 (Originally 03/28/1985)   Influenza Vaccine  10/24/2024 (Originally 02/25/2024)   Pneumococcal Vaccine: 50+ Years (1 of 1 - PCV) 05/22/2025 (Originally 03/28/2016)   Mammogram  05/16/2025   Colonoscopy  12/07/2025   Hepatitis C Screening  Completed   HIV Screening  Completed   HPV VACCINES  Aged Out   Meningococcal B Vaccine  Aged Out

## 2024-05-22 NOTE — Assessment & Plan Note (Signed)
Check labs Consider rheum referral 

## 2024-05-24 ENCOUNTER — Ambulatory Visit: Payer: Self-pay | Admitting: Family Medicine

## 2024-05-24 LAB — ROCKY MTN SPOTTED FVR ABS PNL(IGG+IGM)
RMSF IgG: NOT DETECTED
RMSF IgM: NOT DETECTED

## 2024-05-24 LAB — ANA: Anti Nuclear Antibody (ANA): NEGATIVE

## 2024-05-24 LAB — RHEUMATOID FACTOR: Rheumatoid fact SerPl-aCnc: <10 [IU]/mL (ref <14)

## 2024-05-24 NOTE — Addendum Note (Signed)
 Addended by: VICCI SELLER A on: 05/24/2024 01:11 PM   Modules accepted: Orders, Level of Service

## 2024-05-24 NOTE — Progress Notes (Signed)
 Remote ICD transmission.

## 2024-05-29 ENCOUNTER — Encounter: Payer: Self-pay | Admitting: Family Medicine

## 2024-05-29 DIAGNOSIS — G8929 Other chronic pain: Secondary | ICD-10-CM

## 2024-05-31 ENCOUNTER — Other Ambulatory Visit (INDEPENDENT_AMBULATORY_CARE_PROVIDER_SITE_OTHER)

## 2024-05-31 DIAGNOSIS — M2550 Pain in unspecified joint: Secondary | ICD-10-CM | POA: Diagnosis not present

## 2024-05-31 DIAGNOSIS — G8929 Other chronic pain: Secondary | ICD-10-CM | POA: Diagnosis not present

## 2024-06-01 ENCOUNTER — Ambulatory Visit

## 2024-06-01 ENCOUNTER — Ambulatory Visit: Admission: RE | Admit: 2024-06-01 | Discharge: 2024-06-01 | Disposition: A | Source: Ambulatory Visit

## 2024-06-01 DIAGNOSIS — R928 Other abnormal and inconclusive findings on diagnostic imaging of breast: Secondary | ICD-10-CM | POA: Diagnosis not present

## 2024-06-01 LAB — CUP PACEART REMOTE DEVICE CHECK
Battery Remaining Longevity: 2 mo
Battery Voltage: 2.8 V
Brady Statistic AP VP Percent: 0.05 %
Brady Statistic AP VS Percent: 66.07 %
Brady Statistic AS VP Percent: 0.01 %
Brady Statistic AS VS Percent: 33.87 %
Brady Statistic RA Percent Paced: 66.11 %
Brady Statistic RV Percent Paced: 0.06 %
Date Time Interrogation Session: 20251106001702
HighPow Impedance: 72 Ohm
Implantable Lead Connection Status: 753985
Implantable Lead Connection Status: 753985
Implantable Lead Implant Date: 20160804
Implantable Lead Implant Date: 20160804
Implantable Lead Location: 753859
Implantable Lead Location: 753860
Implantable Lead Model: 5076
Implantable Pulse Generator Implant Date: 20160804
Lead Channel Impedance Value: 456 Ohm
Lead Channel Impedance Value: 513 Ohm
Lead Channel Impedance Value: 589 Ohm
Lead Channel Pacing Threshold Amplitude: 0.5 V
Lead Channel Pacing Threshold Amplitude: 0.75 V
Lead Channel Pacing Threshold Pulse Width: 0.4 ms
Lead Channel Pacing Threshold Pulse Width: 0.4 ms
Lead Channel Sensing Intrinsic Amplitude: 1.25 mV
Lead Channel Sensing Intrinsic Amplitude: 1.25 mV
Lead Channel Sensing Intrinsic Amplitude: 9.125 mV
Lead Channel Sensing Intrinsic Amplitude: 9.125 mV
Lead Channel Setting Pacing Amplitude: 1.5 V
Lead Channel Setting Pacing Amplitude: 2 V
Lead Channel Setting Pacing Pulse Width: 0.4 ms
Lead Channel Setting Sensing Sensitivity: 0.3 mV
Zone Setting Status: 755011
Zone Setting Status: 755011

## 2024-06-01 LAB — LYME DISEASE SEROLOGY W/REFLEX: Lyme Total Antibody EIA: NEGATIVE

## 2024-06-02 ENCOUNTER — Ambulatory Visit: Payer: Self-pay | Admitting: Internal Medicine

## 2024-06-11 ENCOUNTER — Ambulatory Visit: Payer: Self-pay | Admitting: Family Medicine

## 2024-06-20 ENCOUNTER — Encounter: Payer: Self-pay | Admitting: Internal Medicine

## 2024-06-27 ENCOUNTER — Ambulatory Visit: Payer: No Typology Code available for payment source

## 2024-07-02 ENCOUNTER — Ambulatory Visit

## 2024-07-02 DIAGNOSIS — I495 Sick sinus syndrome: Secondary | ICD-10-CM

## 2024-07-03 ENCOUNTER — Ambulatory Visit

## 2024-07-04 LAB — CUP PACEART REMOTE DEVICE CHECK
Battery Remaining Longevity: 1 mo
Battery Voltage: 2.78 V
Brady Statistic AP VP Percent: 0.04 %
Brady Statistic AP VS Percent: 61.33 %
Brady Statistic AS VP Percent: 0.02 %
Brady Statistic AS VS Percent: 38.61 %
Brady Statistic RA Percent Paced: 61.36 %
Brady Statistic RV Percent Paced: 0.06 %
Date Time Interrogation Session: 20251207074225
HighPow Impedance: 79 Ohm
Implantable Lead Connection Status: 753985
Implantable Lead Connection Status: 753985
Implantable Lead Implant Date: 20160804
Implantable Lead Implant Date: 20160804
Implantable Lead Location: 753859
Implantable Lead Location: 753860
Implantable Lead Model: 5076
Implantable Pulse Generator Implant Date: 20160804
Lead Channel Impedance Value: 418 Ohm
Lead Channel Impedance Value: 418 Ohm
Lead Channel Impedance Value: 513 Ohm
Lead Channel Pacing Threshold Amplitude: 0.625 V
Lead Channel Pacing Threshold Amplitude: 0.875 V
Lead Channel Pacing Threshold Pulse Width: 0.4 ms
Lead Channel Pacing Threshold Pulse Width: 0.4 ms
Lead Channel Sensing Intrinsic Amplitude: 1.625 mV
Lead Channel Sensing Intrinsic Amplitude: 1.625 mV
Lead Channel Sensing Intrinsic Amplitude: 8.625 mV
Lead Channel Sensing Intrinsic Amplitude: 8.625 mV
Lead Channel Setting Pacing Amplitude: 1.5 V
Lead Channel Setting Pacing Amplitude: 2 V
Lead Channel Setting Pacing Pulse Width: 0.4 ms
Lead Channel Setting Sensing Sensitivity: 0.3 mV
Zone Setting Status: 755011
Zone Setting Status: 755011

## 2024-07-05 ENCOUNTER — Ambulatory Visit: Payer: Self-pay | Admitting: Internal Medicine

## 2024-07-06 NOTE — Progress Notes (Signed)
 Remote ICD Transmission

## 2024-08-02 ENCOUNTER — Ambulatory Visit: Payer: Self-pay

## 2024-08-03 ENCOUNTER — Ambulatory Visit

## 2024-08-03 LAB — CUP PACEART REMOTE DEVICE CHECK
Battery Remaining Longevity: 1 mo
Battery Voltage: 2.78 V
Brady Statistic AP VP Percent: 0.05 %
Brady Statistic AP VS Percent: 59.76 %
Brady Statistic AS VP Percent: 0.02 %
Brady Statistic AS VS Percent: 40.17 %
Brady Statistic RA Percent Paced: 59.79 %
Brady Statistic RV Percent Paced: 0.07 %
Date Time Interrogation Session: 20260107012304
HighPow Impedance: 77 Ohm
Implantable Lead Connection Status: 753985
Implantable Lead Connection Status: 753985
Implantable Lead Implant Date: 20160804
Implantable Lead Implant Date: 20160804
Implantable Lead Location: 753859
Implantable Lead Location: 753860
Implantable Lead Model: 5076
Implantable Pulse Generator Implant Date: 20160804
Lead Channel Impedance Value: 418 Ohm
Lead Channel Impedance Value: 551 Ohm
Lead Channel Impedance Value: 608 Ohm
Lead Channel Pacing Threshold Amplitude: 0.5 V
Lead Channel Pacing Threshold Amplitude: 0.75 V
Lead Channel Pacing Threshold Pulse Width: 0.4 ms
Lead Channel Pacing Threshold Pulse Width: 0.4 ms
Lead Channel Sensing Intrinsic Amplitude: 1.125 mV
Lead Channel Sensing Intrinsic Amplitude: 1.125 mV
Lead Channel Sensing Intrinsic Amplitude: 8 mV
Lead Channel Sensing Intrinsic Amplitude: 8 mV
Lead Channel Setting Pacing Amplitude: 1.5 V
Lead Channel Setting Pacing Amplitude: 2 V
Lead Channel Setting Pacing Pulse Width: 0.4 ms
Lead Channel Setting Sensing Sensitivity: 0.3 mV
Zone Setting Status: 755011
Zone Setting Status: 755011

## 2024-08-05 ENCOUNTER — Ambulatory Visit: Payer: Self-pay | Admitting: Cardiovascular Disease

## 2024-09-02 ENCOUNTER — Ambulatory Visit: Payer: Self-pay

## 2024-09-26 ENCOUNTER — Ambulatory Visit: Payer: No Typology Code available for payment source

## 2024-10-03 ENCOUNTER — Ambulatory Visit: Payer: Self-pay

## 2024-11-03 ENCOUNTER — Ambulatory Visit: Payer: Self-pay

## 2024-12-04 ENCOUNTER — Ambulatory Visit: Payer: Self-pay

## 2025-01-04 ENCOUNTER — Ambulatory Visit: Payer: Self-pay

## 2025-05-24 ENCOUNTER — Encounter: Admitting: Family Medicine
